# Patient Record
Sex: Female | Born: 1945 | Race: White | Hispanic: No | Marital: Married | Smoking: Current every day smoker
Health system: Southern US, Community
[De-identification: ages and names within clinical notes are randomized; demographics above are authoritative.]

## PROBLEM LIST (undated history)

## (undated) DIAGNOSIS — T7840XA Allergy, unspecified, initial encounter: Secondary | ICD-10-CM

## (undated) DIAGNOSIS — B019 Varicella without complication: Secondary | ICD-10-CM

## (undated) DIAGNOSIS — K529 Noninfective gastroenteritis and colitis, unspecified: Secondary | ICD-10-CM

## (undated) DIAGNOSIS — Z87440 Personal history of urinary (tract) infections: Secondary | ICD-10-CM

## (undated) DIAGNOSIS — E119 Type 2 diabetes mellitus without complications: Secondary | ICD-10-CM

## (undated) DIAGNOSIS — N8110 Cystocele, unspecified: Secondary | ICD-10-CM

## (undated) DIAGNOSIS — H269 Unspecified cataract: Secondary | ICD-10-CM

## (undated) DIAGNOSIS — M8580 Other specified disorders of bone density and structure, unspecified site: Secondary | ICD-10-CM

## (undated) DIAGNOSIS — E785 Hyperlipidemia, unspecified: Secondary | ICD-10-CM

## (undated) DIAGNOSIS — M810 Age-related osteoporosis without current pathological fracture: Secondary | ICD-10-CM

## (undated) DIAGNOSIS — E278 Other specified disorders of adrenal gland: Secondary | ICD-10-CM

## (undated) DIAGNOSIS — K5790 Diverticulosis of intestine, part unspecified, without perforation or abscess without bleeding: Secondary | ICD-10-CM

## (undated) DIAGNOSIS — J439 Emphysema, unspecified: Secondary | ICD-10-CM

## (undated) DIAGNOSIS — M1990 Unspecified osteoarthritis, unspecified site: Secondary | ICD-10-CM

## (undated) DIAGNOSIS — G709 Myoneural disorder, unspecified: Secondary | ICD-10-CM

## (undated) HISTORY — DX: Type 2 diabetes mellitus without complications: E11.9

## (undated) HISTORY — DX: Noninfective gastroenteritis and colitis, unspecified: K52.9

## (undated) HISTORY — DX: Age-related osteoporosis without current pathological fracture: M81.0

## (undated) HISTORY — DX: Unspecified cataract: H26.9

## (undated) HISTORY — DX: Unspecified osteoarthritis, unspecified site: M19.90

## (undated) HISTORY — DX: Allergy, unspecified, initial encounter: T78.40XA

## (undated) HISTORY — PX: POLYPECTOMY: SHX149

## (undated) HISTORY — PX: BREAST BIOPSY: SHX20

## (undated) HISTORY — DX: Emphysema, unspecified: J43.9

## (undated) HISTORY — DX: Other specified disorders of bone density and structure, unspecified site: M85.80

## (undated) HISTORY — DX: Hyperlipidemia, unspecified: E78.5

## (undated) HISTORY — DX: Diverticulosis of intestine, part unspecified, without perforation or abscess without bleeding: K57.90

## (undated) HISTORY — DX: Myoneural disorder, unspecified: G70.9

## (undated) HISTORY — DX: Other specified disorders of adrenal gland: E27.8

## (undated) HISTORY — PX: ABDOMINAL HYSTERECTOMY: SHX81

## (undated) HISTORY — DX: Varicella without complication: B01.9

## (undated) HISTORY — DX: Cystocele, unspecified: N81.10

## (undated) HISTORY — PX: TUBAL LIGATION: SHX77

## (undated) HISTORY — DX: Personal history of urinary (tract) infections: Z87.440

---
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
## 1983-06-25 HISTORY — PX: INDUCED ABORTION: SHX677

## 2005-11-06 HISTORY — PX: COLONOSCOPY: SHX174

## 2005-11-06 LAB — HM COLONOSCOPY: HM Colonoscopy: NORMAL

## 2012-06-24 DIAGNOSIS — Z87440 Personal history of urinary (tract) infections: Secondary | ICD-10-CM

## 2012-06-24 HISTORY — DX: Personal history of urinary (tract) infections: Z87.440

## 2012-07-23 LAB — HEMOGLOBIN A1C: HEMOGLOBIN A1C: 6.7 % — AB (ref 4.0–6.0)

## 2012-10-26 LAB — HM PAP SMEAR: HM Pap smear: NORMAL

## 2012-12-07 LAB — HM MAMMOGRAPHY: HM MAMMO: NORMAL

## 2013-03-30 LAB — HM DIABETES EYE EXAM

## 2013-06-07 LAB — BASIC METABOLIC PANEL
BUN: 19 mg/dL (ref 4–21)
CREATININE: 6.8 mg/dL — AB (ref <=1.1)
Creatinine: 0.7 mg/dL (ref <=1.1)
GLUCOSE: 129 mg/dL
POTASSIUM: 4.4 mmol/L (ref 3.4–5.3)
SODIUM: 142 mmol/L (ref 137–147)

## 2013-06-07 LAB — HEPATIC FUNCTION PANEL
ALT: 16 U/L (ref 7–35)
AST: 14 U/L (ref 13–35)
Alkaline Phosphatase: 108 U/L (ref 25–125)

## 2013-06-07 LAB — LIPID PANEL
HDL: 45 mg/dL (ref 35–70)
LDL Cholesterol: 60 mg/dL
Triglycerides: 160 mg/dL (ref 40–160)

## 2013-06-07 LAB — TSH: TSH: 1.24 u[IU]/mL (ref <=5.90)

## 2013-09-07 HISTORY — PX: BLADDER SURGERY: SHX569

## 2013-09-07 HISTORY — PX: ENTEROCELE REPAIR: SHX623

## 2013-09-07 HISTORY — PX: PUBOVAGINAL SLING: SHX1035

## 2013-09-07 HISTORY — PX: LAPAROSCOPIC BILATERAL SALPINGO OOPHERECTOMY: SHX5890

## 2013-09-07 HISTORY — PX: ABDOMINAL SACROCOLPOPEXY: SHX1114

## 2013-09-07 HISTORY — PX: CYSTOSCOPY: SUR368

## 2013-09-07 HISTORY — PX: ABDOMINAL HYSTERECTOMY: SHX81

## 2013-10-12 LAB — HM DIABETES FOOT EXAM: HM Diabetic Foot Exam: NORMAL

## 2014-02-24 ENCOUNTER — Ambulatory Visit (INDEPENDENT_AMBULATORY_CARE_PROVIDER_SITE_OTHER): Payer: Medicare HMO | Admitting: Family Medicine

## 2014-02-24 ENCOUNTER — Encounter: Payer: Self-pay | Admitting: Family Medicine

## 2014-02-24 VITALS — BP 120/72 | HR 72 | Temp 98.6°F | Ht 69.0 in | Wt 185.0 lb

## 2014-02-24 DIAGNOSIS — Z23 Encounter for immunization: Secondary | ICD-10-CM

## 2014-02-24 DIAGNOSIS — N8110 Cystocele, unspecified: Secondary | ICD-10-CM | POA: Insufficient documentation

## 2014-02-24 DIAGNOSIS — E7800 Pure hypercholesterolemia, unspecified: Secondary | ICD-10-CM

## 2014-02-24 DIAGNOSIS — E1149 Type 2 diabetes mellitus with other diabetic neurological complication: Secondary | ICD-10-CM | POA: Insufficient documentation

## 2014-02-24 DIAGNOSIS — B019 Varicella without complication: Secondary | ICD-10-CM

## 2014-02-24 DIAGNOSIS — Z720 Tobacco use: Secondary | ICD-10-CM

## 2014-02-24 DIAGNOSIS — R32 Unspecified urinary incontinence: Secondary | ICD-10-CM | POA: Insufficient documentation

## 2014-02-24 DIAGNOSIS — F17200 Nicotine dependence, unspecified, uncomplicated: Secondary | ICD-10-CM

## 2014-02-24 DIAGNOSIS — J302 Other seasonal allergic rhinitis: Secondary | ICD-10-CM | POA: Insufficient documentation

## 2014-02-24 DIAGNOSIS — K5790 Diverticulosis of intestine, part unspecified, without perforation or abscess without bleeding: Secondary | ICD-10-CM

## 2014-02-24 DIAGNOSIS — E785 Hyperlipidemia, unspecified: Secondary | ICD-10-CM

## 2014-02-24 DIAGNOSIS — K5732 Diverticulitis of large intestine without perforation or abscess without bleeding: Secondary | ICD-10-CM | POA: Insufficient documentation

## 2014-02-24 DIAGNOSIS — E1169 Type 2 diabetes mellitus with other specified complication: Secondary | ICD-10-CM | POA: Insufficient documentation

## 2014-02-24 DIAGNOSIS — E119 Type 2 diabetes mellitus without complications: Secondary | ICD-10-CM

## 2014-02-24 HISTORY — DX: Diverticulosis of intestine, part unspecified, without perforation or abscess without bleeding: K57.90

## 2014-02-24 HISTORY — DX: Varicella without complication: B01.9

## 2014-02-24 MED ORDER — LISINOPRIL 5 MG PO TABS: 5.0000 mg | ORAL_TABLET | Freq: Every day | ORAL | Status: DC

## 2014-02-24 MED ORDER — METFORMIN HCL 500 MG PO TABS: 500.0000 mg | ORAL_TABLET | Freq: Three times a day (TID) | ORAL | Status: DC

## 2014-02-24 MED ORDER — ATORVASTATIN CALCIUM 20 MG PO TABS: 20.0000 mg | ORAL_TABLET | Freq: Every day | ORAL | Status: DC

## 2014-02-24 NOTE — Patient Instructions (Addendum)
Wonderful to meet yoU!   A1c today.   Call [facility]-they can do bone density and mammogram for you (hopefully 1 visit!)  Refilled all medicines  Flu shot in October or Nov (call for flu clinic)  See me in December or January (Devika may be better so we can do the diabetic shoe forms)  Smoking-consider 21mg  patch and 2mg  gum combination then scale back every month or so to 14 mg then 7 mg patch

## 2014-02-24 NOTE — Assessment & Plan Note (Addendum)
Previous well-controlled with A1c of 6.2. Check A1c today. Continue Metformin 1.5 g daily. Lisinopril 5 mg for renal protection.

## 2014-02-24 NOTE — Progress Notes (Signed)
Samantha Reddish, MD Phone: [phone]  Subjective:  Patient presents today to establish care. Chief complaint-noted.   Hyperlipidemia On statin: Crestor Regular exercise: Yes at the [facility] ROS- no chest pain or shortness of breath. No myalgias  DIABETES Type II Medications taking and tolerating-yes Blood Sugars per patient-does not check Diet-tries to watch her carbs Regular Exercise-yes as above On Aspirin-yes On statin-yes Daily foot monitoring-yes, wears diabetic shoes and will need new shoes in January  ROS- Denies Polyuria,Polydipsia, nocturia, Vision changes, feet or hand numbness/pain/tingling. Denies  Hypoglycemia symptoms (shaky, sweaty, hungry, weak anxious, tremor, palpitations, confusion, behavior change).   Tobacco abuse One pack per day for many many years. Has tried Chantix and patch and unable to quit. Rates important as 10/10. She is not ready to quit. Low confidence in quitting  ROS-no shortness of breath or chest pain  The following were reviewed and entered/updated in epic: Past Medical History  Diagnosis Date  . Allergy   . Diabetes mellitus without complication   . Hyperlipidemia   . Chicken pox 02/24/2014    Has had shingles vaccine  . Diverticulosis 02/24/2014  . Vaginal prolapse     Status post surgery. Improved urinary incontinence  . Chronic diarrhea     For one year-needed prolonged course of antibiotics  . 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 Diabetes 02/24/2014    Priority: High  . Tobacco abuse 02/24/2014    Priority: High  . High cholesterol 02/24/2014    Priority: Medium  . Seasonal allergies 02/24/2014    Priority: Low   Past Surgical History  Procedure Laterality Date  . Tubal ligation    . Induced abortion  1985    Therapeutic  . Tonsillectomy Bilateral 1955  . Abdominal hysterectomy    . Bladder surgery  09/07/2013    Family History  Problem Relation  Age of Onset  . Cancer Father     Lung but was a smoker  . Stroke Father     41 massive lead to death  . Diabetes Father   . Diabetes Maternal Grandmother   . Osteoporosis Mother   . Bladder Cancer Father     Medications- reviewed and updated Current Outpatient Prescriptions  Medication Sig Dispense Refill  . aspirin 81 MG tablet Take 81 mg by mouth daily.      [name] atorvastatin (LIPITOR) 20 MG tablet Take 1 tablet (20 mg total) by mouth daily.  30 tablet  11  . Calcium Carbonate-Vit D-Min (CALCIUM 1200 PO) Take 2 tablets by mouth.      . Cholecalciferol (VITAMIN D3) 2000 UNITS TABS Take 1 tablet by mouth.      . fexofenadine (ALLEGRA) 180 MG tablet Take 180 mg by mouth daily.      [name] ibuprofen (ADVIL,MOTRIN) 400 MG tablet Take 400 mg by mouth every 6 (six) hours as needed.      [name] lisinopril (PRINIVIL,ZESTRIL) 5 MG tablet Take 1 tablet (5 mg total) by mouth daily.  30 tablet  11  . Magnesium 500 MG CAPS Take 1 capsule by mouth.      . metFORMIN (GLUCOPHAGE) 500 MG tablet Take 1 tablet (500 mg total) by mouth 3 (three) times daily before meals.  90 tablet  11  . Multiple Vitamins-Minerals (MULTIVITAMIN PO) Take 1 tablet by mouth.      . Omega-3 Fatty Acids (FISH OIL PO) Take 1 tablet  by mouth.      . Polyethylene Glycol 3350 (MIRALAX PO) Take by mouth.      . Probiotic Product (PROBIOTIC DAILY PO) Take 1 tablet by mouth.      . Wheat Dextrin (BENEFIBER PO) Take 1 tablet by mouth.       No current facility-administered medications for this visit.    Allergies-reviewed and updated No Known Allergies  History   Social History  . Marital Status: Married    Spouse Name: N/A    Number of Children: N/A  . Years of Education: N/A   Social History Main Topics  . Smoking status: Current Every Day Smoker -- 1.00 packs/day    Types: Cigarettes  . Smokeless tobacco: Never Used  . Alcohol Use: No  . Drug Use: No  . Sexual Activity: None   Other Topics Concern  . None   Social  History Narrative   Married 45 years in 2015.  Moved to [place] to be near daughter who lives here. Has a son as well and 92 year old grandson. 3 granddogs. One dog at home       Retired former  Academic librarian. Has a BS in biology from [employer]. Used to be an algologist-studied algae under microscope      Hobbies-exercising with her husband          ROS--See HPI , otherwise full ROS was completed and negative except as noted above  Objective: BP 120/72  Pulse 72  Temp(Src) 98.6 F (37 C)  Ht 5\' 9"  (1.753 m)  Wt 185 lb (83.915 kg)  BMI 27.31 kg/m2 Gen: NAD, resting comfortably on table, smells of smoke Eyes: PERRLA, no lid lag, sclera and conjunctiva normal HEENT: Mucous membranes are moist. Oropharynx normal. Good dentition. Neck: no thyromegaly , no lymphadenopathy CV: RRR no murmurs rubs or gallops Lungs: CTAB no crackles, wheeze, rhonchi Abdomen: soft/nontender/nondistended/normal bowel sounds. No rebound or guarding.  Ext: Trace edema on the right leg-chronic issue per patient though denies history of injury to right leg Skin: warm, dry, no rash Neuro: 5/5 strength upper and lower extremities, 2+ reflexes Diabetic foot exam-no monofilament use today, calluses noted on great toe bilaterally  Assessment/Plan:  Diabetes Previous well-controlled with A1c of 6.2. Check A1c today. Continue Metformin 1.5 g daily. Lisinopril 5 mg for renal protection.  High cholesterol Last set of lipids well-controlled with LDL less than 100. Continue Lipitor 20 mg daily.  Tobacco abuse Encourage cessation. Trial patch and gum combination. Encouraged to call the quit line. Follow up 3 months. Encouraged every visit   sent the patient to the breast center for DEXA and mammogram. Patient brought records from previous office-will review and scan in where appropriate.  Orders Placed This Encounter  Procedures  . Pneumococcal polysaccharide vaccine 23-valent greater than or equal to  2yo subcutaneous/IM  . Hemoglobin A1c    [place]

## 2014-02-24 NOTE — Assessment & Plan Note (Signed)
Last set of lipids well-controlled with LDL less than 100. Continue Lipitor 20 mg daily.

## 2014-02-24 NOTE — Assessment & Plan Note (Signed)
Encourage cessation. Trial patch and gum combination. Encouraged to call the quit line. Follow up 3 months. Encouraged every visit

## 2014-02-25 ENCOUNTER — Encounter: Payer: Self-pay | Admitting: Family Medicine

## 2014-02-25 ENCOUNTER — Other Ambulatory Visit: Payer: Self-pay

## 2014-02-25 LAB — HEMOGLOBIN A1C: Hgb A1c MFr Bld: 7 % — ABNORMAL HIGH (ref 4.6–6.5)

## 2014-02-25 MED ORDER — LISINOPRIL 5 MG PO TABS: 5.0000 mg | ORAL_TABLET | Freq: Every day | ORAL | Status: DC

## 2014-02-25 MED ORDER — METFORMIN HCL 500 MG PO TABS: 500.0000 mg | ORAL_TABLET | Freq: Three times a day (TID) | ORAL | Status: DC

## 2014-02-25 MED ORDER — ATORVASTATIN CALCIUM 20 MG PO TABS: 20.0000 mg | ORAL_TABLET | Freq: Every day | ORAL | Status: DC

## 2014-03-01 ENCOUNTER — Telehealth: Payer: Self-pay | Admitting: Family Medicine

## 2014-03-01 MED ORDER — METFORMIN HCL 500 MG PO TABS: 500.0000 mg | ORAL_TABLET | Freq: Three times a day (TID) | ORAL | Status: DC

## 2014-03-01 NOTE — Telephone Encounter (Signed)
Pt called to say that she is suppose to get a 90 day supply which would be 270 pills. She only got 90 pills which is for 1 month supply. She takes this pill 3 days    metFORMIN (GLUCOPHAGE) 500 MG tablet   Pharmacy [facility]

## 2014-03-01 NOTE — Telephone Encounter (Signed)
Medication refilled and pt notified.

## 2014-03-02 ENCOUNTER — Telehealth: Payer: Self-pay | Admitting: Family Medicine

## 2014-03-02 DIAGNOSIS — Z13820 Encounter for screening for osteoporosis: Secondary | ICD-10-CM

## 2014-03-02 NOTE — Telephone Encounter (Signed)
Order put in.

## 2014-03-02 NOTE — Telephone Encounter (Signed)
Is this ok to order Dr. Yong Channel? If so what Dx to use?

## 2014-03-02 NOTE — Telephone Encounter (Signed)
done

## 2014-03-02 NOTE — Telephone Encounter (Signed)
Pt would like to go to [facility] [place]

## 2014-03-02 NOTE — Telephone Encounter (Signed)
Pt states she was to have a bone density. Can you put the order in.?

## 2014-03-03 ENCOUNTER — Other Ambulatory Visit: Payer: Self-pay

## 2014-03-03 ENCOUNTER — Other Ambulatory Visit: Payer: Self-pay | Admitting: Family Medicine

## 2014-03-03 DIAGNOSIS — Z1231 Encounter for screening mammogram for malignant neoplasm of breast: Secondary | ICD-10-CM

## 2014-03-03 DIAGNOSIS — Z780 Asymptomatic menopausal state: Secondary | ICD-10-CM

## 2014-03-08 ENCOUNTER — Encounter: Payer: Self-pay | Admitting: Family Medicine

## 2014-03-08 ENCOUNTER — Telehealth: Payer: Self-pay | Admitting: Family Medicine

## 2014-03-08 DIAGNOSIS — E1159 Type 2 diabetes mellitus with other circulatory complications: Secondary | ICD-10-CM | POA: Insufficient documentation

## 2014-03-08 DIAGNOSIS — I10 Essential (primary) hypertension: Secondary | ICD-10-CM | POA: Insufficient documentation

## 2014-03-08 NOTE — Telephone Encounter (Signed)
I asked patient to schedule an appointment when I talked with her. Ask her to bring the metformin bottles with her.

## 2014-03-08 NOTE — Telephone Encounter (Signed)
Spoke with Dr. Yong Channel and it is ok for pt to switch to 1000mg  twice a day

## 2014-03-08 NOTE — Telephone Encounter (Signed)
Pt states she has been keeping a close watch on her sugars and her average on her meter is reading 135. She states in the mornings she is running 159, 129, 133 and she states she feels that these numbers are to high for fasting and before lunch she is reading 169, 114 and before bed she is having high readings as well last night it was 138, so pt states she will like to increase her metformin. She wants her A1c to be under 7.0, she says she has an extra bottle of metformin that she can use for a month to see how the 1000mg  will work for her. Please Advise

## 2014-03-08 NOTE — Telephone Encounter (Signed)
Pt called and stated that since her A1C was high Dr Yong Channel mentioned adding an addition medication. Pt would like to proceed with adding med and discuss her glucometer readings since last visit.

## 2014-03-09 ENCOUNTER — Encounter: Payer: Self-pay | Admitting: Family Medicine

## 2014-03-09 LAB — CBC: TSH: 2.318

## 2014-03-14 ENCOUNTER — Telehealth: Payer: Self-pay

## 2014-03-14 NOTE — Telephone Encounter (Signed)
Great news. Thanks for sharing.

## 2014-03-14 NOTE — Telephone Encounter (Signed)
Pt states her sugars have been running really well since she has been doing the 1000mg  of metformin twice a day. Her numbers have been ranging between 95-126 fasting and before lunch and dinner running between 117-121. Just an [place]

## 2014-03-17 ENCOUNTER — Ambulatory Visit (INDEPENDENT_AMBULATORY_CARE_PROVIDER_SITE_OTHER): Payer: Medicare HMO

## 2014-03-17 DIAGNOSIS — Z23 Encounter for immunization: Secondary | ICD-10-CM

## 2014-03-30 ENCOUNTER — Ambulatory Visit
Admission: RE | Admit: 2014-03-30 | Discharge: 2014-03-30 | Disposition: A | Discharge status: Home / Self Care | Payer: Medicare HMO | Source: Ambulatory Visit

## 2014-03-30 ENCOUNTER — Encounter: Payer: Self-pay | Admitting: Family Medicine

## 2014-03-30 ENCOUNTER — Ambulatory Visit
Admission: RE | Admit: 2014-03-30 | Discharge: 2014-03-30 | Disposition: A | Discharge status: Home / Self Care | Payer: Medicare HMO | Source: Ambulatory Visit | Attending: Family Medicine | Admitting: Family Medicine

## 2014-03-30 DIAGNOSIS — Z1231 Encounter for screening mammogram for malignant neoplasm of breast: Secondary | ICD-10-CM

## 2014-03-30 DIAGNOSIS — M810 Age-related osteoporosis without current pathological fracture: Secondary | ICD-10-CM | POA: Insufficient documentation

## 2014-03-30 DIAGNOSIS — M8580 Other specified disorders of bone density and structure, unspecified site: Secondary | ICD-10-CM | POA: Insufficient documentation

## 2014-03-30 DIAGNOSIS — Z780 Asymptomatic menopausal state: Secondary | ICD-10-CM

## 2014-03-30 LAB — HM DEXA SCAN

## 2014-04-06 ENCOUNTER — Encounter: Payer: Self-pay | Admitting: Family Medicine

## 2014-04-29 ENCOUNTER — Encounter: Payer: Self-pay | Admitting: Family Medicine

## 2014-06-28 ENCOUNTER — Encounter: Payer: Self-pay | Admitting: Family Medicine

## 2014-06-28 ENCOUNTER — Telehealth: Payer: Self-pay | Admitting: Family Medicine

## 2014-06-28 ENCOUNTER — Ambulatory Visit (INDEPENDENT_AMBULATORY_CARE_PROVIDER_SITE_OTHER): Payer: Medicare HMO | Admitting: Family Medicine

## 2014-06-28 VITALS — BP 102/88 | Temp 98.4°F | Wt 182.0 lb

## 2014-06-28 DIAGNOSIS — E7800 Pure hypercholesterolemia, unspecified: Secondary | ICD-10-CM

## 2014-06-28 DIAGNOSIS — Z720 Tobacco use: Secondary | ICD-10-CM

## 2014-06-28 DIAGNOSIS — E1149 Type 2 diabetes mellitus with other diabetic neurological complication: Secondary | ICD-10-CM

## 2014-06-28 LAB — HEMOGLOBIN A1C: Hgb A1c MFr Bld: 7 % — ABNORMAL HIGH (ref 4.6–6.5)

## 2014-06-28 MED ORDER — LISINOPRIL 5 MG PO TABS: 5.0000 mg | ORAL_TABLET | Freq: Every day | ORAL | Status: DC

## 2014-06-28 MED ORDER — GLUCOSE BLOOD VI STRP: ORAL_STRIP | Status: DC

## 2014-06-28 MED ORDER — ACCU-CHEK FASTCLIX LANCETS MISC: Status: DC

## 2014-06-28 MED ORDER — ATORVASTATIN CALCIUM 20 MG PO TABS: 20.0000 mg | ORAL_TABLET | Freq: Every day | ORAL | Status: DC

## 2014-06-28 MED ORDER — METFORMIN HCL 1000 MG PO TABS: 1000.0000 mg | ORAL_TABLET | Freq: Two times a day (BID) | ORAL | Status: DC

## 2014-06-28 NOTE — Assessment & Plan Note (Signed)
Controlled. Neuropathy with burning/tingling at night. Calluses on bilateral great toes as well as hammer toes with slight callus. Needs to continue diabetic shoes. Repeat a1c today. Continue metformin 2g daily.

## 2014-06-28 NOTE — Telephone Encounter (Signed)
Pt would like you to know she had one refill of her diabetic supplies left and [facility]  She will not need anything until she comes back in May.

## 2014-06-28 NOTE — Patient Instructions (Signed)
Filled out diabetic shoe forms  a1c today. Hopeful a1c lower. Continue 1g metformin twice a day.   Follow up 4 months.   Great job on desire to quit smoking!!!! Quit date in February.  See me 1 month after quit date  21 mg patch with 2mg  lozenge (6 per day max) for 3 weeks  14 mg patch with 2mg  lozenge (6 per day max) for 3 weeks  7 mg patch with 2mg  lozenge (6 per day max) for 3 weeeks No patch with 2mg  lozenge (6 per day max) for 3 weeks  Within 3 months you will be cigarette free!

## 2014-06-28 NOTE — Assessment & Plan Note (Signed)
12 minutes spent on smoking cessation counseling.

## 2014-06-28 NOTE — Progress Notes (Signed)
Garret Reddish, MD Phone: [phone]  Subjective:   Samantha Snyder is a 69 y.o. year old very pleasant female patient who presents with the following:  DIABETES Type II-controlled  Lab Results  Component Value Date   HGBA1C 7.0* 02/24/2014   HGBA1C 6.7* 07/23/2012  Presents to discuss diabetic shoes. Has calluses on big toe both feet.   Medications taking and tolerating-yes, metformin 4x daily before meals Blood Sugars per patient-on 3x a day 122-160, 115-140 with increasing metformin dose Diet-trying to watch carbs Regular Exercise-some health problems with husband and then was ill over Christmas but wants to start back. On Aspirin-yes On statin-yes Daily foot monitoring-yes  Eye exam was normal in October  Endorses numbness/tingling in toes back to mid foot. Pain at night  ROS- Denies Polyuria,Polydipsia, nocturia (once per night), Vision changes.  Denies  Hypoglycemia symptoms (shaky, sweaty, hungry, weak anxious, tremor, palpitations, confusion, behavior change).   Tobacco abuse 1 ppd. Wants to quit. February quit date.   Past Medical History- Patient Active Problem List   Diagnosis Date Noted  . DM (diabetes mellitus) type II controlled, neurological manifestation 02/24/2014    Priority: High  . Tobacco abuse 02/24/2014    Priority: High  . Osteopenia 03/30/2014    Priority: Medium  . Essential hypertension, benign 03/08/2014    Priority: Medium  . High cholesterol 02/24/2014    Priority: Medium  . Seasonal allergies 02/24/2014    Priority: Low   Medications- reviewed and updated Current Outpatient Prescriptions  Medication Sig Dispense Refill  . aspirin 81 MG tablet Take 81 mg by mouth daily.    [name] atorvastatin (LIPITOR) 20 MG tablet Take 1 tablet (20 mg total) by mouth daily. 90 tablet 1  . Calcium Carbonate-Vit D-Min (CALCIUM 1200 PO) Take 2 tablets by mouth.    . Cholecalciferol (VITAMIN D3) 2000 UNITS TABS Take 1 tablet by mouth.    . fexofenadine  (ALLEGRA) 180 MG tablet Take 180 mg by mouth daily.    [name] ibuprofen (ADVIL,MOTRIN) 400 MG tablet Take 400 mg by mouth every 6 (six) hours as needed.    [name] lisinopril (PRINIVIL,ZESTRIL) 5 MG tablet Take 1 tablet (5 mg total) by mouth daily. 90 tablet 1  . Magnesium 500 MG CAPS Take 1 capsule by mouth.    . metFORMIN (GLUCOPHAGE) 500 MG tablet Take 1 tablet (500 mg total) by mouth 3 (three) times daily before meals. 270 tablet 1  . Multiple Vitamins-Minerals (MULTIVITAMIN PO) Take 1 tablet by mouth.    . Omega-3 Fatty Acids (FISH OIL PO) Take 1 tablet by mouth.    . Polyethylene Glycol 3350 (MIRALAX PO) Take by mouth.    . Probiotic Product (PROBIOTIC DAILY PO) Take 1 tablet by mouth.    . Wheat Dextrin (BENEFIBER PO) Take 1 tablet by mouth.     Objective: BP 102/88 mmHg  Temp(Src) 98.4 F (36.9 C)  Wt 182 lb (82.555 kg) Gen: NAD, resting comfortably CV: RRR no murmurs rubs or gallops Lungs: CTAB no crackles, wheeze, rhonchi Ext: no edema Skin: warm, dry, no rash-foot exam below Neuro: grossly normal, moves all extremities Diabetic Foot Exam - Simple   Simple Foot Form  Visual Inspection  See comments:  Yes  Sensation Testing  Intact to touch and monofilament testing bilaterally:  Yes  Pulse Check  Posterior Tibialis and Dorsalis pulse intact bilaterally:  Yes  Comments  Callus on bilateral great toe at IP joint, bilateral hammer toes 2nd toe with slight callus on top.  Assessment/Plan:  DM (diabetes mellitus) type II controlled, neurological manifestation Controlled. Neuropathy with burning/tingling at night. Calluses on bilateral great toes as well as hammer toes with slight callus. Needs to continue diabetic shoes. Repeat a1c today. Continue metformin 2g daily.   Tobacco abuse 12 minutes spent on smoking cessation counseling.   Follow up 4 months  Orders Placed This Encounter  Procedures  . Hemoglobin A1c    [place]   Meds ordered this encounter  Medications  .  ACCU-CHEK FASTCLIX LANCETS MISC    Sig: Check blood sugars daily. Dx:E11.9    Dispense:  100 each    Refill:  10  . glucose blood (ACCU-CHEK SMARTVIEW) test strip    Sig: Check blood sugars daily    Dispense:  100 each    Refill:  10  . atorvastatin (LIPITOR) 20 MG tablet    Sig: Take 1 tablet (20 mg total) by mouth daily.    Dispense:  90 tablet    Refill:  3  . metFORMIN (GLUCOPHAGE) 1000 MG tablet    Sig: Take 1 tablet (1,000 mg total) by mouth 2 (two) times daily with a meal.    Dispense:  180 tablet    Refill:  3  . lisinopril (PRINIVIL,ZESTRIL) 5 MG tablet    Sig: Take 1 tablet (5 mg total) by mouth daily.    Dispense:  90 tablet    Refill:  3

## 2014-08-11 ENCOUNTER — Telehealth: Payer: Self-pay | Admitting: Family Medicine

## 2014-08-11 NOTE — Telephone Encounter (Signed)
Usually I refer to podiatry. Keba-would you mind asking Apolonio Schneiders and Butch Penny and see if they use any other places? You can place referral

## 2014-08-11 NOTE — Telephone Encounter (Signed)
Pt would like to know the name of the place you mentioned to her that supplies diabetic shoes b/c the people at [facility] are not helping her or listening to her and giving her shoes that are hurting her more than helping her.

## 2014-08-11 NOTE — Telephone Encounter (Signed)
Pt would like to know if you have a referral place for diabetic shoes.  Pt states the place she was going to is not working out. Pt does not need foot doctor, just the diabetic shoes

## 2014-08-12 ENCOUNTER — Ambulatory Visit (INDEPENDENT_AMBULATORY_CARE_PROVIDER_SITE_OTHER): Payer: Medicare HMO | Admitting: Internal Medicine

## 2014-08-12 ENCOUNTER — Encounter: Payer: Self-pay | Admitting: Internal Medicine

## 2014-08-12 VITALS — BP 110/60 | Temp 98.8°F | Wt 186.0 lb

## 2014-08-12 DIAGNOSIS — E1149 Type 2 diabetes mellitus with other diabetic neurological complication: Secondary | ICD-10-CM

## 2014-08-12 DIAGNOSIS — Z720 Tobacco use: Secondary | ICD-10-CM

## 2014-08-12 DIAGNOSIS — I10 Essential (primary) hypertension: Secondary | ICD-10-CM

## 2014-08-12 DIAGNOSIS — L723 Sebaceous cyst: Secondary | ICD-10-CM

## 2014-08-12 NOTE — Telephone Encounter (Signed)
Spoke with pt and she is not wanting to see a podiatrist. She is just needing diabetic shoes, she has spoken with [facility] and [facility] and will f/u with [place] next week.

## 2014-08-12 NOTE — Telephone Encounter (Signed)
See my note below

## 2014-08-12 NOTE — Patient Instructions (Signed)
Call or return to clinic prn if these symptoms worsen or fail to improve as anticipated.  Smoking tobacco is very bad for your health. You should stop smoking immediately.   Please check your hemoglobin A1c every 3 months

## 2014-08-12 NOTE — Progress Notes (Signed)
Subjective:    Patient ID: Samantha Snyder, female    DOB: 11-25-1945, 69 y.o.   MRN: [id number]  HPI  69 year old patient who stable medical problems include diabetes dyslipidemia, hypertension and ongoing tobacco use. She has noted recently a painless nodule involving the left lateral elbow area.  This has not changed over the several days that she first noticed this area.  She states she did fall last month on the ice sustaining trauma to this area.  Past Medical History  Diagnosis Date  . Allergy   . Diabetes mellitus without complication   . Hyperlipidemia   . Chicken pox 02/24/2014    Has had shingles vaccine  . Diverticulosis 02/24/2014  . Vaginal prolapse     Status post surgery. Improved urinary incontinence  . Chronic diarrhea     For one year-needed prolonged course of antibiotics  . Right adrenal mass     Biopsied in 2008 and benign  . History of UTI     05/2013    History   Social History  . Marital Status: Married    Spouse Name: N/A  . Number of Children: N/A  . Years of Education: N/A   Occupational History  . Not on file.   Social History Main Topics  . Smoking status: Current Every Day Smoker -- 1.00 packs/day    Types: Cigarettes  . Smokeless tobacco: Never Used  . Alcohol Use: No  . Drug Use: No  . Sexual Activity: Not on file   Other Topics Concern  . Not on file   Social History Narrative   Married 45 years in 2015.  Moved to [place] to be near daughter who lives here. Has a son as well and 36 year old grandson. 3 granddogs. 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 Used to be an algologist-studied algae under microscope      Hobbies-exercising with her husband          Past Surgical History  Procedure Laterality Date  . Tubal ligation    . Induced abortion  1985    Therapeutic  . Tonsillectomy Bilateral 1955  . Abdominal hysterectomy    . Bladder surgery  09/07/2013    vaginal  prolapse    Family History  Problem Relation Age of Onset  . Cancer Father     Lung but was a smoker  . Stroke Father     23 massive lead to death  . Diabetes Father   . Diabetes Maternal Grandmother   . Osteoporosis Mother   . Bladder Cancer Father     No Known Allergies  Current Outpatient Prescriptions on File Prior to Visit  Medication Sig Dispense Refill  . ACCU-CHEK FASTCLIX LANCETS MISC Check blood sugars daily. Dx:E11.9 100 each 10  . aspirin 81 MG tablet Take 81 mg by mouth daily.    [name] atorvastatin (LIPITOR) 20 MG tablet Take 1 tablet (20 mg total) by mouth daily. 90 tablet 3  . Calcium Carbonate-Vit D-Min (CALCIUM 1200 PO) Take 2 tablets by mouth.    . Cholecalciferol (VITAMIN D3) 2000 UNITS TABS Take 1 tablet by mouth.    . fexofenadine (ALLEGRA) 180 MG tablet Take 180 mg by mouth daily.    [name] glucose blood (ACCU-CHEK SMARTVIEW) test strip Check blood sugars daily 100 each 10  . ibuprofen (ADVIL,MOTRIN) 400 MG tablet Take 400 mg by mouth every 6 (six) hours as  needed.    [name] lisinopril (PRINIVIL,ZESTRIL) 5 MG tablet Take 1 tablet (5 mg total) by mouth daily. 90 tablet 3  . Magnesium 500 MG CAPS Take 1 capsule by mouth.    . metFORMIN (GLUCOPHAGE) 1000 MG tablet Take 1 tablet (1,000 mg total) by mouth 2 (two) times daily with a meal. 180 tablet 3  . Multiple Vitamins-Minerals (MULTIVITAMIN PO) Take 1 tablet by mouth.    . Omega-3 Fatty Acids (FISH OIL PO) Take 1 tablet by mouth.    . Polyethylene Glycol 3350 (MIRALAX PO) Take by mouth.    . Probiotic Product (PROBIOTIC DAILY PO) Take 1 tablet by mouth.    . Wheat Dextrin (BENEFIBER PO) Take 1 tablet by mouth.     No current facility-administered medications on file prior to visit.    BP 110/60 mmHg  Temp(Src) 98.8 F (37.1 C)  Wt 186 lb (84.369 kg)    Review of Systems  Skin: Positive for wound.       Objective:   Physical Exam  Constitutional: She appears well-developed and well-nourished. No distress.    Skin:  2 cm freely movable subcutaneous nodule, noninflamed, nontender over the left lateral epicondyle          Assessment & Plan:   Benign lesion, left lateral elbow.  Most consistent with a sebaceous cyst.  Patient reassured.  We will clinically observe at this time Diabetes mellitus.  Last hemoglobin A1c 7.0 Tobacco abuse.  Patient has picked a smoking cessation date.  In March

## 2014-08-19 ENCOUNTER — Other Ambulatory Visit: Payer: Self-pay | Admitting: Family Medicine

## 2014-10-27 ENCOUNTER — Ambulatory Visit (INDEPENDENT_AMBULATORY_CARE_PROVIDER_SITE_OTHER): Payer: Medicare PPO | Admitting: Family Medicine

## 2014-10-27 ENCOUNTER — Encounter: Payer: Self-pay | Admitting: Family Medicine

## 2014-10-27 VITALS — BP 122/64 | HR 74 | Temp 99.0°F | Wt 186.0 lb

## 2014-10-27 DIAGNOSIS — E1140 Type 2 diabetes mellitus with diabetic neuropathy, unspecified: Secondary | ICD-10-CM

## 2014-10-27 DIAGNOSIS — E7800 Pure hypercholesterolemia, unspecified: Secondary | ICD-10-CM

## 2014-10-27 DIAGNOSIS — E1342 Other specified diabetes mellitus with diabetic polyneuropathy: Secondary | ICD-10-CM

## 2014-10-27 DIAGNOSIS — Z720 Tobacco use: Secondary | ICD-10-CM

## 2014-10-27 DIAGNOSIS — I10 Essential (primary) hypertension: Secondary | ICD-10-CM

## 2014-10-27 DIAGNOSIS — E1149 Type 2 diabetes mellitus with other diabetic neurological complication: Secondary | ICD-10-CM

## 2014-10-27 DIAGNOSIS — G629 Polyneuropathy, unspecified: Secondary | ICD-10-CM

## 2014-10-27 DIAGNOSIS — E1142 Type 2 diabetes mellitus with diabetic polyneuropathy: Secondary | ICD-10-CM

## 2014-10-27 LAB — LIPID PANEL
CHOL/HDL RATIO: 3
Cholesterol: 124 mg/dL (ref 0–200)
HDL: 46.8 mg/dL (ref >39.00)
LDL Cholesterol: 52 mg/dL (ref 0–99)
NONHDL: 77.2
TRIGLYCERIDES: 124 mg/dL (ref 0.0–149.0)
VLDL: 24.8 mg/dL (ref 0.0–40.0)

## 2014-10-27 LAB — COMPREHENSIVE METABOLIC PANEL
ALT: 18 U/L (ref 0–35)
AST: 16 U/L (ref 0–37)
Albumin: 4 g/dL (ref 3.5–5.2)
Alkaline Phosphatase: 93 U/L (ref 39–117)
BILIRUBIN TOTAL: 0.4 mg/dL (ref 0.2–1.2)
BUN: 21 mg/dL (ref 6–23)
CHLORIDE: 107 meq/L (ref 96–112)
CO2: 26 mEq/L (ref 19–32)
CREATININE: 0.55 mg/dL (ref 0.40–1.20)
Calcium: 9.4 mg/dL (ref 8.4–10.5)
GFR: 117.1 mL/min (ref >60.00)
Glucose, Bld: 133 mg/dL — ABNORMAL HIGH (ref 70–99)
Potassium: 3.9 mEq/L (ref 3.5–5.1)
SODIUM: 139 meq/L (ref 135–145)
TOTAL PROTEIN: 6.7 g/dL (ref 6.0–8.3)

## 2014-10-27 LAB — HEMOGLOBIN A1C: Hgb A1c MFr Bld: 6.7 % — ABNORMAL HIGH (ref 4.6–6.5)

## 2014-10-27 NOTE — Patient Instructions (Addendum)
Update fasting labs today  No changes to medications as long as labs look ok  Follow up 4 months and we can repeat the insert forms

## 2014-10-27 NOTE — Assessment & Plan Note (Signed)
No rx. Deals with pain. Has diabetic shoes. Will get every 4 month inserts.

## 2014-10-27 NOTE — Assessment & Plan Note (Signed)
a1c down to 6.7. Continue Metformin 2 g daily.

## 2014-10-27 NOTE — Progress Notes (Signed)
Garret Reddish, MD  Subjective:   Samantha Snyder is a 69 y.o. year old very pleasant female patient who presents with the following:  DIABETES Type II-controlled  Diabetic neuropathy- stable - Endorses numbness/tingling in toes back to mid foot. Pain at night  - Asks for form to be faxed for diabetic shoe inserts. Calluses on big toe both feet. Also with hammer toes with slight callusHistory numbnes/tingling in toes back to mid foot with some pain at night.  Lab Results  Component Value Date   HGBA1C 6.7* 10/27/2014   HGBA1C 7.0* 06/28/2014   HGBA1C 7.0* 02/24/2014  Medications taking and tolerating-yes, metformin  Blood Sugars per patient-90-120 in AM  ROS- Denies Polyuria,Polydipsia, nocturia (once per night), Vision changes.  Denies  Hypoglycemia symptoms (shaky, sweaty, hungry, weak anxious, tremor, palpitations, confusion, behavior change).   Tobacco abuse quit for 1 month! Used to be 1 PPD. Using lozenges as well as 21 mg gum.   Hyperlipidemia-controlled  Lab Results  Component Value Date   LDLCALC 52 10/27/2014   On statin: yes Regular exercise: advised ROS- no chest pain or shortness of breath. No myalgias  Hypertension-controlled  BP Readings from Last 3 Encounters:  10/27/14 122/64  08/12/14 110/60  06/28/14 102/88  Compliant with medications-yes without side effects ROS-Denies any CP, HA, SOB, blurry vision, LE edema,  Past Medical History- Patient Active Problem List   Diagnosis Date Noted  . DM (diabetes mellitus) type II controlled, neurological manifestation 02/24/2014    Priority: High  . Tobacco abuse 02/24/2014    Priority: High  . Osteopenia 03/30/2014    Priority: Medium  . Essential hypertension, benign 03/08/2014    Priority: Medium  . High cholesterol 02/24/2014    Priority: Medium  . Diabetic peripheral neuropathy 10/27/2014    Priority: Low  . Seasonal allergies 02/24/2014    Priority: Low   Medications- reviewed and updated Current  Outpatient Prescriptions  Medication Sig Dispense Refill  . aspirin 81 MG tablet Take 81 mg by mouth daily.    [name] atorvastatin (LIPITOR) 20 MG tablet Take 1 tablet (20 mg total) by mouth daily. 90 tablet 1  . Calcium Carbonate-Vit D-Min (CALCIUM 1200 PO) Take 2 tablets by mouth.    . Cholecalciferol (VITAMIN D3) 2000 UNITS TABS Take 1 tablet by mouth.    . fexofenadine (ALLEGRA) 180 MG tablet Take 180 mg by mouth daily.    [name] ibuprofen (ADVIL,MOTRIN) 400 MG tablet Take 400 mg by mouth every 6 (six) hours as needed.    [name] lisinopril (PRINIVIL,ZESTRIL) 5 MG tablet Take 1 tablet (5 mg total) by mouth daily. 90 tablet 1  . Magnesium 500 MG CAPS Take 1 capsule by mouth.    . metFORMIN (GLUCOPHAGE) 500 MG tablet Take 1 tablet (500 mg total) by mouth 3 (three) times daily before meals. 270 tablet 1  . Multiple Vitamins-Minerals (MULTIVITAMIN PO) Take 1 tablet by mouth.    . Omega-3 Fatty Acids (FISH OIL PO) Take 1 tablet by mouth.    . Polyethylene Glycol 3350 (MIRALAX PO) Take by mouth.    . Probiotic Product (PROBIOTIC DAILY PO) Take 1 tablet by mouth.    . Wheat Dextrin (BENEFIBER PO) Take 1 tablet by mouth.     Objective: BP 122/64 mmHg  Pulse 74  Temp(Src) 99 F (37.2 C)  Wt 186 lb (84.369 kg) Gen: NAD, resting comfortably CV: RRR no murmurs rubs or gallops Lungs: CTAB no crackles, wheeze, rhonchi Ext: no edema Skin: warm, dry, no  rash-foot exam below unchanged from previous exam Neuro: grossly normal, moves all extremities  Diabetic Foot Exam - Simple   Simple Foot Form  Diabetic Foot exam was performed with the following findings:  Yes 10/27/2014 10:04 AM  Visual Inspection  See comments:  Yes  Sensation Testing  Intact to touch and monofilament testing bilaterally:  Yes  Pulse Check  Posterior Tibialis and Dorsalis pulse intact bilaterally:  Yes  Comments  Callus on bilateral great toe at IP joint, bilateral hammer toes 2nd toe with slight callus on top.  Monofilament sensation  more prominent proximally, less so at toes     Assessment/Plan:  DM (diabetes mellitus) type II controlled, neurological manifestation a1c down to 6.7. Continue Metformin 2 g daily.   Diabetic peripheral neuropathy No rx. Deals with pain. Has diabetic shoes. Will get every 4 month inserts.    Essential hypertension, benign Controlled, continue lisinopril   High cholesterol LDL 52 today. Continue lipitor 20mg  daily   Tobacco abuse Has only had 1-2 cigarettes over month. Doing well with lozenge/gum combo. Encouraged continued cessation.    Follow up 4 months  fasting Results for orders placed or performed in visit on 10/27/14 (from the past 24 hour(s))  Hemoglobin A1c     Status: Abnormal   Collection Time: 10/27/14 10:12 AM  Result Value Ref Range   Hgb A1c MFr Bld 6.7 (H) 4.6 - 6.5 %  Lipid Panel     Status: None   Collection Time: 10/27/14 10:12 AM  Result Value Ref Range   Cholesterol 124 0 - 200 mg/dL   Triglycerides 124.0 0.0 - 149.0 mg/dL   HDL 46.80 >39.00 mg/dL   VLDL 24.8 0.0 - 40.0 mg/dL   LDL Cholesterol 52 0 - 99 mg/dL   Total CHOL/HDL Ratio 3    NonHDL 77.20   Comprehensive metabolic panel     Status: Abnormal   Collection Time: 10/27/14 10:12 AM  Result Value Ref Range   Sodium 139 135 - 145 mEq/L   Potassium 3.9 3.5 - 5.1 mEq/L   Chloride 107 96 - 112 mEq/L   CO2 26 19 - 32 mEq/L   Glucose, Bld 133 (H) 70 - 99 mg/dL   BUN 21 6 - 23 mg/dL   Creatinine, Ser 0.55 0.40 - 1.20 mg/dL   Total Bilirubin 0.4 0.2 - 1.2 mg/dL   Alkaline Phosphatase 93 39 - 117 U/L   AST 16 0 - 37 U/L   ALT 18 0 - 35 U/L   Total Protein 6.7 6.0 - 8.3 g/dL   Albumin 4.0 3.5 - 5.2 g/dL   Calcium 9.4 8.4 - 10.5 mg/dL   GFR 117.10 >60.00 mL/min

## 2014-10-27 NOTE — Assessment & Plan Note (Signed)
Has only had 1-2 cigarettes over month. Doing well with lozenge/gum combo. Encouraged continued cessation.

## 2014-10-27 NOTE — Assessment & Plan Note (Signed)
Controlled, continue lisinopril

## 2014-10-27 NOTE — Assessment & Plan Note (Signed)
LDL 52 today. Continue lipitor 20mg  daily

## 2015-02-23 ENCOUNTER — Other Ambulatory Visit: Payer: Self-pay

## 2015-02-23 ENCOUNTER — Encounter: Payer: Self-pay | Admitting: Family Medicine

## 2015-02-23 DIAGNOSIS — Z1231 Encounter for screening mammogram for malignant neoplasm of breast: Secondary | ICD-10-CM

## 2015-03-02 ENCOUNTER — Ambulatory Visit (INDEPENDENT_AMBULATORY_CARE_PROVIDER_SITE_OTHER): Payer: Medicare PPO | Admitting: Family Medicine

## 2015-03-02 ENCOUNTER — Other Ambulatory Visit: Payer: Self-pay | Admitting: Family Medicine

## 2015-03-02 ENCOUNTER — Encounter: Payer: Self-pay | Admitting: Family Medicine

## 2015-03-02 VITALS — BP 132/82 | HR 71 | Temp 98.2°F | Ht 69.0 in | Wt 192.0 lb

## 2015-03-02 DIAGNOSIS — Z720 Tobacco use: Secondary | ICD-10-CM | POA: Diagnosis not present

## 2015-03-02 DIAGNOSIS — Z1159 Encounter for screening for other viral diseases: Secondary | ICD-10-CM | POA: Diagnosis not present

## 2015-03-02 DIAGNOSIS — E7800 Pure hypercholesterolemia, unspecified: Secondary | ICD-10-CM

## 2015-03-02 DIAGNOSIS — I10 Essential (primary) hypertension: Secondary | ICD-10-CM

## 2015-03-02 DIAGNOSIS — E1149 Type 2 diabetes mellitus with other diabetic neurological complication: Secondary | ICD-10-CM

## 2015-03-02 DIAGNOSIS — Z23 Encounter for immunization: Secondary | ICD-10-CM

## 2015-03-02 DIAGNOSIS — Z20828 Contact with and (suspected) exposure to other viral communicable diseases: Secondary | ICD-10-CM

## 2015-03-02 LAB — POCT URINALYSIS DIPSTICK
Bilirubin, UA: NEGATIVE
Glucose, UA: NEGATIVE
Ketones, UA: NEGATIVE
Leukocytes, UA: NEGATIVE
NITRITE UA: NEGATIVE
PROTEIN UA: NEGATIVE
RBC UA: NEGATIVE
Spec Grav, UA: 1.02
UROBILINOGEN UA: 0.2
pH, UA: 6

## 2015-03-02 LAB — HEMOGLOBIN A1C: Hgb A1c MFr Bld: 6.8 % — ABNORMAL HIGH (ref 4.6–6.5)

## 2015-03-02 MED ORDER — ACCU-CHEK FASTCLIX LANCETS MISC: Status: DC

## 2015-03-02 MED ORDER — GLUCOSE BLOOD VI STRP: ORAL_STRIP | Status: DC

## 2015-03-02 NOTE — Assessment & Plan Note (Signed)
S: controlled. On lipitor 20mg  daily A/P:Continue current meds with LDL at goal <100 at 52

## 2015-03-02 NOTE — Patient Instructions (Addendum)
Labs before you leave- a1c, hep c, urine  No changes to meds  As long as a1c less than 7, see you in 6 months  Samantha Snyder will get your refills in  Got your flu shot

## 2015-03-02 NOTE — Assessment & Plan Note (Addendum)
S:on patches was at 21mg  and had reaction with red itchy rash. Continues to smoke A/P: is going to try lozenges in future again without patches but cannot tolerate patches. Check UA as smoker- negative for hematuria

## 2015-03-02 NOTE — Progress Notes (Signed)
Pre visit review using our clinic review tool, if applicable. No additional management support is needed unless otherwise documented below in the visit note. 

## 2015-03-02 NOTE — Assessment & Plan Note (Signed)
S: controlled. On lisinopril BP Readings from Last 3 Encounters:  03/02/15 132/82  10/27/14 122/64  08/12/14 110/60  A/P:Continue current meds

## 2015-03-02 NOTE — Assessment & Plan Note (Signed)
S: controlled. Compliant with metformin 1g BID. Sugars 112-118 weekly average Lab Results  Component Value Date   HGBA1C 6.8* 03/02/2015  A/P:Continue current meds. Needs to bump exercise back up. Diabetic shoe forms needed next vsiit

## 2015-03-02 NOTE — Progress Notes (Signed)
Garret Reddish, MD  Subjective:  Samantha Snyder is a 69 y.o. year old very pleasant female patient who presents for/with See problem oriented charting ROS- no hypoglycemia or blurry vision. No chest pain or shortness of breath.   Past Medical History-  Patient Active Problem List   Diagnosis Date Noted  . DM (diabetes mellitus) type II controlled, neurological manifestation 02/24/2014    Priority: High  . Tobacco abuse 02/24/2014    Priority: High  . Osteopenia 03/30/2014    Priority: Medium  . Essential hypertension, benign 03/08/2014    Priority: Medium  . High cholesterol 02/24/2014    Priority: Medium  . Diabetic peripheral neuropathy 10/27/2014    Priority: Low  . Seasonal allergies 02/24/2014    Priority: Low    Medications- reviewed and updated Current Outpatient Prescriptions  Medication Sig Dispense Refill  . ACCU-CHEK FASTCLIX LANCETS MISC Check blood sugars daily. Dx:E11.9 100 each 10  . aspirin 81 MG tablet Take 81 mg by mouth daily.    [name] atorvastatin (LIPITOR) 20 MG tablet Take 1 tablet (20 mg total) by mouth daily. 90 tablet 3  . Calcium Carbonate-Vit D-Min (CALCIUM 1200 PO) Take 2 tablets by mouth.    . Cholecalciferol (VITAMIN D3) 2000 UNITS TABS Take 1 tablet by mouth.    . fexofenadine (ALLEGRA) 180 MG tablet Take 180 mg by mouth daily.    [name] glucose blood (ACCU-CHEK SMARTVIEW) test strip Check blood sugars daily 100 each 10  . ibuprofen (ADVIL,MOTRIN) 400 MG tablet Take 400 mg by mouth at bedtime as needed.     [name] lisinopril (PRINIVIL,ZESTRIL) 5 MG tablet Take 1 tablet (5 mg total) by mouth daily. 90 tablet 3  . Magnesium 500 MG CAPS Take 1 capsule by mouth.    . metFORMIN (GLUCOPHAGE) 1000 MG tablet Take 1 tablet (1,000 mg total) by mouth 2 (two) times daily with a meal. 180 tablet 3  . Multiple Vitamins-Minerals (MULTIVITAMIN PO) Take 1 tablet by mouth.    . Omega-3 Fatty Acids (FISH OIL PO) Take 1 tablet by mouth.    . Probiotic Product (PROBIOTIC DAILY PO)  Take 1 tablet by mouth.    . Wheat Dextrin (BENEFIBER PO) Take 1 tablet by mouth.    [name] glucose blood (ACCU-CHEK SMARTVIEW) test strip Use to test blood sugars daily. E11.49 100 each 12   No current facility-administered medications for this visit.    Objective: BP 132/82 mmHg  Pulse 71  Temp(Src) 98.2 F (36.8 C) (Oral)  Ht 5\' 9"  (1.753 m)  Wt 192 lb (87.091 kg)  BMI 28.34 kg/m2 Gen: NAD, resting comfortably CV: RRR no murmurs rubs or gallops Lungs: CTAB no crackles, wheeze, rhonchi Abdomen: soft/nontender/nondistended/normal bowel sounds. No rebound or guarding.  Ext: trace edema Skin: warm, dry Neuro: grossly normal, moves all extremities  Assessment/Plan:  DM (diabetes mellitus) type II controlled, neurological manifestation S: controlled. Compliant with metformin 1g BID. Sugars 112-118 weekly average Lab Results  Component Value Date   HGBA1C 6.8* 03/02/2015  A/P:Continue current meds. Needs to bump exercise back up. Diabetic shoe forms needed next vsiit   Essential hypertension, benign S: controlled. On lisinopril BP Readings from Last 3 Encounters:  03/02/15 132/82  10/27/14 122/64  08/12/14 110/60  A/P:Continue current meds   High cholesterol S: controlled. On lipitor 20mg  daily A/P:Continue current meds with LDL at goal <100 at 52   Tobacco abuse S:on patches was at 21mg  and had reaction with red itchy rash. Continues to smoke A/P: is  going to try lozenges in future again without patches but cannot tolerate patches. Check UA as smoker- negative for hematuria    6 month f/u Plans for eye visit=Waiting on calendar for October Requests hep c screen Received flu shot  Orders Placed This Encounter  Procedures  . Flu Vaccine QUAD 36+ mos IM  . Hemoglobin A1c    [place]  . Hepatitis C antibody, reflex    [facility]  . POCT urinalysis dipstick   Meds ordered this encounter  Medications  . ACCU-CHEK FASTCLIX LANCETS MISC    Sig: Check blood sugars  daily. Dx:E11.9    Dispense:  100 each    Refill:  10  . glucose blood (ACCU-CHEK SMARTVIEW) test strip    Sig: Use to test blood sugars daily. E11.49    Dispense:  100 each    Refill:  12

## 2015-03-03 LAB — HEPATITIS C ANTIBODY: HCV AB: NEGATIVE

## 2015-04-05 ENCOUNTER — Ambulatory Visit
Admission: RE | Admit: 2015-04-05 | Discharge: 2015-04-05 | Disposition: A | Discharge status: Home / Self Care | Payer: Medicare PPO | Source: Ambulatory Visit

## 2015-04-05 DIAGNOSIS — Z1231 Encounter for screening mammogram for malignant neoplasm of breast: Secondary | ICD-10-CM

## 2015-04-11 LAB — HM DIABETES EYE EXAM

## 2015-04-18 ENCOUNTER — Encounter: Payer: Self-pay | Admitting: Family Medicine

## 2015-04-27 ENCOUNTER — Encounter: Payer: Self-pay | Admitting: Family Medicine

## 2015-07-10 ENCOUNTER — Telehealth: Payer: Self-pay | Admitting: Family Medicine

## 2015-07-10 NOTE — Telephone Encounter (Signed)
Im fine with it being switched. Is patient sure they cover CPE or was she referencing AWV? Regardless- either can be done (change 15 minute visit to full 30 minute visit for AWV or CPE) but just make sure scheduled for right one and then write ok per MD

## 2015-07-10 NOTE — Telephone Encounter (Signed)
Patient has new insurance and states that wellness visit are covered. Patient wants to know if she can get a physical instead of 6 month follow that is schedule 09/04/2015 if that is ok with the MD.

## 2015-07-10 NOTE — Telephone Encounter (Signed)
Samantha Snyder see below.

## 2015-07-10 NOTE — Telephone Encounter (Signed)
See below

## 2015-07-11 NOTE — Telephone Encounter (Signed)
Patient called back and stated that insurance pays for both. Patient decided to go with the CPE. I schedule patient for the CPE and as well as fasting lab before her visit. Patient also stated that she needs MD to fill out paperwork for her diabetic shoes patient states that she will bring paperwork during visit in March.

## 2015-08-09 ENCOUNTER — Other Ambulatory Visit: Payer: Self-pay | Admitting: Family Medicine

## 2015-08-09 MED ORDER — ACCU-CHEK FASTCLIX LANCETS MISC: Status: DC

## 2015-08-09 MED ORDER — GLUCOSE BLOOD VI STRP: ORAL_STRIP | Status: DC

## 2015-08-28 ENCOUNTER — Other Ambulatory Visit (INDEPENDENT_AMBULATORY_CARE_PROVIDER_SITE_OTHER): Payer: Medicare Other

## 2015-08-28 DIAGNOSIS — Z0000 Encounter for general adult medical examination without abnormal findings: Secondary | ICD-10-CM

## 2015-08-28 LAB — POC URINALSYSI DIPSTICK (AUTOMATED)
Bilirubin, UA: NEGATIVE
Glucose, UA: NEGATIVE
KETONES UA: NEGATIVE
NITRITE UA: POSITIVE
PH UA: 5.5
Protein, UA: NEGATIVE
RBC UA: NEGATIVE
Spec Grav, UA: 1.02
Urobilinogen, UA: 0.2

## 2015-08-28 LAB — CBC WITH DIFFERENTIAL/PLATELET
BASOS PCT: 0.5 % (ref 0.0–3.0)
Basophils Absolute: 0 10*3/uL (ref 0.0–0.1)
EOS PCT: 2.2 % (ref 0.0–5.0)
Eosinophils Absolute: 0.2 10*3/uL (ref 0.0–0.7)
HCT: 44 % (ref 36.0–46.0)
Hemoglobin: 14.9 g/dL (ref 12.0–15.0)
Lymphocytes Relative: 26.4 % (ref 12.0–46.0)
Lymphs Abs: 2.4 10*3/uL (ref 0.7–4.0)
MCHC: 33.9 g/dL (ref 30.0–36.0)
MCV: 89.1 fl (ref 78.0–100.0)
MONO ABS: 0.5 10*3/uL (ref 0.1–1.0)
MONOS PCT: 5 % (ref 3.0–12.0)
NEUTROS PCT: 65.9 % (ref 43.0–77.0)
Neutro Abs: 6 10*3/uL (ref 1.4–7.7)
Platelets: 376 10*3/uL (ref 150.0–400.0)
RBC: 4.93 Mil/uL (ref 3.87–5.11)
RDW: 14.4 % (ref 11.5–15.5)
WBC: 9.2 10*3/uL (ref 4.0–10.5)

## 2015-08-28 LAB — LIPID PANEL
Cholesterol: 128 mg/dL (ref 0–200)
HDL: 40.7 mg/dL (ref >39.00)
LDL Cholesterol: 47 mg/dL (ref 0–99)
NONHDL: 86.84
Total CHOL/HDL Ratio: 3
Triglycerides: 199 mg/dL — ABNORMAL HIGH (ref 0.0–149.0)
VLDL: 39.8 mg/dL (ref 0.0–40.0)

## 2015-08-28 LAB — BASIC METABOLIC PANEL
BUN: 22 mg/dL (ref 6–23)
CHLORIDE: 107 meq/L (ref 96–112)
CO2: 23 meq/L (ref 19–32)
Calcium: 9.4 mg/dL (ref 8.4–10.5)
Creatinine, Ser: 0.58 mg/dL (ref 0.40–1.20)
GFR: 109.86 mL/min (ref >60.00)
Glucose, Bld: 155 mg/dL — ABNORMAL HIGH (ref 70–99)
Potassium: 4 mEq/L (ref 3.5–5.1)
Sodium: 139 mEq/L (ref 135–145)

## 2015-08-28 LAB — MICROALBUMIN / CREATININE URINE RATIO
Creatinine,U: 65 mg/dL
Microalb Creat Ratio: 1.1 mg/g (ref 0.0–30.0)

## 2015-08-28 LAB — HEPATIC FUNCTION PANEL
ALT: 20 U/L (ref 0–35)
AST: 16 U/L (ref 0–37)
Albumin: 4.1 g/dL (ref 3.5–5.2)
Alkaline Phosphatase: 96 U/L (ref 39–117)
BILIRUBIN TOTAL: 0.4 mg/dL (ref 0.2–1.2)
Bilirubin, Direct: 0.1 mg/dL (ref 0.0–0.3)
Total Protein: 6.4 g/dL (ref 6.0–8.3)

## 2015-08-28 LAB — TSH: TSH: 1.34 u[IU]/mL (ref 0.35–4.50)

## 2015-08-28 LAB — HEMOGLOBIN A1C: Hgb A1c MFr Bld: 6.9 % — ABNORMAL HIGH (ref 4.6–6.5)

## 2015-08-29 ENCOUNTER — Encounter: Payer: Self-pay | Admitting: Family Medicine

## 2015-09-04 ENCOUNTER — Ambulatory Visit (INDEPENDENT_AMBULATORY_CARE_PROVIDER_SITE_OTHER): Payer: Medicare Other | Admitting: Family Medicine

## 2015-09-04 ENCOUNTER — Encounter: Payer: Self-pay | Admitting: Family Medicine

## 2015-09-04 ENCOUNTER — Other Ambulatory Visit: Payer: Self-pay | Admitting: Acute Care

## 2015-09-04 ENCOUNTER — Other Ambulatory Visit: Payer: Self-pay | Admitting: Family Medicine

## 2015-09-04 VITALS — BP 130/70 | HR 84 | Temp 98.4°F | Ht 69.0 in | Wt 195.0 lb

## 2015-09-04 DIAGNOSIS — M8580 Other specified disorders of bone density and structure, unspecified site: Secondary | ICD-10-CM

## 2015-09-04 DIAGNOSIS — F17210 Nicotine dependence, cigarettes, uncomplicated: Principal | ICD-10-CM

## 2015-09-04 DIAGNOSIS — Z720 Tobacco use: Secondary | ICD-10-CM

## 2015-09-04 DIAGNOSIS — Z1211 Encounter for screening for malignant neoplasm of colon: Secondary | ICD-10-CM

## 2015-09-04 DIAGNOSIS — R6889 Other general symptoms and signs: Secondary | ICD-10-CM

## 2015-09-04 DIAGNOSIS — F17200 Nicotine dependence, unspecified, uncomplicated: Secondary | ICD-10-CM

## 2015-09-04 DIAGNOSIS — E1142 Type 2 diabetes mellitus with diabetic polyneuropathy: Secondary | ICD-10-CM

## 2015-09-04 DIAGNOSIS — Z0001 Encounter for general adult medical examination with abnormal findings: Secondary | ICD-10-CM | POA: Diagnosis not present

## 2015-09-04 MED ORDER — GABAPENTIN 100 MG PO CAPS: 100.0000 mg | ORAL_CAPSULE | Freq: Every day | ORAL | Status: DC

## 2015-09-04 MED ORDER — ATORVASTATIN CALCIUM 20 MG PO TABS: 20.0000 mg | ORAL_TABLET | Freq: Every day | ORAL | Status: DC

## 2015-09-04 MED ORDER — ACCU-CHEK FASTCLIX LANCETS MISC: Status: DC

## 2015-09-04 MED ORDER — LISINOPRIL 5 MG PO TABS: 5.0000 mg | ORAL_TABLET | Freq: Every day | ORAL | Status: DC

## 2015-09-04 MED ORDER — METFORMIN HCL 1000 MG PO TABS: 1000.0000 mg | ORAL_TABLET | Freq: Two times a day (BID) | ORAL | Status: DC

## 2015-09-04 MED ORDER — GLUCOSE BLOOD VI STRP: ORAL_STRIP | Status: DC

## 2015-09-04 NOTE — Patient Instructions (Addendum)
No changes today  They will call you about colonoscopy  dont leave without your forms back  Trial gabapentin 100mg  in evening for nerve pain

## 2015-09-04 NOTE — Progress Notes (Signed)
Garret Reddish, MD Phone: [phone]  Subjective:  Patient presents today for their annual physical. Chief complaint-noted.   See problem oriented charting- ROS- full  review of systems was completed and negative including No chest pain or shortness of breath. No headache or blurry vision.   The following were reviewed and entered/updated in epic: Past Medical History  Diagnosis Date  . Allergy   . Diabetes mellitus without complication ([place])   . Hyperlipidemia   . Chicken pox 02/24/2014    Has had shingles vaccine  . Diverticulosis 02/24/2014  . Vaginal prolapse     Status post surgery. Improved urinary incontinence  . Chronic diarrhea     For one year-needed prolonged course of antibiotics  . 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 History of UTI     05/2013  . History of UTI 2014    per pt noted after taking Bactrim due to tooth infection   Patient Active Problem List   Diagnosis Date Noted  . DM (diabetes mellitus) type II controlled, neurological manifestation ([place]) 02/24/2014    Priority: High  . Tobacco abuse 02/24/2014    Priority: High  . Osteopenia 03/30/2014    Priority: Medium  . Essential hypertension, benign 03/08/2014    Priority: Medium  . High cholesterol 02/24/2014    Priority: Medium  . Diabetic peripheral neuropathy ([place]) 10/27/2014    Priority: Low  . Seasonal allergies 02/24/2014    Priority: Low   Past Surgical History  Procedure Laterality Date  . Tubal ligation    . Induced abortion  1985    Therapeutic  . Tonsillectomy Bilateral 1955  . Abdominal hysterectomy    . Bladder surgery  09/07/2013    vaginal prolapse    Family History  Problem Relation Age of Onset  . Cancer Father     Lung but was a smoker  . Stroke Father     67 massive lead to death  . Diabetes Father   . Diabetes Maternal Grandmother   . Osteoporosis Mother   . Bladder Cancer Father     Medications- reviewed and updated Current  Outpatient Prescriptions  Medication Sig Dispense Refill  . ACCU-CHEK FASTCLIX LANCETS MISC Use to check blood sugars 3-4 times daily. Dx:E11.9 400 each 10  . aspirin 81 MG tablet Take 81 mg by mouth daily.    [name] atorvastatin (LIPITOR) 20 MG tablet Take 1 tablet (20 mg total) by mouth daily. 90 tablet 3  . Calcium Carbonate-Vit D-Min (CALCIUM 1200 PO) Take 2 tablets by mouth.    . Cholecalciferol (VITAMIN D3) 2000 UNITS TABS Take 1 tablet by mouth.    . fexofenadine (ALLEGRA) 180 MG tablet Take 180 mg by mouth daily.    [name] glucose blood (ACCU-CHEK SMARTVIEW) test strip Use to test blood sugars 3-4 times daily. E11.9 400 each 10  . lisinopril (PRINIVIL,ZESTRIL) 5 MG tablet Take 1 tablet (5 mg total) by mouth daily. 90 tablet 3  . Magnesium 500 MG CAPS Take 1 capsule by mouth.    . metFORMIN (GLUCOPHAGE) 1000 MG tablet Take 1 tablet (1,000 mg total) by mouth 2 (two) times daily with a meal. 180 tablet 3  . Multiple Vitamins-Minerals (MULTIVITAMIN PO) Take 1 tablet by mouth.    . Omega-3 Fatty Acids (FISH OIL PO) Take 1 tablet by mouth.    . Probiotic Product (PROBIOTIC DAILY PO) Take 1 tablet by mouth.    . Wheat Dextrin (BENEFIBER PO)  Take 1 tablet by mouth.    [name] ibuprofen (ADVIL,MOTRIN) 400 MG tablet Take 400 mg by mouth at bedtime as needed. Reported on 09/04/2015     No current facility-administered medications for this visit.    Allergies-reviewed and updated No Known Allergies  Social History   Social History  . Marital Status: Married    Spouse Name: N/A  . Number of Children: N/A  . Years of Education: N/A   Social History Main Topics  . Smoking status: Current Every Day Smoker -- 1.00 packs/day    Types: Cigarettes  . Smokeless tobacco: Never Used  . Alcohol Use: No  . Drug Use: No  . Sexual Activity: Not Asked   Other Topics Concern  . None   Social History Narrative   Married 45 years in 2015.  Moved to [place] to be near daughter who lives here. Has a son as well and 75  year old grandson. 3 granddogs. One dog at home       Retired former  Academic librarian. Has a BS in biology from [employer]. Used to be an algologist-studied algae under microscope      Hobbies-exercising with her husband          ROS--See HPI   Objective: BP 130/70 mmHg  Pulse 84  Temp(Src) 98.4 F (36.9 C)  Ht 5' 9"  (1.753 m)  Wt 195 lb (88.451 kg)  BMI 28.78 kg/m2 Gen: NAD, resting comfortably HEENT: Mucous membranes are moist. Oropharynx normal Neck: no thyromegaly CV: RRR no murmurs rubs or gallops Lungs: CTAB no crackles, wheeze, rhonchi Abdomen: soft/nontender/nondistended/normal bowel sounds. No rebound or guarding.  Ext: no edema Skin: warm, dry Neuro: grossly normal, moves all extremities, PERRLA  Diabetic Foot Exam - Simple   Simple Foot Form  Diabetic Foot exam was performed with the following findings:  Yes 09/04/2015 10:10 AM  Visual Inspection  No deformities, no ulcerations, no other skin breakdown bilaterally:  Yes  Sensation Testing  Intact to touch and monofilament testing bilaterally:  Yes  Pulse Check  Posterior Tibialis and Dorsalis pulse intact bilaterally:  Yes  Comments  Does have large callus great toe bilaterally. Reduced sensation distally but still feels     Assessment/Plan:  70 y.o. female presenting for annual physical.  Health Maintenance counseling: 1. Anticipatory guidance: Patient counseled regarding regular dental exams, eye exams, wearing seatbelts.  2. Risk factor reduction:  Advised patient of need for regular exercise and diet rich and fruits and vegetables to reduce risk of heart attack and stroke. Watch weight creep- need to reverse Wt Readings from Last 3 Encounters:  09/04/15 195 lb (88.451 kg)  03/02/15 192 lb (87.091 kg)  10/27/14 186 lb (84.369 kg)  3. Immunizations/screenings/ancillary studies- up to date Immunization History  Administered Date(s) Administered  . Hepatitis A 02/28/1948  . Hepatitis B  03[birth year]28[birth year]1949  . Influenza, High Dose Seasonal PF 03/17/2014  . Influenza,inj,Quad PF,36+ Mos 03/02/2015  . Influenza-Unspecified 03/10/2010, 02/25/2012, 03/17/2013  . MMR 05/14/1990, 05/14/1992  . Pneumococcal Conjugate-13 03/17/2013, 06/07/2013  . Pneumococcal Polysaccharide-23 09/19/2005, 02/24/2014  . Pneumococcal-Unspecified 09/19/2005  . Td 02/28/1950, 02/29/1956, 09/19/2005  . Tdap 02/29/1960  . Varicella 10/19/1991  . Zoster 02/28/2009   4. Cervical cancer screening- no history abnormal pap smear- stopped age 29 5. Breast cancer screening-  breast exam declines- does self exams and prefers no MD exam- and mammogram 04/05/15 6. Colon cancer screening - 11/07/15 needed- go ahead and refer today  Tobacco abuse- tried quitting with patch  but had rash. 1 PPD. Has lozenges . Encouraged support group DM With neuropathy- filled out diabetic shoe form Lab Results  Component Value Date   HGBA1C 6.9* 08/28/2015  Osteopenia- 03/30/14 last bone density. Taking vitamin D and calcium.  Hyperlipidemia- well controlled on lipitor Hypertension- controlled on lisinopril  DM (diabetes mellitus) type II controlled, neurological manifestation S: well controlled on metformin 1g BID A/P: continue current medicine. Trial gabapentin 126m nightly for neuropathy    Return in about 6 months (around 03/06/2016) for follow up- or sooner if needed. a1c fingerprick at that time.. Return precautions advised.   Meds ordered this encounter  Medications  . glucose blood (ACCU-CHEK SMARTVIEW) test strip    Sig: Use to test blood sugars 3-4 times daily. E11.9    Dispense:  400 each    Refill:  10    Dx: E11.9  . ACCU-CHEK FASTCLIX LANCETS MISC    Sig: Use to check blood sugars 3-4 times daily. Dx:E11.9    Dispense:  400 each    Refill:  10  . metFORMIN (GLUCOPHAGE) 1000 MG tablet    Sig: Take 1 tablet (1,000 mg total) by mouth 2 (two) times daily with a meal.    Dispense:  180 tablet    Refill:  3  .  atorvastatin (LIPITOR) 20 MG tablet    Sig: Take 1 tablet (20 mg total) by mouth daily.    Dispense:  90 tablet    Refill:  3  . lisinopril (PRINIVIL,ZESTRIL) 5 MG tablet    Sig: Take 1 tablet (5 mg total) by mouth daily.    Dispense:  90 tablet    Refill:  3  . gabapentin (NEURONTIN) 100 MG capsule    Sig: Take 1 capsule (100 mg total) by mouth at bedtime.    Dispense:  90 capsule    Refill:  3

## 2015-09-04 NOTE — Assessment & Plan Note (Signed)
S: well controlled on metformin 1g BID A/P: continue current medicine. Trial gabapentin 100mg  nightly for neuropathy

## 2015-09-05 ENCOUNTER — Other Ambulatory Visit: Payer: Self-pay

## 2015-09-05 MED ORDER — ATORVASTATIN CALCIUM 20 MG PO TABS: 20.0000 mg | ORAL_TABLET | Freq: Every day | ORAL | Status: DC

## 2015-09-05 MED ORDER — METFORMIN HCL 1000 MG PO TABS: 1000.0000 mg | ORAL_TABLET | Freq: Two times a day (BID) | ORAL | Status: DC

## 2015-09-05 MED ORDER — LISINOPRIL 5 MG PO TABS: 5.0000 mg | ORAL_TABLET | Freq: Every day | ORAL | Status: DC

## 2015-09-05 NOTE — Telephone Encounter (Signed)
Pt called to confirm prescriptions have been sent to the correct pharmacy.  Advised pt this has been done by [facility].

## 2015-09-11 ENCOUNTER — Encounter: Payer: Self-pay | Admitting: Acute Care

## 2015-09-11 ENCOUNTER — Ambulatory Visit (INDEPENDENT_AMBULATORY_CARE_PROVIDER_SITE_OTHER): Payer: Medicare Other | Admitting: Acute Care

## 2015-09-11 ENCOUNTER — Ambulatory Visit (INDEPENDENT_AMBULATORY_CARE_PROVIDER_SITE_OTHER)
Admission: RE | Admit: 2015-09-11 | Discharge: 2015-09-11 | Disposition: A | Discharge status: Home / Self Care | Payer: Medicare Other | Source: Ambulatory Visit | Attending: Acute Care | Admitting: Acute Care

## 2015-09-11 DIAGNOSIS — F17210 Nicotine dependence, cigarettes, uncomplicated: Secondary | ICD-10-CM | POA: Diagnosis not present

## 2015-09-11 NOTE — Progress Notes (Signed)
Shared Decision Making Visit Lung Cancer Screening Program [phone])   Eligibility:  Age 70 y.o.  Pack Years Smoking History Calculation : 53-pack-year smoker (# packs/per year x # years smoked)  Recent History of coughing up blood  no  Unexplained weight loss? no ( >Than 15 pounds within the last 6 months )  Prior History Lung / other cancer no (Diagnosis within the last 5 years already requiring surveillance chest CT Scans).  Smoking Status Current Smoker  Former Smokers: Years since quit: NA  Quit Date: NA  Visit Components:  Discussion included one or more decision making aids. yes  Discussion included risk/benefits of screening. yes  Discussion included potential follow up diagnostic testing for abnormal scans. yes  Discussion included meaning and risk of over diagnosis. yes  Discussion included meaning and risk of False Positives. yes  Discussion included meaning of total radiation exposure. yes  Counseling Included:  Importance of adherence to annual lung cancer LDCT screening. yes  Impact of comorbidities on ability to participate in the program. yes  Ability and willingness to under diagnostic treatment. yes  Smoking Cessation Counseling:  Current Smokers:   Discussed importance of smoking cessation. yes  Information about tobacco cessation classes and interventions provided to patient. yes  Patient provided with "ticket" for LDCT Scan. yes  Symptomatic Patient. no  CounselingNA  Diagnosis Code: Tobacco Use Z72.0  Asymptomatic Patient yes  Counseling (Intermediate counseling: > three minutes counseling) [id number]  Former Smokers:   Discussed the importance of maintaining cigarette abstinence. NA: current smoker  Diagnosis Code: Personal History of Nicotine Dependence. [id number]  Information about tobacco cessation classes and interventions provided to patient. Yes  Patient provided with "ticket" for LDCT Scan. yes  Written Order for Lung  Cancer Screening with LDCT placed in Epic. Yes (CT Chest Lung Cancer Screening Low Dose W/O CM) [id number] Z12.2-Screening of respiratory organs Z87.891-Personal history of nicotine dependence  I have spent 15 minutes of face to face time with Mrs. Cassler discussing the risks and benefits of lung cancer screening. We viewed a power point together that explained in detail the above noted topics. We paused at intervals to allow for questions to be asked and answered to ensure understanding.We discussed that the single most powerful action that she can take to decrease her risk of developing lung cancer is to quit smoking. We discussed whether or not she is ready to commit to setting a quit date. She is currently not ready to commit to a quit date. We discussed options for tools to aid in quitting smoking including nicotine replacement therapy, non-nicotine medications, support groups, Quit Smart classes, and behavior modification. We discussed that often times setting smaller, more achievable goals, such as eliminating 1 cigarette a day for a week and then 2 cigarettes a day for a week can be helpful in slowly decreasing the number of cigarettes smoked. This allows for a sense of accomplishment as well as providing a clinical benefit. I gave Mrs. Champagne the " Be Stronger Than Your Excuses" card with contact information for community resources, classes, free nicotine replacement therapy, and access to mobile apps, text messaging, and on-line smoking cessation help. I have also given her my card and contact information in the event she needs to contact me. We discussed the time and location of the scan, and that either June Leap, CMA, or I will call with the results within 24-48 hours of receiving them. I have provided her with a copy of the power point we  viewed  as a resource in the event they need reinforcement of the concepts we discussed today in the office. The patient verbalized understanding of all of   the above and had no further questions upon leaving the office. They have my contact information in the event they have any further questions.   Magdalen Spatz, NP  09/11/2015

## 2015-09-12 ENCOUNTER — Encounter: Payer: Self-pay | Admitting: Internal Medicine

## 2015-10-10 ENCOUNTER — Encounter: Payer: Self-pay | Admitting: Family Medicine

## 2015-10-10 ENCOUNTER — Ambulatory Visit (INDEPENDENT_AMBULATORY_CARE_PROVIDER_SITE_OTHER): Payer: Medicare Other | Admitting: Family Medicine

## 2015-10-10 VITALS — BP 120/70 | HR 65 | Temp 98.9°F | Wt 196.0 lb

## 2015-10-10 DIAGNOSIS — R0781 Pleurodynia: Secondary | ICD-10-CM

## 2015-10-10 DIAGNOSIS — T148XXA Other injury of unspecified body region, initial encounter: Secondary | ICD-10-CM

## 2015-10-10 DIAGNOSIS — T148 Other injury of unspecified body region: Secondary | ICD-10-CM

## 2015-10-10 NOTE — Progress Notes (Signed)
Garret Reddish, MD  Subjective:  Samantha Snyder is a 70 y.o. year old very pleasant female patient who presents for/with See problem oriented charting ROS- no chest pain or shortness of breath, no fever. Does have mild bruising.   Past Medical History-  Patient Active Problem List   Diagnosis Date Noted  . DM (diabetes mellitus) type II controlled, neurological manifestation ([place]) 02/24/2014    Priority: High  . Tobacco abuse 02/24/2014    Priority: High  . Osteopenia 03/30/2014    Priority: Medium  . Essential hypertension, benign 03/08/2014    Priority: Medium  . High cholesterol 02/24/2014    Priority: Medium  . Diabetic peripheral neuropathy ([place]) 10/27/2014    Priority: Low  . Seasonal allergies 02/24/2014    Priority: Low    Medications- reviewed and updated Current Outpatient Prescriptions  Medication Sig Dispense Refill  . ACCU-CHEK FASTCLIX LANCETS MISC Use to check blood sugars 3-4 times daily. Dx:E11.9 400 each 10  . aspirin 81 MG tablet Take 81 mg by mouth daily.    [name] atorvastatin (LIPITOR) 20 MG tablet Take 1 tablet (20 mg total) by mouth daily. 90 tablet 3  . Calcium Carbonate-Vit D-Min (CALCIUM 1200 PO) Take 2 tablets by mouth.    . Cholecalciferol (VITAMIN D3) 2000 UNITS TABS Take 1 tablet by mouth.    . fexofenadine (ALLEGRA) 180 MG tablet Take 180 mg by mouth daily.    [name] gabapentin (NEURONTIN) 100 MG capsule Take 1 capsule (100 mg total) by mouth at bedtime. 90 capsule 3  . glucose blood (ACCU-CHEK SMARTVIEW) test strip Use to test blood sugars 3-4 times daily. E11.9 400 each 10  . ibuprofen (ADVIL,MOTRIN) 400 MG tablet Take 400 mg by mouth at bedtime as needed. Reported on 09/04/2015    . lisinopril (PRINIVIL,ZESTRIL) 5 MG tablet Take 1 tablet (5 mg total) by mouth daily. 90 tablet 3  . Magnesium 500 MG CAPS Take 1 capsule by mouth.    . metFORMIN (GLUCOPHAGE) 1000 MG tablet Take 1 tablet (1,000 mg total) by mouth 2 (two) times daily with a meal. 180 tablet 3  .  Multiple Vitamins-Minerals (MULTIVITAMIN PO) Take 1 tablet by mouth.    . Omega-3 Fatty Acids (FISH OIL PO) Take 1 tablet by mouth.    . Probiotic Product (PROBIOTIC DAILY PO) Take 1 tablet by mouth.    . Wheat Dextrin (BENEFIBER PO) Take 1 tablet by mouth.     No current facility-administered medications for this visit.    Objective: BP 120/70 mmHg  Pulse 65  Temp(Src) 98.9 F (37.2 C)  Wt 196 lb (88.905 kg) Gen: NAD, resting comfortably CV: RRR no murmurs rubs or gallops Lungs: CTAB no crackles, wheeze, rhonchi Abdomen: soft/nontender/nondistended/normal bowel sounds. No rebound or guarding.  Right side of ribs with tenderness in several areas, mild bruising, pain with deep palpation.  Ext: no edema Skin: warm, dry, no rash Neuro: grossly normal, moves all extremities  Assessment/Plan:  Right side pain after fall from seated S: Was fishing. Was pulling up anchor and fell out of stand up seat right onto rod holder onto right side. Iced it and put ibuprofen on it. Happened 2 pm yesterday A/P: Wanted to know if this could be a fracture or something more serious but suspected just a contusion. We had a discussion about things to look out for. We considered rib films but opted out as unlikely to change management even if there is a fracture.  Also see AVS  The  duration of face-to-face time during this visit was 15 minutes. Greater than 50% of this time was spent in counseling, explanation of diagnosis, planning of further management, and/or coordination of care.

## 2015-10-10 NOTE — Patient Instructions (Signed)
Possible rib fracture but do not think so. Even if we x-rayed it and saw that likely would not change care except maybe less ibuprofen. You can use ibuprofen 400mg  up to every 6 hours as needed for pain. Ice area heavily for 3 days, then can change to heat.   Let me know immediately if you get shortness of breath or fever  Contusion A contusion is a deep bruise. Contusions are the result of a blunt injury to tissues and muscle fibers under the skin. The injury causes bleeding under the skin. The skin overlying the contusion may turn blue, purple, or yellow. Minor injuries will give you a painless contusion, but more severe contusions may stay painful and swollen for a few weeks.  CAUSES  This condition is usually caused by a blow, trauma, or direct force to an area of the body. SYMPTOMS  Symptoms of this condition include:  Swelling of the injured area.  Pain and tenderness in the injured area.  Discoloration. The area may have redness and then turn blue, purple, or yellow. DIAGNOSIS  This condition is diagnosed based on a physical exam and medical history. An X-ray, CT scan, or MRI may be needed to determine if there are any associated injuries, such as broken bones (fractures). TREATMENT  Specific treatment for this condition depends on what area of the body was injured. In general, the best treatment for a contusion is resting, icing, applying pressure to (compression), and elevating the injured area. This is often called the RICE strategy. Over-the-counter anti-inflammatory medicines may also be recommended for pain control.  HOME CARE INSTRUCTIONS   Rest the injured area.  If directed, apply ice to the injured area:  Put ice in a plastic bag.  Place a towel between your skin and the bag.  Leave the ice on for 20 minutes, 2-3 times per day.  If directed, apply light compression to the injured area using an elastic bandage. Make sure the bandage is not wrapped too tightly. Remove  and reapply the bandage as directed by your health care provider.  If possible, raise (elevate) the injured area above the level of your heart while you are sitting or lying down.  Take over-the-counter and prescription medicines only as told by your health care provider. SEEK MEDICAL CARE IF:  Your symptoms do not improve after several days of treatment.  Your symptoms get worse.  You have difficulty moving the injured area. SEEK IMMEDIATE MEDICAL CARE IF:   You have severe pain.  You have numbness in a hand or foot.  Your hand or foot turns pale or cold.   This information is not intended to replace advice given to you by your health care provider. Make sure you discuss any questions you have with your health care provider.   Document Released: 03/20/2005 Document Revised: 03/01/2015 Document Reviewed: 10/26/2014 Elsevier Interactive Patient Education [employer].

## 2015-10-12 ENCOUNTER — Encounter: Payer: Self-pay | Admitting: Family Medicine

## 2015-11-01 ENCOUNTER — Ambulatory Visit (AMBULATORY_SURGERY_CENTER): Payer: Self-pay | Admitting: *Deleted

## 2015-11-01 ENCOUNTER — Encounter: Payer: Self-pay | Admitting: Internal Medicine

## 2015-11-01 VITALS — Ht 69.0 in | Wt 195.0 lb

## 2015-11-01 DIAGNOSIS — Z1211 Encounter for screening for malignant neoplasm of colon: Secondary | ICD-10-CM

## 2015-11-01 MED ORDER — NA SULFATE-K SULFATE-MG SULF 17.5-3.13-1.6 GM/177ML PO SOLN: 1.0000 | Freq: Once | ORAL | Status: DC

## 2015-11-01 NOTE — Progress Notes (Signed)
No egg or soy allergy known to patient  No issues with past sedation with any surgeries  or procedures, no intubation problems  No diet pills per patient No home 02 use per patient  No blood thinners per patient  Pt denies chronic issues with constipation   emmi declined

## 2015-11-13 ENCOUNTER — Telehealth: Payer: Self-pay | Admitting: Internal Medicine

## 2015-11-13 NOTE — Telephone Encounter (Signed)
Pt questioned can she skip the fish oil and calcium the morning of her procedure. Instructed yes, she can skip any supplements she chooses [facility]

## 2015-11-15 ENCOUNTER — Encounter: Payer: Self-pay | Admitting: Internal Medicine

## 2015-11-15 ENCOUNTER — Ambulatory Visit (AMBULATORY_SURGERY_CENTER): Payer: Medicare Other | Admitting: Internal Medicine

## 2015-11-15 VITALS — BP 126/63 | HR 77 | Temp 99.1°F | Resp 28 | Ht 69.0 in | Wt 195.0 lb

## 2015-11-15 DIAGNOSIS — Z1211 Encounter for screening for malignant neoplasm of colon: Secondary | ICD-10-CM | POA: Diagnosis present

## 2015-11-15 MED ORDER — SODIUM CHLORIDE 0.9 % IV SOLN: 500.0000 mL | INTRAVENOUS | Status: DC

## 2015-11-15 NOTE — Op Note (Signed)
[place] Patient Name: Samantha Snyder Procedure Date: 11/15/2015 9:37 AM MRN: [id number] Endoscopist: Jerene Bears , MD Age: 69 Referring MD:  Date of Birth: 1946/04/13 Gender: Female Procedure:                Colonoscopy Indications:              Screening for colorectal malignant neoplasm, Last                            colonoscopy 10 years ago Medicines:                Monitored Anesthesia Care Procedure:                Pre-Anesthesia Assessment:                           - Prior to the procedure, a History and Physical                            was performed, and patient medications and                            allergies were reviewed. The patient's tolerance of                            previous anesthesia was also reviewed. The risks                            and benefits of the procedure and the sedation                            options and risks were discussed with the patient.                            All questions were answered, and informed consent                            was obtained. Prior Anticoagulants: The patient has                            taken no previous anticoagulant or antiplatelet                            agents. ASA Grade Assessment: II - A patient with                            mild systemic disease. After reviewing the risks                            and benefits, the patient was deemed in                            satisfactory condition to undergo the procedure.  After obtaining informed consent, the colonoscope                            was passed under direct vision. Throughout the                            procedure, the patient's blood pressure, pulse, and                            oxygen saturations were monitored continuously. The                            Model PCF-H190L [id number]) scope was introduced                            through the anus with the intention of advancing to                     the cecum. The scope was advanced to the sigmoid                            colon before the procedure was aborted. Medications                            were given. The colonoscopy was performed with                            difficulty due to poor bowel prep with stool                            present. The patient tolerated the procedure well.                            The quality of the bowel preparation was poor. The                            rectum was photographed. Scope In: 9:45:10 AM Scope Out: [id number] AM Total Procedure Duration: 0 hours 1 minute 4 seconds  Findings:                 A large amount of stool was found in the rectum and                            in the distal sigmoid colon, precluding                            visualization.                           Multiple diverticula were found in the sigmoid                            colon.  Retroflexion in the rectum was not performed due to                            aborting the procedure before completion. Complications:            No immediate complications. Estimated Blood Loss:     Estimated blood loss: none. Impression:               - Preparation of the colon was poor.                           - Stool in the rectum and in the distal sigmoid                            colon.                           - Diverticulosis in the sigmoid colon.                           - No specimens collected. Recommendation:           - Patient has a contact number available for                            emergencies. The signs and symptoms of potential                            delayed complications were discussed with the                            patient. Return to normal activities tomorrow.                            Written discharge instructions were provided to the                            patient.                           - Resume previous diet.                            - Continue present medications.                           - Repeat colonoscopy at the next available                            appointment because the bowel preparation was poor.                            2 DAY PREP. Jerene Bears, MD 11/15/2015 9:53:07 AM This report has been signed electronically.

## 2015-11-15 NOTE — Patient Instructions (Signed)
YOU HAD AN ENDOSCOPIC PROCEDURE TODAY AT [facility]:   Refer to the procedure report that was given to you for any specific questions about what was found during the examination.  If the procedure report does not answer your questions, please call your gastroenterologist to clarify.  If you requested that your care partner not be given the details of your procedure findings, then the procedure report has been included in a sealed envelope for you to review at your convenience later.  YOU SHOULD EXPECT: Some feelings of bloating in the abdomen. Passage of more gas than usual.  Walking can help get rid of the air that was put into your GI tract during the procedure and reduce the bloating. If you had a lower endoscopy (such as a colonoscopy or flexible sigmoidoscopy) you may notice spotting of blood in your stool or on the toilet paper. If you underwent a bowel prep for your procedure, you may not have a normal bowel movement for a few days.  Please Note:  You might notice some irritation and congestion in your nose or some drainage.  This is from the oxygen used during your procedure.  There is no need for concern and it should clear up in a day or so.  SYMPTOMS TO REPORT IMMEDIATELY:   Following lower endoscopy (colonoscopy or flexible sigmoidoscopy):  Excessive amounts of blood in the stool  Significant tenderness or worsening of abdominal pains  Swelling of the abdomen that is new, acute  Fever of 100F or higher   For urgent or emergent issues, a gastroenterologist can be reached at any hour by calling [phone].   DIET: Your first meal following the procedure should be a small meal and then it is ok to progress to your normal diet. Heavy or fried foods are harder to digest and may make you feel nauseous or bloated.  Likewise, meals heavy in dairy and vegetables can increase bloating.  Drink plenty of fluids but you should avoid alcoholic beverages for 24  hours.  ACTIVITY:  You should plan to take it easy for the rest of today and you should NOT DRIVE or use heavy machinery until tomorrow (because of the sedation medicines used during the test).    FOLLOW UP: Our staff will call the number listed on your records the next business day following your procedure to check on you and address any questions or concerns that you may have regarding the information given to you following your procedure. If we do not reach you, we will leave a message.  However, if you are feeling well and you are not experiencing any problems, there is no need to return our call.  We will assume that you have returned to your regular daily activities without incident.  If any biopsies were taken you will be contacted by phone or by letter within the next 1-3 weeks.  Please call us at [phone] if you have not heard about the biopsies in 3 weeks.    SIGNATURES/CONFIDENTIALITY: You and/or your care partner have signed paperwork which will be entered into your electronic medical record.  These signatures attest to the fact that that the information above on your After Visit Summary has been reviewed and is understood.  Full responsibility of the confidentiality of this discharge information lies with you and/or your care-partner.    Handouts were given to your care partner on diverticulosis and a high fiber diet with liberal fluid intake. Your blood sugar was  123 in the recovery room. You may resume your current medications today. Please see schedule for colonoscopy and pre- visit. Please call if any questions or concerns.

## 2015-11-15 NOTE — Progress Notes (Signed)
A/ox3, pleased with MAC, report to RN 

## 2015-11-15 NOTE — Progress Notes (Addendum)
Pt had a poor prep with suprep.  Incomplete colon.  She was rescheduled for a pre-visit with a 2 day prep and also rescheduled her for another colonoscopy.  No complaints noted in the recovery room. maw

## 2015-11-16 ENCOUNTER — Telehealth: Payer: Self-pay | Admitting: *Deleted

## 2015-11-16 NOTE — Telephone Encounter (Signed)
  Follow up Call-  Call back number 11/15/2015  Post procedure Call Back phone  # [phone]  Permission to leave phone message Yes     Patient questions:  Do you have a fever, pain , or abdominal swelling? No. Pain Score  0 *  Have you tolerated food without any problems? Yes.    Have you been able to return to your normal activities? Yes.    Do you have any questions about your discharge instructions: Diet   No. Medications  No. Follow up visit  No.  Do you have questions or concerns about your Care? No.  Actions: * If pain score is 4 or above: No action needed, pain <4.

## 2015-11-17 ENCOUNTER — Telehealth: Payer: Self-pay | Admitting: Internal Medicine

## 2015-11-22 NOTE — Telephone Encounter (Signed)
Left message that I would call patient closer to her colonoscopy appointment to let her know when she could come and pick up a prep sample

## 2015-12-06 ENCOUNTER — Telehealth: Payer: Self-pay | Admitting: Internal Medicine

## 2015-12-06 NOTE — Telephone Encounter (Signed)
Spoke with patient and told her I would leave a sample Suprep upstairs in [facility] previsit room for her appointment tomorrow.  Patient acknowledged and understood.

## 2015-12-07 ENCOUNTER — Ambulatory Visit (AMBULATORY_SURGERY_CENTER): Payer: Self-pay | Admitting: *Deleted

## 2015-12-07 VITALS — Ht 69.0 in | Wt 191.0 lb

## 2015-12-07 DIAGNOSIS — Z1211 Encounter for screening for malignant neoplasm of colon: Secondary | ICD-10-CM

## 2015-12-07 MED ORDER — NA SULFATE-K SULFATE-MG SULF 17.5-3.13-1.6 GM/177ML PO SOLN: 1.0000 | Freq: Once | ORAL | Status: DC

## 2015-12-07 NOTE — Progress Notes (Signed)
No egg or soy allergy known to patient  No issues with past sedation with any surgeries  or procedures, no intubation problems  No diet pills per patient No home 02 use per patient  No blood thinners per patient  Pt denies issues with constipation  Did well with propofol with last procedure  Husband with pt in [place], she states she has hard to soft stools- 2 day prep as per Dr Hilarie Fredrickson - told pt she could try a daily stool softener starting 5 days before her colon- continue probiotic daily

## 2015-12-08 ENCOUNTER — Encounter: Payer: Self-pay | Admitting: Internal Medicine

## 2015-12-17 ENCOUNTER — Encounter: Payer: Self-pay | Admitting: Internal Medicine

## 2015-12-21 ENCOUNTER — Ambulatory Visit (AMBULATORY_SURGERY_CENTER): Payer: Medicare Other | Admitting: Internal Medicine

## 2015-12-21 ENCOUNTER — Encounter: Payer: Self-pay | Admitting: Internal Medicine

## 2015-12-21 VITALS — BP 103/64 | HR 63 | Temp 96.8°F | Resp 18 | Ht 69.0 in | Wt 191.0 lb

## 2015-12-21 DIAGNOSIS — D120 Benign neoplasm of cecum: Secondary | ICD-10-CM | POA: Diagnosis not present

## 2015-12-21 DIAGNOSIS — D124 Benign neoplasm of descending colon: Secondary | ICD-10-CM | POA: Diagnosis not present

## 2015-12-21 DIAGNOSIS — D122 Benign neoplasm of ascending colon: Secondary | ICD-10-CM

## 2015-12-21 DIAGNOSIS — Z1211 Encounter for screening for malignant neoplasm of colon: Secondary | ICD-10-CM

## 2015-12-21 DIAGNOSIS — D127 Benign neoplasm of rectosigmoid junction: Secondary | ICD-10-CM | POA: Diagnosis not present

## 2015-12-21 MED ORDER — SODIUM CHLORIDE 0.9 % IV SOLN: 500.0000 mL | INTRAVENOUS | Status: DC

## 2015-12-21 NOTE — Progress Notes (Signed)
Report to PACU, RN, vss, BBS= Clear.  

## 2015-12-21 NOTE — Op Note (Signed)
[place] Patient Name: Samantha Snyder Procedure Date: 12/21/2015 7:13 AM MRN: [id number] Endoscopist: Jerene Bears , MD Age: 70 Referring MD:  Date of Birth: 08-20-1945 Gender: Female Account #: [id number] Procedure:                Colonoscopy Indications:              Screening for colorectal malignant neoplasm, Last                            colonoscopy 10 years ago Medicines:                Monitored Anesthesia Care Procedure:                Pre-Anesthesia Assessment:                           - Prior to the procedure, a History and Physical                            was performed, and patient medications and                            allergies were reviewed. The patient's tolerance of                            previous anesthesia was also reviewed. The risks                            and benefits of the procedure and the sedation                            options and risks were discussed with the patient.                            All questions were answered, and informed consent                            was obtained. Prior Anticoagulants: The patient has                            taken no previous anticoagulant or antiplatelet                            agents. ASA Grade Assessment: II - A patient with                            mild systemic disease. After reviewing the risks                            and benefits, the patient was deemed in                            satisfactory condition to undergo the procedure.  After obtaining informed consent, the colonoscope                            was passed under direct vision. Throughout the                            procedure, the patient's blood pressure, pulse, and                            oxygen saturations were monitored continuously. The                            Model PCF-[id number]) scope was introduced                            through the anus and advanced to the  the cecum,                            identified by appendiceal orifice and ileocecal                            valve. The colonoscopy was performed without                            difficulty. The patient tolerated the procedure                            well. The quality of the bowel preparation was                            good. The ileocecal valve, appendiceal orifice, and                            rectum were photographed. The bowel preparation                            used was Miralax and SUPREP (2 day). Scope In: 8:06:33 AM Scope Out: 8:26:16 AM Scope Withdrawal Time: 0 hours 15 minutes 59 seconds  Total Procedure Duration: 0 hours 19 minutes 43 seconds  Findings:                 The digital rectal exam was normal.                           A 1 mm polyp was found in the cecum. The polyp was                            sessile. The polyp was removed with a cold snare.                            Resection and retrieval were complete.                           Two sessile polyps were found  in the ascending                            colon. The polyps were 4 to 5 mm in size. These                            polyps were removed with a cold snare. Resection                            and retrieval were complete.                           A 6 mm polyp was found in the descending colon. The                            polyp was sessile. The polyp was removed with a                            cold snare. Resection and retrieval were complete.                           A 5 mm polyp was found in the recto-sigmoid colon.                            The polyp was sessile. The polyp was removed with a                            cold snare. Resection and retrieval were complete.                           Multiple small and large-mouthed diverticula were                            found from ascending colon to sigmoid colon. Complications:            No immediate complications. Estimated  Blood Loss:     Estimated blood loss: none. Impression:               - One 1 mm polyp in the cecum, removed with a cold                            snare. Resected and retrieved.                           - Two 4 to 5 mm polyps in the ascending colon,                            removed with a cold snare. Resected and retrieved.                           - One 6 mm polyp in the descending colon, removed  with a cold snare. Resected and retrieved.                           - One 5 mm polyp at the recto-sigmoid colon,                            removed with a cold snare. Resected and retrieved.                           - Severe diverticulosis from ascending colon to                            sigmoid colon. Recommendation:           - Patient has a contact number available for                            emergencies. The signs and symptoms of potential                            delayed complications were discussed with the                            patient. Return to normal activities tomorrow.                            Written discharge instructions were provided to the                            patient.                           - Resume previous diet.                           - Continue present medications.                           - Await pathology results.                           - Repeat colonoscopy is recommended for                            surveillance. The colonoscopy date will be                            determined after pathology results from today's                            exam become available for review. Jerene Bears, MD 12/21/2015 8:34:59 AM This report has been signed electronically.

## 2015-12-21 NOTE — Progress Notes (Signed)
Called to room to assist during endoscopic procedure.  Patient ID and intended procedure confirmed with present staff. Received instructions for my participation in the procedure from the performing physician.  

## 2015-12-21 NOTE — Patient Instructions (Signed)
YOU HAD AN ENDOSCOPIC PROCEDURE TODAY AT THE [facility]:   Refer to the procedure report that was given to you for any specific questions about what was found during the examination.  If the procedure report does not answer your questions, please call your gastroenterologist to clarify.  If you requested that your care partner not be given the details of your procedure findings, then the procedure report has been included in a sealed envelope for you to review at your convenience later.  YOU SHOULD EXPECT: Some feelings of bloating in the abdomen. Passage of more gas than usual.  Walking can help get rid of the air that was put into your GI tract during the procedure and reduce the bloating. If you had a lower endoscopy (such as a colonoscopy or flexible sigmoidoscopy) you may notice spotting of blood in your stool or on the toilet paper. If you underwent a bowel prep for your procedure, you may not have a normal bowel movement for a few days.  Please Note:  You might notice some irritation and congestion in your nose or some drainage.  This is from the oxygen used during your procedure.  There is no need for concern and it should clear up in a day or so.  SYMPTOMS TO REPORT IMMEDIATELY:   Following lower endoscopy (colonoscopy or flexible sigmoidoscopy):  Excessive amounts of blood in the stool  Significant tenderness or worsening of abdominal pains  Swelling of the abdomen that is new, acute  Fever of 100F or higher    For urgent or emergent issues, a gastroenterologist can be reached at any hour by calling [phone].   DIET: Your first meal following the procedure should be a small meal and then it is ok to progress to your normal diet. Heavy or fried foods are harder to digest and may make you feel nauseous or bloated.  Likewise, meals heavy in dairy and vegetables can increase bloating.  Drink plenty of fluids but you should avoid alcoholic beverages for 24  hours.  ACTIVITY:  You should plan to take it easy for the rest of today and you should NOT DRIVE or use heavy machinery until tomorrow (because of the sedation medicines used during the test).    FOLLOW UP: Our staff will call the number listed on your records the next business day following your procedure to check on you and address any questions or concerns that you may have regarding the information given to you following your procedure. If we do not reach you, we will leave a message.  However, if you are feeling well and you are not experiencing any problems, there is no need to return our call.  We will assume that you have returned to your regular daily activities without incident.  If any biopsies were taken you will be contacted by phone or by letter within the next 1-3 weeks.  Please call us at [phone] if you have not heard about the biopsies in 3 weeks.    SIGNATURES/CONFIDENTIALITY: You and/or your care partner have signed paperwork which will be entered into your electronic medical record.  These signatures attest to the fact that that the information above on your After Visit Summary has been reviewed and is understood.  Full responsibility of the confidentiality of this discharge information lies with you and/or your care-partner.   Resume medications. Information given on polyps,diverticulosis and high fiber diet. 

## 2015-12-22 ENCOUNTER — Telehealth: Payer: Self-pay

## 2015-12-22 NOTE — Telephone Encounter (Signed)
  Follow up Call-  Call back number 12/21/2015 11/15/2015  Post procedure Call Back phone  # [phone]  Permission to leave phone message Yes Yes     Patient questions:  Do you have a fever, pain , or abdominal swelling? No. Pain Score  0 *  Have you tolerated food without any problems? Yes.    Have you been able to return to your normal activities? Yes.    Do you have any questions about your discharge instructions: Diet   No. Medications  No. Follow up visit  No.  Do you have questions or concerns about your Care? No.  Actions: * If pain score is 4 or above: No action needed, pain <4.  No problems per the pt.  She wanted to thank everyone for being so kind and making it a pleasant experience. maw

## 2015-12-29 ENCOUNTER — Encounter: Payer: Self-pay | Admitting: Internal Medicine

## 2016-01-24 ENCOUNTER — Ambulatory Visit (INDEPENDENT_AMBULATORY_CARE_PROVIDER_SITE_OTHER): Payer: Medicare Other | Admitting: Family Medicine

## 2016-01-24 ENCOUNTER — Encounter: Payer: Self-pay | Admitting: Family Medicine

## 2016-01-24 VITALS — BP 124/64 | HR 93 | Temp 98.8°F | Ht 69.0 in | Wt 194.0 lb

## 2016-01-24 DIAGNOSIS — J019 Acute sinusitis, unspecified: Secondary | ICD-10-CM

## 2016-01-24 MED ORDER — AZITHROMYCIN 250 MG PO TABS: ORAL_TABLET | ORAL | 0 refills | Status: DC

## 2016-01-24 NOTE — Progress Notes (Signed)
Pre visit review using our clinic review tool, if applicable. No additional management support is needed unless otherwise documented below in the visit note. 

## 2016-01-24 NOTE — Progress Notes (Signed)
   Subjective:    Patient ID: Samantha Snyder, female    DOB: 1945/12/15, 70 y.o.   MRN: [id number]  HPI Here for 4 days of stuffy head, PND, ST, and a dry cough. On Allegra and Flonase.    Review of Systems  Constitutional: Negative.   HENT: Positive for congestion, postnasal drip, sinus pressure and sore throat. Negative for ear pain.   Eyes: Negative.   Respiratory: Positive for cough. Negative for shortness of breath and wheezing.        Objective:   Physical Exam  Constitutional: She appears well-developed and well-nourished.  HENT:  Right Ear: External ear normal.  Left Ear: External ear normal.  Nose: Nose normal.  Mouth/Throat: Oropharynx is clear and moist.  Eyes: Conjunctivae are normal.  Neck: No thyromegaly present.  Cardiovascular: Normal rate, regular rhythm, normal heart sounds and intact distal pulses.   Pulmonary/Chest: Effort normal and breath sounds normal. No respiratory distress. She has no wheezes. She has no rales.  Lymphadenopathy:    She has no cervical adenopathy.          Assessment & Plan:  Sinusitis, treat with a Zpack.  Laurey Morale, MD

## 2016-03-07 ENCOUNTER — Ambulatory Visit (INDEPENDENT_AMBULATORY_CARE_PROVIDER_SITE_OTHER): Payer: Medicare Other | Admitting: Family Medicine

## 2016-03-07 ENCOUNTER — Encounter: Payer: Self-pay | Admitting: Family Medicine

## 2016-03-07 VITALS — BP 130/84 | HR 81 | Temp 98.3°F | Wt 192.4 lb

## 2016-03-07 DIAGNOSIS — E119 Type 2 diabetes mellitus without complications: Secondary | ICD-10-CM

## 2016-03-07 DIAGNOSIS — E1142 Type 2 diabetes mellitus with diabetic polyneuropathy: Secondary | ICD-10-CM | POA: Diagnosis not present

## 2016-03-07 DIAGNOSIS — Z23 Encounter for immunization: Secondary | ICD-10-CM

## 2016-03-07 DIAGNOSIS — Z794 Long term (current) use of insulin: Secondary | ICD-10-CM

## 2016-03-07 DIAGNOSIS — M8580 Other specified disorders of bone density and structure, unspecified site: Secondary | ICD-10-CM | POA: Diagnosis not present

## 2016-03-07 DIAGNOSIS — Z1239 Encounter for other screening for malignant neoplasm of breast: Secondary | ICD-10-CM

## 2016-03-07 DIAGNOSIS — E7800 Pure hypercholesterolemia, unspecified: Secondary | ICD-10-CM

## 2016-03-07 LAB — POCT GLYCOSYLATED HEMOGLOBIN (HGB A1C): HEMOGLOBIN A1C: 6.5

## 2016-03-07 NOTE — Assessment & Plan Note (Signed)
1 PPD. Advised cessation- not ready to quit. Has tried patch in past but gets rash.

## 2016-03-07 NOTE — Progress Notes (Signed)
Subjective:  Samantha Snyder is a 70 y.o. year old very pleasant female patient who presents for/with See problem oriented charting ROS- No chest pain or shortness of breath- doing well with her walking. No headache or blurry vision.see any ROS included in HPI as well.   Past Medical History-  Patient Active Problem List   Diagnosis Date Noted  . DM (diabetes mellitus) type II controlled, neurological manifestation ([place]) 02/24/2014    Priority: High  . Tobacco abuse 02/24/2014    Priority: High  . Osteopenia 03/30/2014    Priority: Medium  . Essential hypertension, benign 03/08/2014    Priority: Medium  . High cholesterol 02/24/2014    Priority: Medium  . Diabetic peripheral neuropathy ([place]) 10/27/2014    Priority: Low  . Seasonal allergies 02/24/2014    Priority: Low    Medications- reviewed and updated Current Outpatient Prescriptions  Medication Sig Dispense Refill  . ACCU-CHEK FASTCLIX LANCETS MISC Use to check blood sugars 3-4 times daily. Dx:E11.9 400 each 10  . aspirin 81 MG tablet Take 81 mg by mouth daily.    [name] atorvastatin (LIPITOR) 20 MG tablet Take 1 tablet (20 mg total) by mouth daily. 90 tablet 3  . Calcium Carbonate-Vit D-Min (CALCIUM 1200 PO) Take 2 tablets by mouth.    . Cholecalciferol (VITAMIN D3) 2000 UNITS TABS Take 1 tablet by mouth.    . fexofenadine (ALLEGRA) 180 MG tablet Take 180 mg by mouth daily.    [name] glucose blood (ACCU-CHEK SMARTVIEW) test strip Use to test blood sugars 3-4 times daily. E11.9 400 each 10  . ibuprofen (ADVIL,MOTRIN) 400 MG tablet Take 400 mg by mouth at bedtime as needed. Reported on 09/04/2015    . lisinopril (PRINIVIL,ZESTRIL) 5 MG tablet Take 1 tablet (5 mg total) by mouth daily. 90 tablet 3  . Magnesium 500 MG CAPS Take 1 capsule by mouth.    . metFORMIN (GLUCOPHAGE) 1000 MG tablet Take 1 tablet (1,000 mg total) by mouth 2 (two) times daily with a meal. 180 tablet 3  . Multiple Vitamins-Minerals (MULTIVITAMIN PO) Take 1 tablet by  mouth.    . Omega-3 Fatty Acids (FISH OIL PO) Take 2 tablets by mouth 2 (two) times daily. 1,000 mg    . OVER THE COUNTER MEDICATION Herbal tea 8 ounces daily    . Probiotic Product (PROBIOTIC DAILY PO) Take 1 tablet by mouth.    . Wheat Dextrin (BENEFIBER PO) Take 1 tablet by mouth.     No current facility-administered medications for this visit.     Objective: BP 130/84 (BP Location: Left Arm, Patient Position: Sitting, Cuff Size: Normal)   Pulse 81   Temp 98.3 F (36.8 C) (Oral)   Wt 192 lb 6.4 oz (87.3 kg)   SpO2 95%   BMI 28.41 kg/m  Gen: NAD, resting comfortably CV: RRR no murmurs rubs or gallops Lungs: CTAB no crackles, wheeze, rhonchi Abdomen: soft/nontender/nondistended/normal bowel sounds. Overweight.  Ext: no edema Skin: warm, dry, no rash Neuro: grossly normal, moves all extremities, normal gait  Diabetic Foot Exam - unchanged from previous  Simple Foot Form  Diabetic Foot exam was performed with the following findings:  Yes   Visual Inspection  No deformities, no ulcerations, no other skin breakdown bilaterally:  Yes  Sensation Testing  Intact to touch and monofilament testing bilaterally:  Yes  Pulse Check  Posterior Tibialis and Dorsalis pulse intact bilaterally:  Yes  Comments  Does have large callus great toe bilaterally. Reduced sensation distally but  still feels    Assessment/Plan:  Tobacco abuse 1 PPD. Advised cessation- not ready to quit. Has tried patch in past but gets rash.   DM (diabetes mellitus) type II controlled, neurological manifestation S: well controlled. On metformin 1g BID.  Exercise and diet- started walking 40 minutes at least 4 days a week.  Lab Results  Component Value Date   HGBA1C 6.9 (H) 08/28/2015   HGBA1C 6.8 (H) 03/02/2015   HGBA1C 6.7 (H) 10/27/2014   A/P: a1c improved today to 6.5! Great job with exercise- continue current regimen.    Osteopenia S: DEXA October 2015 lumbar spine at -2.4. Compliant with calcium/vit  D A/P: repeat DEXA but needs to be after 03/30/17   High cholesterol S: well controlled on lipitor 20mg . No myalgias.  Lab Results  Component Value Date   CHOL 128 08/28/2015   HDL 40.70 08/28/2015   LDLCALC 47 08/28/2015   TRIG 199.0 (H) 08/28/2015   CHOLHDL 3 08/28/2015   A/P: continue current meds   Diabetic peripheral neuropathy ([facility]) S: trial of gabapentin failed- no improvement in pain and back to ibuprofen A/P: consider trial again with quicker titration in first especially if changes in kidney function or if CV issues in future  6 months CPE  Orders Placed This Encounter  Procedures  . DG Bone Density    Standing Status:   Future    Standing Expiration Date:   05/07/2017    Order Specific Question:   Reason for Exam (SYMPTOM  OR DIAGNOSIS REQUIRED)    Answer:   follow up osteopenia. repeat DEXA but needs to be after 03/30/17    Order Specific Question:   Preferred imaging location?    Answer:   [facility]  . MM Digital Screening    Standing Status:   Future    Standing Expiration Date:   05/07/2017    Order Specific Question:   Reason for Exam (SYMPTOM  OR DIAGNOSIS REQUIRED)    Answer:   breast cancer screening    Order Specific Question:   Preferred imaging location?    Answer:   [facility]  . Flu vaccine HIGH DOSE PF  . Td : Tetanus/diphtheria >7yo Preservative  free  . POCT glycosylated hemoglobin (Hb A1C)   Return precautions advised.  Garret Reddish, MD

## 2016-03-07 NOTE — Assessment & Plan Note (Signed)
S: well controlled on lipitor 20mg . No myalgias.  Lab Results  Component Value Date   CHOL 128 08/28/2015   HDL 40.70 08/28/2015   LDLCALC 47 08/28/2015   TRIG 199.0 (H) 08/28/2015   CHOLHDL 3 08/28/2015   A/P: continue current meds

## 2016-03-07 NOTE — Assessment & Plan Note (Signed)
S: well controlled. On metformin 1g BID.  Exercise and diet- started walking 40 minutes at least 4 days a week.  Lab Results  Component Value Date   HGBA1C 6.9 (H) 08/28/2015   HGBA1C 6.8 (H) 03/02/2015   HGBA1C 6.7 (H) 10/27/2014   A/P: a1c improved today to 6.5! Great job with exercise- continue current regimen.

## 2016-03-07 NOTE — Assessment & Plan Note (Signed)
S: trial of gabapentin failed- no improvement in pain and back to ibuprofen A/P: consider trial again with quicker titration in first especially if changes in kidney function or if CV issues in future

## 2016-03-07 NOTE — Assessment & Plan Note (Signed)
S: DEXA October 2015 lumbar spine at -2.4. Compliant with calcium/vit D A/P: repeat DEXA but needs to be after 03/30/17

## 2016-03-07 NOTE — Progress Notes (Signed)
Pre visit review using our clinic review tool, if applicable. No additional management support is needed unless otherwise documented below in the visit note. 

## 2016-03-07 NOTE — Patient Instructions (Addendum)
High dose flu shot  Td (tetanus alone)  As always- advise you to quit smoking  a1c 6.5 looks good- keep up walking  Call breast center about mammogram and bone density- repeat bone density needs to be after 03/30/17

## 2016-03-13 ENCOUNTER — Other Ambulatory Visit: Payer: Self-pay | Admitting: Family Medicine

## 2016-03-13 DIAGNOSIS — Z1231 Encounter for screening mammogram for malignant neoplasm of breast: Secondary | ICD-10-CM

## 2016-04-05 ENCOUNTER — Ambulatory Visit
Admission: RE | Admit: 2016-04-05 | Discharge: 2016-04-05 | Disposition: A | Discharge status: Home / Self Care | Payer: Medicare Other | Source: Ambulatory Visit | Attending: Family Medicine | Admitting: Family Medicine

## 2016-04-05 DIAGNOSIS — Z1231 Encounter for screening mammogram for malignant neoplasm of breast: Secondary | ICD-10-CM

## 2016-04-05 DIAGNOSIS — M8580 Other specified disorders of bone density and structure, unspecified site: Secondary | ICD-10-CM

## 2016-04-18 LAB — HM DIABETES EYE EXAM

## 2016-04-19 ENCOUNTER — Encounter: Payer: Self-pay | Admitting: Family Medicine

## 2016-05-01 ENCOUNTER — Telehealth: Payer: Self-pay | Admitting: Acute Care

## 2016-05-01 DIAGNOSIS — F17210 Nicotine dependence, cigarettes, uncomplicated: Secondary | ICD-10-CM

## 2016-05-01 NOTE — Telephone Encounter (Signed)
This note is documentation of a call made on 09/15/2015 to Mrs. Samantha Snyder with the results of her low-dose screening CT. Her scan was read as a Lung RADS 1, negative study: no nodules or definitely benign nodules. Radiology recommendation is for a repeat LDCT in 12 months. We will order and schedule the scan from March 2018. 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 Mrs. Samantha Snyder verbalized understanding of the above and had no further questions at completion of the call.

## 2016-06-20 ENCOUNTER — Other Ambulatory Visit: Payer: Self-pay | Admitting: Family Medicine

## 2016-09-09 ENCOUNTER — Ambulatory Visit (INDEPENDENT_AMBULATORY_CARE_PROVIDER_SITE_OTHER): Payer: Medicare Other | Admitting: Family Medicine

## 2016-09-09 ENCOUNTER — Encounter: Payer: Self-pay | Admitting: Family Medicine

## 2016-09-09 VITALS — BP 132/76 | HR 79 | Temp 98.1°F | Ht 69.0 in | Wt 197.6 lb

## 2016-09-09 DIAGNOSIS — M2041 Other hammer toe(s) (acquired), right foot: Secondary | ICD-10-CM | POA: Diagnosis not present

## 2016-09-09 DIAGNOSIS — E1142 Type 2 diabetes mellitus with diabetic polyneuropathy: Secondary | ICD-10-CM | POA: Diagnosis not present

## 2016-09-09 DIAGNOSIS — M21619 Bunion of unspecified foot: Secondary | ICD-10-CM | POA: Diagnosis not present

## 2016-09-09 DIAGNOSIS — M2042 Other hammer toe(s) (acquired), left foot: Secondary | ICD-10-CM

## 2016-09-09 DIAGNOSIS — I10 Essential (primary) hypertension: Secondary | ICD-10-CM | POA: Diagnosis not present

## 2016-09-09 DIAGNOSIS — E7800 Pure hypercholesterolemia, unspecified: Secondary | ICD-10-CM | POA: Diagnosis not present

## 2016-09-09 DIAGNOSIS — Z720 Tobacco use: Secondary | ICD-10-CM | POA: Diagnosis not present

## 2016-09-09 LAB — COMPREHENSIVE METABOLIC PANEL
ALBUMIN: 4.4 g/dL (ref 3.5–5.2)
ALK PHOS: 102 U/L (ref 39–117)
ALT: 25 U/L (ref 0–35)
AST: 18 U/L (ref 0–37)
BUN: 19 mg/dL (ref 6–23)
CALCIUM: 9.7 mg/dL (ref 8.4–10.5)
CO2: 25 mEq/L (ref 19–32)
Chloride: 109 mEq/L (ref 96–112)
Creatinine, Ser: 0.58 mg/dL (ref 0.40–1.20)
GFR: 109.53 mL/min (ref >60.00)
Glucose, Bld: 139 mg/dL — ABNORMAL HIGH (ref 70–99)
POTASSIUM: 4.6 meq/L (ref 3.5–5.1)
SODIUM: 141 meq/L (ref 135–145)
TOTAL PROTEIN: 6.6 g/dL (ref 6.0–8.3)
Total Bilirubin: 0.4 mg/dL (ref 0.2–1.2)

## 2016-09-09 LAB — LIPID PANEL
CHOLESTEROL: 134 mg/dL (ref 0–200)
HDL: 47.1 mg/dL (ref >39.00)
LDL Cholesterol: 51 mg/dL (ref 0–99)
NonHDL: 86.63
Total CHOL/HDL Ratio: 3
Triglycerides: 177 mg/dL — ABNORMAL HIGH (ref 0.0–149.0)
VLDL: 35.4 mg/dL (ref 0.0–40.0)

## 2016-09-09 LAB — CBC
HEMATOCRIT: 46 % (ref 36.0–46.0)
Hemoglobin: 15.3 g/dL — ABNORMAL HIGH (ref 12.0–15.0)
MCHC: 33.2 g/dL (ref 30.0–36.0)
MCV: 90.3 fl (ref 78.0–100.0)
PLATELETS: 361 10*3/uL (ref 150.0–400.0)
RBC: 5.09 Mil/uL (ref 3.87–5.11)
RDW: 14.9 % (ref 11.5–15.5)
WBC: 9.1 10*3/uL (ref 4.0–10.5)

## 2016-09-09 LAB — HEMOGLOBIN A1C: HEMOGLOBIN A1C: 7.2 % — AB (ref 4.6–6.5)

## 2016-09-09 NOTE — Assessment & Plan Note (Signed)
S: well controlled. On metformin 1g BID in past. Weight has slipped 5 lbs though Lab Results  Component Value Date   HGBA1C 6.5 03/07/2016   HGBA1C 6.9 (H) 08/28/2015   HGBA1C 6.8 (H) 03/02/2015   A/P: update a1c- discussed watching weight curve

## 2016-09-09 NOTE — Assessment & Plan Note (Signed)
S: controlled on lisinopril 5mg  BP Readings from Last 3 Encounters:  09/09/16 132/76  03/07/16 130/84  01/24/16 124/64  A/P:Continue current medications

## 2016-09-09 NOTE — Progress Notes (Signed)
Subjective:  Samantha Snyder is a 71 y.o. year old very pleasant female patient who presents for/with See problem oriented charting ROS- No chest pain or shortness of breath. No headache or blurry vision.  Does get pain in feet at night  Past Medical History-  Patient Active Problem List   Diagnosis Date Noted  . DM (diabetes mellitus) type II controlled, neurological manifestation ([place]) 02/24/2014    Priority: High  . Tobacco abuse 02/24/2014    Priority: High  . Osteopenia 03/30/2014    Priority: Medium  . Essential hypertension, benign 03/08/2014    Priority: Medium  . High cholesterol 02/24/2014    Priority: Medium  . Diabetic peripheral neuropathy ([place]) 10/27/2014    Priority: Low  . Seasonal allergies 02/24/2014    Priority: Low    Medications- reviewed and updated Current Outpatient Prescriptions  Medication Sig Dispense Refill  . ACCU-CHEK FASTCLIX LANCETS MISC Use to check blood sugars 3-4 times daily. Dx:E11.9 400 each 10  . aspirin 81 MG tablet Take 81 mg by mouth daily.    [name] atorvastatin (LIPITOR) 20 MG tablet Take 1 tablet (20 mg total) by mouth daily. 90 tablet 3  . Calcium Carbonate-Vit D-Min (CALCIUM 1200 PO) Take 2 tablets by mouth.    . Cholecalciferol (VITAMIN D3) 2000 UNITS TABS Take 1 tablet by mouth.    . fexofenadine (ALLEGRA) 180 MG tablet Take 180 mg by mouth daily.    [name] glucose blood (ACCU-CHEK SMARTVIEW) test strip Use to test blood sugars 3-4 times daily. E11.9 400 each 10  . ibuprofen (ADVIL,MOTRIN) 400 MG tablet Take 400 mg by mouth at bedtime as needed. Reported on 09/04/2015    . lisinopril (PRINIVIL,ZESTRIL) 5 MG tablet Take 1 tablet (5 mg total) by mouth daily. 90 tablet 3  . Magnesium 500 MG CAPS Take 1 capsule by mouth.    . metFORMIN (GLUCOPHAGE) 1000 MG tablet Take 1 tablet (1,000 mg total) by mouth 2 (two) times daily with a meal. 180 tablet 3  . Multiple Vitamins-Minerals (MULTIVITAMIN PO) Take 1 tablet by mouth.    . Omega-3 Fatty Acids (FISH  OIL PO) Take 2 tablets by mouth 2 (two) times daily. 1,000 mg    . OVER THE COUNTER MEDICATION Herbal tea 8 ounces daily    . Probiotic Product (PROBIOTIC DAILY PO) Take 1 tablet by mouth.    . Wheat Dextrin (BENEFIBER PO) Take 1 tablet by mouth.     No current facility-administered medications for this visit.     Objective: BP 132/76 (BP Location: Left Arm, Patient Position: Sitting, Cuff Size: Large)   Pulse 79   Temp 98.1 F (36.7 C) (Oral)   Ht 5\' 9"  (1.753 m)   Wt 197 lb 9.6 oz (89.6 kg)   SpO2 92%   BMI 29.18 kg/m  Gen: NAD, resting comfortably, smells of smoke CV: RRR no murmurs rubs or gallops Lungs: CTAB no crackles, wheeze, rhonchi Abdomen: soft/nontender/nondistended/normal bowel sounds. obese Ext: no edema Skin: warm, dry, no rash  Diabetic Foot Exam - Simple   Simple Foot Form Diabetic Foot exam was performed with the following findings:  Yes 09/09/2016  9:43 AM  Visual Inspection See comments:  Yes Sensation Testing Intact to touch and monofilament testing bilaterally:  Yes Pulse Check Posterior Tibialis and Dorsalis pulse intact bilaterally:  Yes Comments Callous great toe MTP and IP joint bilaterally. Bunion both feet. Hammer toes both feet. Also small callous at MTP 5th toe. Some decreased sensation great toe on  R and L but can still feel with monofilament.     Assessment/Plan:  Bunion of great toe - Plan: Ambulatory referral to Podiatry Hammer toes of both feet - Plan: Ambulatory referral to Podiatry S: states hammer toe starting to overlap great toe due to bunion deformity. Has tried toe separators gel with some relief A/P: wants podiatry referral- provided  Essential hypertension, benign S: controlled on lisinopril 5mg  BP Readings from Last 3 Encounters:  09/09/16 132/76  03/07/16 130/84  01/24/16 124/64  A/P:Continue current medications  High cholesterol S: reasonably controlled on lipitor 20mg . No myalgias.  Lab Results  Component Value  Date   CHOL 128 08/28/2015   HDL 40.70 08/28/2015   LDLCALC 47 08/28/2015   TRIG 199.0 (H) 08/28/2015   CHOLHDL 3 08/28/2015   A/P: continue current medications. Update lipids  DM (diabetes mellitus) type II controlled, neurological manifestation S: well controlled. On metformin 1g BID in past. Weight has slipped 5 lbs though Lab Results  Component Value Date   HGBA1C 6.5 03/07/2016   HGBA1C 6.9 (H) 08/28/2015   HGBA1C 6.8 (H) 03/02/2015   A/P: update a1c- discussed watching weight curve   Diabetic peripheral neuropathy ([facility]) No rx. But patient does need diabetic shoes to prevent worsening.   Tobacco abuse S: at last visit smoking 1 ppd- Persists today . Rash with patch A/P: advised cessation. Discussed alternate of lozenges. Lung cancer screening this week. 1st on looked ok.   Return in about 6 months (around 03/12/2017) for annual wellness visit and can see me same day for physical.  Orders Placed This Encounter  Procedures  . CBC    [place]  . Comprehensive metabolic panel    [place]    Order Specific Question:   Has the patient fasted?    Answer:   No  . Lipid panel    [place]    Order Specific Question:   Has the patient fasted?    Answer:   No  . Hemoglobin A1c    [place]  . Ambulatory referral to Podiatry    Referral Priority:   Routine    Referral Type:   Consultation    Referral Reason:   Specialty Services Required    Requested Specialty:   Podiatry    Number of Visits Requested:   1   Return precautions advised.  Garret Reddish, MD

## 2016-09-09 NOTE — Assessment & Plan Note (Signed)
S: reasonably controlled on lipitor 20mg . No myalgias.  Lab Results  Component Value Date   CHOL 128 08/28/2015   HDL 40.70 08/28/2015   LDLCALC 47 08/28/2015   TRIG 199.0 (H) 08/28/2015   CHOLHDL 3 08/28/2015   A/P: continue current medications. Update lipids

## 2016-09-09 NOTE — Patient Instructions (Addendum)
We will call you within a week or two about your referral to podiatry. If you do not hear within 3 weeks, give [place] a call.   Sign release of information at the check out desk for last eye doctor visit  Need to reverse these extra 5 lbs and get active again!   Please stop by lab before you go

## 2016-09-09 NOTE — Progress Notes (Signed)
Pre visit review using our clinic review tool, if applicable. No additional management support is needed unless otherwise documented below in the visit note. 

## 2016-09-09 NOTE — Assessment & Plan Note (Signed)
S: at last visit smoking 1 ppd- Persists today . Rash with patch A/P: advised cessation. Discussed alternate of lozenges. Lung cancer screening this week. 1st on looked ok.

## 2016-09-09 NOTE — Assessment & Plan Note (Signed)
No rx. But patient does need diabetic shoes to prevent worsening.

## 2016-09-12 ENCOUNTER — Ambulatory Visit (INDEPENDENT_AMBULATORY_CARE_PROVIDER_SITE_OTHER)
Admission: RE | Admit: 2016-09-12 | Discharge: 2016-09-12 | Disposition: A | Discharge status: Home / Self Care | Payer: Medicare Other | Source: Ambulatory Visit | Attending: Acute Care | Admitting: Acute Care

## 2016-09-12 DIAGNOSIS — F17210 Nicotine dependence, cigarettes, uncomplicated: Secondary | ICD-10-CM

## 2016-09-12 DIAGNOSIS — Z87891 Personal history of nicotine dependence: Secondary | ICD-10-CM

## 2016-09-18 ENCOUNTER — Other Ambulatory Visit: Payer: Self-pay | Admitting: Acute Care

## 2016-09-18 DIAGNOSIS — F17210 Nicotine dependence, cigarettes, uncomplicated: Principal | ICD-10-CM

## 2016-09-30 ENCOUNTER — Ambulatory Visit (INDEPENDENT_AMBULATORY_CARE_PROVIDER_SITE_OTHER): Payer: Medicare Other | Admitting: Podiatry

## 2016-09-30 ENCOUNTER — Encounter: Payer: Self-pay | Admitting: Podiatry

## 2016-09-30 ENCOUNTER — Ambulatory Visit (INDEPENDENT_AMBULATORY_CARE_PROVIDER_SITE_OTHER): Payer: Medicare Other

## 2016-09-30 DIAGNOSIS — M21619 Bunion of unspecified foot: Secondary | ICD-10-CM

## 2016-09-30 DIAGNOSIS — E0842 Diabetes mellitus due to underlying condition with diabetic polyneuropathy: Secondary | ICD-10-CM

## 2016-09-30 DIAGNOSIS — S92415A Nondisplaced fracture of proximal phalanx of left great toe, initial encounter for closed fracture: Secondary | ICD-10-CM

## 2016-09-30 NOTE — Progress Notes (Signed)
   Subjective: Patient is a 71 year old female presenting today as a new patient. She reports a left great toe injury. She states she dropped a bag of ice on the left foot on 09/26/16. She reports some bruising of the great toe. She also reports bilateral bunions and hammertoes of the bilateral second toes. She is requesting diabetic shoes.    Objective/Physical Exam General: The patient is alert and oriented x3 in no acute distress.  Dermatology: Skin is warm, dry and supple bilateral lower extremities. Negative for open lesions or macerations.  Vascular: Palpable pedal pulses bilaterally. No edema or erythema noted. Capillary refill within normal limits.  Neurological: Epicritic and protective threshold grossly intact bilaterally.   Musculoskeletal Exam: Pain with palpation to the left great toe. Range of motion within normal limits to all pedal and ankle joints bilateral. Muscle strength 5/5 in all groups bilateral.   Radiographic Exam: Nondisplaced distal phalanx fracture of the left great toe.  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 #2 continue wearing diabetic shoes #3 return to clinic when necessary   Edrick Kins, DPM [facility]  Dr. Edrick Kins, [address]                                        [place], [place] [address]                Office [phone]  Fax [phone]

## 2016-10-28 ENCOUNTER — Telehealth: Payer: Self-pay | Admitting: Family Medicine

## 2016-10-28 NOTE — Telephone Encounter (Signed)
Spoke with patient who did get her immunization today. She got the last one that [facility] had in stock. I am updating her chart to reflect her immunization

## 2016-10-28 NOTE — Telephone Encounter (Signed)
Patient is request an RX for new shingles vaccine to be sent to [facility] on [address]  Contact Info: [phone]

## 2016-12-26 ENCOUNTER — Other Ambulatory Visit: Payer: Self-pay | Admitting: Family Medicine

## 2017-01-03 ENCOUNTER — Other Ambulatory Visit: Payer: Self-pay | Admitting: Family Medicine

## 2017-01-09 ENCOUNTER — Encounter: Payer: Self-pay | Admitting: Family Medicine

## 2017-01-10 ENCOUNTER — Other Ambulatory Visit: Payer: Self-pay

## 2017-01-10 MED ORDER — ACCU-CHEK NANO SMARTVIEW W/DEVICE KIT: PACK | 0 refills | Status: DC

## 2017-01-13 ENCOUNTER — Encounter: Payer: Self-pay | Admitting: Family Medicine

## 2017-01-16 ENCOUNTER — Telehealth: Payer: Self-pay

## 2017-01-16 NOTE — Telephone Encounter (Signed)
Called patient to let her know that her paperwork for Level 4 was complete and that she could pick it up at her convenience. I have faxed it also.

## 2017-02-05 ENCOUNTER — Telehealth: Payer: Self-pay | Admitting: Family Medicine

## 2017-02-05 NOTE — Telephone Encounter (Addendum)
Pt had 2nd shigrix vaccine on 01/30/17 at [facility] in [place]

## 2017-02-05 NOTE — Telephone Encounter (Signed)
Chart updated

## 2017-03-11 ENCOUNTER — Other Ambulatory Visit: Payer: Self-pay | Admitting: Family Medicine

## 2017-03-11 ENCOUNTER — Encounter: Payer: Self-pay | Admitting: Family Medicine

## 2017-03-11 ENCOUNTER — Ambulatory Visit (INDEPENDENT_AMBULATORY_CARE_PROVIDER_SITE_OTHER): Payer: Medicare Other | Admitting: Family Medicine

## 2017-03-11 VITALS — BP 108/60 | HR 77 | Temp 98.2°F | Ht 69.25 in | Wt 196.2 lb

## 2017-03-11 DIAGNOSIS — Z23 Encounter for immunization: Secondary | ICD-10-CM

## 2017-03-11 DIAGNOSIS — R309 Painful micturition, unspecified: Secondary | ICD-10-CM

## 2017-03-11 DIAGNOSIS — Z720 Tobacco use: Secondary | ICD-10-CM | POA: Diagnosis not present

## 2017-03-11 DIAGNOSIS — E1142 Type 2 diabetes mellitus with diabetic polyneuropathy: Secondary | ICD-10-CM

## 2017-03-11 DIAGNOSIS — R609 Edema, unspecified: Secondary | ICD-10-CM | POA: Diagnosis not present

## 2017-03-11 DIAGNOSIS — I10 Essential (primary) hypertension: Secondary | ICD-10-CM

## 2017-03-11 DIAGNOSIS — E7800 Pure hypercholesterolemia, unspecified: Secondary | ICD-10-CM

## 2017-03-11 DIAGNOSIS — Z0000 Encounter for general adult medical examination without abnormal findings: Secondary | ICD-10-CM | POA: Diagnosis not present

## 2017-03-11 DIAGNOSIS — Z1231 Encounter for screening mammogram for malignant neoplasm of breast: Secondary | ICD-10-CM

## 2017-03-11 LAB — COMPREHENSIVE METABOLIC PANEL
ALBUMIN: 4.2 g/dL (ref 3.5–5.2)
ALK PHOS: 101 U/L (ref 39–117)
ALT: 23 U/L (ref 0–35)
AST: 18 U/L (ref 0–37)
BILIRUBIN TOTAL: 0.5 mg/dL (ref 0.2–1.2)
BUN: 20 mg/dL (ref 6–23)
CO2: 27 mEq/L (ref 19–32)
CREATININE: 0.59 mg/dL (ref 0.40–1.20)
Calcium: 9.7 mg/dL (ref 8.4–10.5)
Chloride: 105 mEq/L (ref 96–112)
GFR: 107.23 mL/min (ref >60.00)
GLUCOSE: 158 mg/dL — AB (ref 70–99)
Potassium: 4.4 mEq/L (ref 3.5–5.1)
SODIUM: 140 meq/L (ref 135–145)
TOTAL PROTEIN: 6.5 g/dL (ref 6.0–8.3)

## 2017-03-11 LAB — URINALYSIS, ROUTINE W REFLEX MICROSCOPIC
Bilirubin Urine: NEGATIVE
HGB URINE DIPSTICK: NEGATIVE
Ketones, ur: NEGATIVE
Nitrite: POSITIVE — AB
RBC / HPF: NONE SEEN (ref 0–?)
Specific Gravity, Urine: 1.02 (ref 1.000–1.030)
Total Protein, Urine: NEGATIVE
URINE GLUCOSE: NEGATIVE
Urobilinogen, UA: 0.2 (ref 0.0–1.0)
pH: 5.5 (ref 5.0–8.0)

## 2017-03-11 LAB — LDL CHOLESTEROL, DIRECT: Direct LDL: 73 mg/dL

## 2017-03-11 LAB — CBC
HCT: 44.2 % (ref 36.0–46.0)
Hemoglobin: 14.8 g/dL (ref 12.0–15.0)
MCHC: 33.4 g/dL (ref 30.0–36.0)
MCV: 92.6 fl (ref 78.0–100.0)
Platelets: 380 10*3/uL (ref 150.0–400.0)
RBC: 4.78 Mil/uL (ref 3.87–5.11)
RDW: 14.4 % (ref 11.5–15.5)
WBC: 10.4 10*3/uL (ref 4.0–10.5)

## 2017-03-11 LAB — HEMOGLOBIN A1C: Hgb A1c MFr Bld: 7.1 % — ABNORMAL HIGH (ref 4.6–6.5)

## 2017-03-11 LAB — TSH: TSH: 1.57 u[IU]/mL (ref 0.35–4.50)

## 2017-03-11 LAB — CBC AND DIFFERENTIAL: WBC: 10.4

## 2017-03-11 NOTE — Assessment & Plan Note (Signed)
HLD- on lipitor 20mg , update lipids. Last LDL <70 at 53

## 2017-03-11 NOTE — Patient Instructions (Addendum)
6 month follow up unless a1c over 7 would see back in 14 weeks to 4 months. Likely would add medicine if still above 7  I want you to start exercising 30 minutes 5 days a week eventually- build up slowly to this  Make your plates smaller. Try to bump veggies to 3-5 servings a day (half your dinner plate)  Thanks for getting your flu shots   Please stop by lab before you go    Samantha Snyder , Thank you for taking time to come for your Medicare Wellness Visit. I appreciate your ongoing commitment to your health goals. Please review the following plan we discussed and let me know if I can assist you in the future.   These are the goals we discussed: Goals    . Quit smoking / using tobacco          Smoking;  Educated to avoid secondary smoke  Classes offered a couple of times a month;  Will work with the patient as far as registration and location  Meds may help; chatix (Varenicline); Zyban (Bupropion SR); Nicotine Replacement (gum; lozenges; patches; etc.)   30 pack yr smoking hx: Educated regarding LDCT; To discuss with MD at next fup. Also educated on AAA screening for men 65-75 who have smoked  Try to start thinking of your without cigarettes  I can quit          This is a list of the screening recommended for you and due dates:  Health Maintenance  Topic Date Due  . Hemoglobin A1C  03/12/2017  . Eye exam for diabetics  04/18/2017  . Complete foot exam   09/09/2017  . Mammogram  04/05/2018  . Colon Cancer Screening  12/21/2018  . Tetanus Vaccine  03/07/2026  . Flu Shot  Completed  . DEXA scan (bone density measurement)  Completed  .  Hepatitis C: One time screening is recommended by Center for Disease Control  (CDC) for  adults born from [birth year] through 1965.   Completed  . Pneumonia vaccines  Completed    Health Maintenance, Female Adopting a healthy lifestyle and getting preventive care can go a long way to promote health and wellness. Talk with your health care  provider about what schedule of regular examinations is right for you. This is a good chance for you to check in with your provider about disease prevention and staying healthy. In between checkups, there are plenty of things you can do on your own. Experts have done a lot of research about which lifestyle changes and preventive measures are most likely to keep you healthy. Ask your health care provider for more information. Weight and diet Eat a healthy diet  Be sure to include plenty of vegetables, fruits, low-fat dairy products, and lean protein.  Do not eat a lot of foods high in solid fats, added sugars, or salt.  Get regular exercise. This is one of the most important things you can do for your health. ? Most adults should exercise for at least 150 minutes each week. The exercise should increase your heart rate and make you sweat (moderate-intensity exercise). ? Most adults should also do strengthening exercises at least twice a week. This is in addition to the moderate-intensity exercise.  Maintain a healthy weight  Body mass index (BMI) is a measurement that can be used to identify possible weight problems. It estimates body fat based on height and weight. Your health care provider can help determine your BMI and help  you achieve or maintain a healthy weight.  For females 44 years of age and older: ? A BMI below 18.5 is considered underweight. ? A BMI of 18.5 to 24.9 is normal. ? A BMI of 25 to 29.9 is considered overweight. ? A BMI of 30 and above is considered obese.  Watch levels of cholesterol and blood lipids  You should start having your blood tested for lipids and cholesterol at 69 years of age, then have this test every 5 years.  You may need to have your cholesterol levels checked more often if: ? Your lipid or cholesterol levels are high. ? You are older than 69 years of age. ? You are at high risk for heart disease.  Cancer screening Lung Cancer  Lung cancer  screening is recommended for adults 16-52 years old who are at high risk for lung cancer because of a history of smoking.  A yearly low-dose CT scan of the lungs is recommended for people who: ? Currently smoke. ? Have quit within the past 15 years. ? Have at least a 30-pack-year history of smoking. A pack year is smoking an average of one pack of cigarettes a day for 1 year.  Yearly screening should continue until it has been 15 years since you quit.  Yearly screening should stop if you develop a health problem that would prevent you from having lung cancer treatment.  Breast Cancer  Practice breast self-awareness. This means understanding how your breasts normally appear and feel.  It also means doing regular breast self-exams. Let your health care provider know about any changes, no matter how small.  If you are in your 20s or 30s, you should have a clinical breast exam (CBE) by a health care provider every 1-3 years as part of a regular health exam.  If you are 36 or older, have a CBE every year. Also consider having a breast X-ray (mammogram) every year.  If you have a family history of breast cancer, talk to your health care provider about genetic screening.  If you are at high risk for breast cancer, talk to your health care provider about having an MRI and a mammogram every year.  Breast cancer gene (BRCA) assessment is recommended for women who have family members with BRCA-related cancers. BRCA-related cancers include: ? Breast. ? Ovarian. ? Tubal. ? Peritoneal cancers.  Results of the assessment will determine the need for genetic counseling and BRCA1 and BRCA2 testing.  Cervical Cancer Your health care provider may recommend that you be screened regularly for cancer of the pelvic organs (ovaries, uterus, and vagina). This screening involves a pelvic examination, including checking for microscopic changes to the surface of your cervix (Pap test). You may be encouraged to  have this screening done every 3 years, beginning at age 77.  For women ages 4-65, health care providers may recommend pelvic exams and Pap testing every 3 years, or they may recommend the Pap and pelvic exam, combined with testing for human papilloma virus (HPV), every 5 years. Some types of HPV increase your risk of cervical cancer. Testing for HPV may also be done on women of any age with unclear Pap test results.  Other health care providers may not recommend any screening for nonpregnant women who are considered low risk for pelvic cancer and who do not have symptoms. Ask your health care provider if a screening pelvic exam is right for you.  If you have had past treatment for cervical cancer or a condition  that could lead to cancer, you need Pap tests and screening for cancer for at least 20 years after your treatment. If Pap tests have been discontinued, your risk factors (such as having a new sexual partner) need to be reassessed to determine if screening should resume. Some women have medical problems that increase the chance of getting cervical cancer. In these cases, your health care provider may recommend more frequent screening and Pap tests.  Colorectal Cancer  This type of cancer can be detected and often prevented.  Routine colorectal cancer screening usually begins at 69 years of age and continues through 69 years of age.  Your health care provider may recommend screening at an earlier age if you have risk factors for colon cancer.  Your health care provider may also recommend using home test kits to check for hidden blood in the stool.  A small camera at the end of a tube can be used to examine your colon directly (sigmoidoscopy or colonoscopy). This is done to check for the earliest forms of colorectal cancer.  Routine screening usually begins at age 76.  Direct examination of the colon should be repeated every 5-10 years through 69 years of age. However, you may need to be  screened more often if early forms of precancerous polyps or small growths are found.  Skin Cancer  Check your skin from head to toe regularly.  Tell your health care provider about any new moles or changes in moles, especially if there is a change in a mole's shape or color.  Also tell your health care provider if you have a mole that is larger than the size of a pencil eraser.  Always use sunscreen. Apply sunscreen liberally and repeatedly throughout the day.  Protect yourself by wearing long sleeves, pants, a wide-brimmed hat, and sunglasses whenever you are outside.  Heart disease, diabetes, and high blood pressure  High blood pressure causes heart disease and increases the risk of stroke. High blood pressure is more likely to develop in: ? People who have blood pressure in the high end of the normal range (130-139/85-89 mm Hg). ? People who are overweight or obese. ? People who are African American.  If you are 7-29 years of age, have your blood pressure checked every 3-5 years. If you are 4 years of age or older, have your blood pressure checked every year. You should have your blood pressure measured twice-once when you are at a hospital or clinic, and once when you are not at a hospital or clinic. Record the average of the two measurements. To check your blood pressure when you are not at a hospital or clinic, you can use: ? An automated blood pressure machine at a pharmacy. ? A home blood pressure monitor.  If you are between 37 years and 66 years old, ask your health care provider if you should take aspirin to prevent strokes.  Have regular diabetes screenings. This involves taking a blood sample to check your fasting blood sugar level. ? If you are at a normal weight and have a low risk for diabetes, have this test once every three years after 69 years of age. ? If you are overweight and have a high risk for diabetes, consider being tested at a younger age or more  often. Preventing infection Hepatitis B  If you have a higher risk for hepatitis B, you should be screened for this virus. You are considered at high risk for hepatitis B if: ? You were  born in a country where hepatitis B is common. Ask your health care provider which countries are considered high risk. ? Your parents were born in a high-risk country, and you have not been immunized against hepatitis B (hepatitis B vaccine). ? You have HIV or AIDS. ? You use needles to inject street drugs. ? You live with someone who has hepatitis B. ? You have had sex with someone who has hepatitis B. ? You get hemodialysis treatment. ? You take certain medicines for conditions, including cancer, organ transplantation, and autoimmune conditions.  Hepatitis C  Blood testing is recommended for: ? Everyone born from [birth year] through 1963. ? Anyone with known risk factors for hepatitis C.  Sexually transmitted infections (STIs)  You should be screened for sexually transmitted infections (STIs) including gonorrhea and chlamydia if: ? You are sexually active and are younger than 69 years of age. ? You are older than 69 years of age and your health care provider tells you that you are at risk for this type of infection. ? Your sexual activity has changed since you were last screened and you are at an increased risk for chlamydia or gonorrhea. Ask your health care provider if you are at risk.  If you do not have HIV, but are at risk, it may be recommended that you take a prescription medicine daily to prevent HIV infection. This is called pre-exposure prophylaxis (PrEP). You are considered at risk if: ? You are sexually active and do not regularly use condoms or know the HIV status of your partner(s). ? You take drugs by injection. ? You are sexually active with a partner who has HIV.  Talk with your health care provider about whether you are at high risk of being infected with HIV. If you choose to begin PrEP,  you should first be tested for HIV. You should then be tested every 3 months for as long as you are taking PrEP. Pregnancy  If you are premenopausal and you may become pregnant, ask your health care provider about preconception counseling.  If you may become pregnant, take 400 to 800 micrograms (mcg) of folic acid every day.  If you want to prevent pregnancy, talk to your health care provider about birth control (contraception). Osteoporosis and menopause  Osteoporosis is a disease in which the bones lose minerals and strength with aging. This can result in serious bone fractures. Your risk for osteoporosis can be identified using a bone density scan.  If you are 48 years of age or older, or if you are at risk for osteoporosis and fractures, ask your health care provider if you should be screened.  Ask your health care provider whether you should take a calcium or vitamin D supplement to lower your risk for osteoporosis.  Menopause may have certain physical symptoms and risks.  Hormone replacement therapy may reduce some of these symptoms and risks. Talk to your health care provider about whether hormone replacement therapy is right for you. Follow these instructions at home:  Schedule regular health, dental, and eye exams.  Stay current with your immunizations.  Do not use any tobacco products including cigarettes, chewing tobacco, or electronic cigarettes.  If you are pregnant, do not drink alcohol.  If you are breastfeeding, limit how much and how often you drink alcohol.  Limit alcohol intake to no more than 1 drink per day for nonpregnant women. One drink equals 12 ounces of beer, 5 ounces of wine, or 1 ounces of hard liquor.  Do not use street drugs.  Do not share needles.  Ask your health care provider for help if you need support or information about quitting drugs.  Tell your health care provider if you often feel depressed.  Tell your health care provider if you  have ever been abused or do not feel safe at home. This information is not intended to replace advice given to you by your health care provider. Make sure you discuss any questions you have with your health care provider. Document Released: 12/24/2010 Document Revised: 11/16/2015 Document Reviewed: 03/14/2015 Elsevier Interactive Patient Education  2018 [place] in the Home Falls can cause injuries and can affect people from all age groups. There are many simple things that you can do to make your home safe and to help prevent falls. What can I do on the outside of my home?  Regularly repair the edges of walkways and driveways and fix any cracks.  Remove high doorway thresholds.  Trim any shrubbery on the main path into your home.  Use bright outdoor lighting.  Clear walkways of debris and clutter, including tools and rocks.  Regularly check that handrails are securely fastened and in good repair. Both sides of any steps should have handrails.  Install guardrails along the edges of any raised decks or porches.  Have leaves, snow, and ice cleared regularly.  Use sand or salt on walkways during winter months.  In the garage, clean up any spills right away, including grease or oil spills. What can I do in the bathroom?  Use night lights.  Install grab bars by the toilet and in the tub and shower. Do not use towel bars as grab bars.  Use non-skid mats or decals on the floor of the tub or shower.  If you need to sit down while you are in the shower, use a plastic, non-slip stool.  Keep the floor dry. Immediately clean up any water that spills on the floor.  Remove soap buildup in the tub or shower on a regular basis.  Attach bath mats securely with double-sided non-slip rug tape.  Remove throw rugs and other tripping hazards from the floor. What can I do in the bedroom?  Use night lights.  Make sure that a bedside light is easy to reach.  Do not  use oversized bedding that drapes onto the floor.  Have a firm chair that has side arms to use for getting dressed.  Remove throw rugs and other tripping hazards from the floor. What can I do in the kitchen?  Clean up any spills right away.  Avoid walking on wet floors.  Place frequently used items in easy-to-reach places.  If you need to reach for something above you, use a sturdy step stool that has a grab bar.  Keep electrical cables out of the way.  Do not use floor polish or wax that makes floors slippery. If you have to use wax, make sure that it is non-skid floor wax.  Remove throw rugs and other tripping hazards from the floor. What can I do in the stairways?  Do not leave any items on the stairs.  Make sure that there are handrails on both sides of the stairs. Fix handrails that are broken or loose. Make sure that handrails are as long as the stairways.  Check any carpeting to make sure that it is firmly attached to the stairs. Fix any carpet that is loose or worn.  Avoid having throw  rugs at the top or bottom of stairways, or secure the rugs with carpet tape to prevent them from moving.  Make sure that you have a light switch at the top of the stairs and the bottom of the stairs. If you do not have them, have them installed. What are some other fall prevention tips?  Wear closed-toe shoes that fit well and support your feet. Wear shoes that have rubber soles or low heels.  When you use a stepladder, make sure that it is completely opened and that the sides are firmly locked. Have someone hold the ladder while you are using it. Do not climb a closed stepladder.  Add color or contrast paint or tape to grab bars and handrails in your home. Place contrasting color strips on the first and last steps.  Use mobility aids as needed, such as canes, walkers, scooters, and crutches.  Turn on lights if it is dark. Replace any light bulbs that burn out.  Set up furniture so  that there are clear paths. Keep the furniture in the same spot.  Fix any uneven floor surfaces.  Choose a carpet design that does not hide the edge of steps of a stairway.  Be aware of any and all pets.  Review your medicines with your healthcare provider. Some medicines can cause dizziness or changes in blood pressure, which increase your risk of falling. Talk with your health care provider about other ways that you can decrease your risk of falls. This may include working with a physical therapist or trainer to improve your strength, balance, and endurance. This information is not intended to replace advice given to you by your health care provider. Make sure you discuss any questions you have with your health care provider. Document Released: 05/31/2002 Document Revised: 11/07/2015 Document Reviewed: 07/15/2014 Elsevier Interactive Patient Education  2017 [employer].

## 2017-03-11 NOTE — Assessment & Plan Note (Signed)
HTN- controlled on lisinopril 5mg 

## 2017-03-11 NOTE — Progress Notes (Signed)
Phone: [phone]  Subjective:  Patient presents today for their annual physical. Chief complaint-noted.   See problem oriented charting- ROS- full  review of systems was completed and negative except for: neuropathy pain in both feet  The following were reviewed and entered/updated in epic: Past Medical History:  Diagnosis Date  . Allergy   . Cataract    small  . Chicken pox 02/24/2014   Has had shingles vaccine  . Chronic diarrhea    For one year-needed prolonged course of antibiotics  . Diabetes mellitus without complication ([place])   . Diverticulosis 02/24/2014  . History of UTI    05/2013  . History of UTI 2014   per pt noted after taking Bactrim due to tooth infection  . Hyperlipidemia   . Neuromuscular disorder (HCC)    neuropathy both feet   . 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 Vaginal prolapse    Status post surgery. Improved urinary incontinence   Patient Active Problem List   Diagnosis Date Noted  . DM (diabetes mellitus) type II controlled, neurological manifestation ([place]) 02/24/2014    Priority: High  . Tobacco abuse 02/24/2014    Priority: High  . Osteopenia 03/30/2014    Priority: Medium  . Essential hypertension, benign 03/08/2014    Priority: Medium  . High cholesterol 02/24/2014    Priority: Medium  . Diabetic peripheral neuropathy ([place]) 10/27/2014    Priority: Low  . Seasonal allergies 02/24/2014    Priority: Low   Past Surgical History:  Procedure Laterality Date  . ABDOMINAL HYSTERECTOMY  09-07-2013  . ABDOMINAL SACROCOLPOPEXY  09-07-2013  . BLADDER SURGERY  09/07/2013   vaginal prolapse  . COLONOSCOPY  11-06-2005   sig tics, no polyps-[facility] [place]   . CYSTOSCOPY  09-07-2013  . ENTEROCELE REPAIR  09-07-2013  . INDUCED ABORTION  1985   Therapeutic  . LAPAROSCOPIC BILATERAL SALPINGO OOPHERECTOMY  09-07-2013  . PUBOVAGINAL SLING  09-07-2013  . TONSILLECTOMY Bilateral 1955  . TUBAL LIGATION      Family History    Problem Relation Age of Onset  . Cancer Father        Lung but was a smoker  . Stroke Father        50 massive lead to death  . Diabetes Father   . Bladder Cancer Father   . Diabetes Maternal Grandmother   . Osteoporosis Mother   . Colon cancer Neg Hx   . Colon polyps Neg Hx   . Esophageal cancer Neg Hx   . Rectal cancer Neg Hx   . Stomach cancer Neg Hx   . Anuerysm Mother     Medications- reviewed and updated Current Outpatient Prescriptions  Medication Sig Dispense Refill  . ACCU-CHEK FASTCLIX LANCETS MISC USE TO CHECK BLOOD SUGARS  3-4 TIMES DAILY. 408 each 11  . ACCU-CHEK SMARTVIEW test strip USE TO TEST BLOOD SUGARS  3-4 TIMES DAILY. 400 each 11  . aspirin 81 MG tablet Take 81 mg by mouth daily.    [name] atorvastatin (LIPITOR) 20 MG tablet Take 1 tablet (20 mg total) by mouth daily. 90 tablet 3  . Blood Glucose Monitoring Suppl (ACCU-CHEK NANO SMARTVIEW) w/Device KIT Use to check blood sugars 3-4 times daily 1 kit 0  . Calcium Carbonate-Vit D-Min (CALCIUM 1200 PO) Take 2 tablets by mouth.    . Cholecalciferol (VITAMIN D3) 2000 UNITS TABS Take 1 tablet by mouth.    . fexofenadine (ALLEGRA) 180 MG tablet  Take 180 mg by mouth daily.    [name] ibuprofen (ADVIL,MOTRIN) 400 MG tablet Take 400 mg by mouth at bedtime as needed. Reported on 09/04/2015    . lisinopril (PRINIVIL,ZESTRIL) 5 MG tablet Take 1 tablet (5 mg total) by mouth daily. 90 tablet 3  . Magnesium 500 MG CAPS Take 1 capsule by mouth.    . metFORMIN (GLUCOPHAGE) 1000 MG tablet TAKE 1 TABLET BY MOUTH TWO  TIMES DAILY WITH A MEAL 180 tablet 3  . Multiple Vitamins-Minerals (MULTIVITAMIN PO) Take 1 tablet by mouth.    . Omega-3 Fatty Acids (FISH OIL PO) Take 2 tablets by mouth 2 (two) times daily. 1,000 mg    . OVER THE COUNTER MEDICATION Herbal tea 8 ounces daily    . Probiotic Product (PROBIOTIC DAILY PO) Take 1 tablet by mouth.    . Wheat Dextrin (BENEFIBER PO) Take 1 tablet by mouth.     No current facility-administered  medications for this visit.     Allergies-reviewed and updated No Known Allergies  Social History   Social History  . Marital status: Married    Spouse name: N/A  . Number of children: N/A  . Years of education: N/A   Social History Main Topics  . Smoking status: Current Every Day Smoker    Packs/day: 1.00    Years: 53.00    Types: Cigarettes  . Smokeless tobacco: Never Used     Comment: Counseled that the single most powerful action she could take to decrease her risk of lung cancer is to quit smoking  . Alcohol use No  . Drug use: No  . Sexual activity: Not Asked   Other Topics Concern  . None   Social History Narrative   Married 45 years in 2015.  Moved to [place] to be near daughter who lives here. Has a son as well and 42 year old grandson. 3 granddogs. One dog at home       Retired former  Academic librarian. Has a BS in biology from [employer]. Used to be an algologist-studied algae under microscope      Hobbies-exercising with her husband          Objective: BP 108/60 (BP Location: Left Arm, Patient Position: Sitting, Cuff Size: Large)   Pulse 77   Temp 98.2 F (36.8 C) (Oral)   Ht 5' 9.25" (1.759 m)   Wt 196 lb 3.2 oz (89 kg)   SpO2 94%   BMI 28.76 kg/m  Gen: NAD, resting comfortably HEENT: Mucous membranes are moist. Oropharynx normal Neck: no thyromegaly CV: RRR no murmurs rubs or gallops Lungs: CTAB no crackles, wheeze, rhonchi Abdomen: soft/nontender/nondistended/normal bowel sounds. No rebound or guarding. obese Ext: no edema Skin: warm, dry Neuro: grossly normal, moves all extremities, PERRLA   Assessment/Plan:  71 y.o. female presenting for annual physical.  Health Maintenance counseling: 1. Anticipatory guidance: Patient counseled regarding regular dental exams q 4 months, eye exams yearly, wearing seatbelts.  2. Risk factor reduction:  Advised patient of need for regular exercise and diet rich and fruits and vegetables to reduce  risk of heart attack and stroke. Exercise- not exercising regularly- encouraged 150 minutes a week. Diet-discussed improving diet. Set goal 150 within a few years.  Wt Readings from Last 3 Encounters:  03/11/17 196 lb 3.2 oz (89 kg)  09/09/16 197 lb 9.6 oz (89.6 kg)  03/07/16 192 lb 6.4 oz (87.3 kg)  3. Immunizations/screenings/ancillary studies- had high dose flu shot today. Shingrix already given Immunization  History  Administered Date(s) Administered  . Hepatitis A 02/28/1948  . Hepatitis B 03[birth year]28[birth year]1949  . Influenza, High Dose Seasonal PF 03/17/2014, 03/07/2016, 03/11/2017  . Influenza,inj,Quad PF,6+ Mos 03/02/2015  . Influenza-Unspecified 03/10/2010, 02/25/2012, 03/17/2013  . MMR 05/14/1990, 05/14/1992  . Pneumococcal Conjugate-13 03/17/2013, 06/07/2013  . Pneumococcal Polysaccharide-23 09/19/2005, 02/24/2014  . Pneumococcal-Unspecified 09/19/2005  . Td 02/28/1950, 02/29/1956, 09/19/2005, 03/07/2016  . Tdap 02/29/1960  . Varicella 10/19/1991  . Zoster 02/28/2009  . Zoster Recombinat (Shingrix) 10/28/2016, 01/30/2017  4. Cervical cancer screening- passed age based screening. No history abnormal 5. Breast cancer screening-  breast exam today declined- does self exams- and mammogram up to date- scheduled for October 2018 had in oct 2017 6. Colon cancer screening - 12/21/15 with polyps- repeat 2020 7. Skin cancer screening- advised regular sunscreen use. Denies worrisome, changing, or new skin lesions.   Status of chronic or acute concerns   Referred to podiatry last visit for bunion. Didn't find visit that helpful. Diabetic shoe folks have been most helpful.   Tobacco abuse  Smoking cessation- advised complete cessation 1 ppd. Rash with patch. Didn't quit with chantix for 6 months. Discussed lozenges. Doing lung cancer screening. States is going to try to quit.   DM (diabetes mellitus) type II controlled, neurological manifestation Diabetes has been controlled on metformin 1g BID.  Update a1c. Slipped up to 7.2 on last visit- advised tightned diet. Also with diabetic neuropathy- no rx. Uses diabetic shoes to prevent worsening  Essential hypertension, benign HTN- controlled on lisinopril 69m  High cholesterol HLD- on lipitor 261m update lipids. Last LDL <70 at 51  Feet all the time hurt from her neuropathy  Has had some ankle swelling if on her feet. Wakes up in AM and have gone back down. Using diabetic compression stockings. Discussed low salt diet. Doubt heart failure at present.   6 month follow up unless a1c over 7 would see back in 14 weeks to 4 months  Orders Placed This Encounter  Procedures  . Flu vaccine HIGH DOSE PF  . LDL cholesterol, direct    [place]    Standing Status:   Future    Standing Expiration Date:   03/11/2018  . Hemoglobin A1c    [place]    Standing Status:   Future    Standing Expiration Date:   03/11/2018  . CBC    Standing Status:   Future    Standing Expiration Date:   03/11/2018  . Urinalysis    Standing Status:   Future    Standing Expiration Date:   03/11/2018  . Comprehensive metabolic panel    [place]    Standing Status:   Future    Standing Expiration Date:   03/11/2018    No orders of the defined types were placed in this encounter.   Return precautions advised.  StGarret ReddishMD

## 2017-03-11 NOTE — Assessment & Plan Note (Signed)
  Smoking cessation- advised complete cessation 1 ppd. Rash with patch. Didn't quit with chantix for 6 months. Discussed lozenges. Doing lung cancer screening. States is going to try to quit.

## 2017-03-11 NOTE — Assessment & Plan Note (Signed)
Diabetes has been controlled on metformin 1g BID. Update a1c. Slipped up to 7.2 on last visit- advised tightned diet. Also with diabetic neuropathy- no rx. Uses diabetic shoes to prevent worsening

## 2017-03-11 NOTE — Progress Notes (Addendum)
Subjective:   Samantha Snyder is a 71 y.o. female who presents for Medicare Annual (Subsequent) preventive examination.  The Patient was informed that the wellness visit is to identify future health risk and educate and initiate measures that can reduce risk for increased disease through the lifespan.    Annual Wellness Assessment  Reports health as   Preventive Screening -Counseling & Management  Medicare Annual Preventive Care Visit - Subsequent Last [place] as poor, fair, good or great? Would have extra money Hooked on the nicotine   Smoking;  Has neuropathy;  Worked successfully with diet with diabetes; Raised 2 children Vegetarians Heated with wood for awhile and grow gardens  VS reviewed;   Diet  Eats less portions  Always eats vegan   BMI 28. 9  Exercise Discussed walking 30 minutes x 5 days a week     Cardiac Risk Factors Addressed Hyperlipidemia - medically managed Diabetes; encouraged to walk 30 minutes 5 days a week for better control  Obesity states she eats well with her diabetes   Advanced Directives completed   Patient Care Team: Marin Olp, MD as PCP - General (Family Medicine)         Cardiac Risk Factors include: advanced age (>21mn, >>45women);diabetes mellitus;dyslipidemia;hypertension;smoking/ tobacco exposure     Objective:     Vitals: BP 108/60 (BP Location: Left Arm, Patient Position: Sitting, Cuff Size: Large)   Pulse 77   Temp 98.2 F (36.8 C) (Oral)   Ht 5' 9.25" (1.759 m)   Wt 196 lb 3.2 oz (89 kg)   SpO2 94%   BMI 28.76 kg/m   Body mass index is 28.76 kg/m.   Tobacco History  Smoking Status  . Current Every Day Smoker  . Packs/day: 1.00  . Years: 53.00  . Types: Cigarettes  Smokeless Tobacco  . Never Used    Comment: Counseled that the single most powerful action she could take to decrease her risk of lung cancer is to quit smoking     Ready to quit: Not Answered Counseling given: Not  Answered   Past Medical History:  Diagnosis Date  . Allergy   . Cataract    small  . Chicken pox 02/24/2014   Has had shingles vaccine  . Chronic diarrhea    For one year-needed prolonged course of antibiotics  . [place]   . Diverticulosis 02/24/2014  . History of UTI    05/2013  . History of UTI 2014   per pt noted after taking Bactrim due to tooth infection  . Hyperlipidemia   . Neuromuscular disorder (HCC)    neuropathy both feet   . Right adrenal mass ([place]    Biopsied in 2008 and benign  . Vaginal prolapse    Status post surgery. Improved urinary incontinence   Past Surgical History:  Procedure Laterality Date  . ABDOMINAL HYSTERECTOMY  09-07-2013  . ABDOMINAL SACROCOLPOPEXY  09-07-2013  . BLADDER SURGERY  09/07/2013   vaginal prolapse  . COLONOSCOPY  11-06-2005   [place], [place] [place]  . CYSTOSCOPY  09-07-2013  . ENTEROCELE REPAIR  09-07-2013  . INDUCED ABORTION  1985   Therapeutic  . LAPAROSCOPIC BILATERAL SALPINGO OOPHERECTOMY  09-07-2013  . PUBOVAGINAL SLING  09-07-2013  . TONSILLECTOMY Bilateral 1955  . TUBAL LIGATION     Family History  Problem Relation Age of Onset  . Cancer Father        Lung but  was a smoker  . Stroke Father        28 massive lead to death  . Diabetes Father   . Bladder Cancer Father   . Diabetes Maternal Grandmother   . Osteoporosis Mother   . Colon cancer Neg Hx   . Colon polyps Neg Hx   . Esophageal cancer Neg Hx   . Rectal cancer Neg Hx   . Stomach cancer Neg Hx   . Anuerysm Mother    History  Sexual Activity  . Sexual activity: Not on file    Outpatient Encounter Prescriptions as of 03/11/2017  Medication Sig  . ACCU-CHEK FASTCLIX LANCETS MISC USE TO CHECK BLOOD SUGARS  3-4 TIMES DAILY.  [name] ACCU-CHEK SMARTVIEW test strip USE TO TEST BLOOD SUGARS  3-4 TIMES DAILY.  [name] aspirin 81 MG tablet Take 81 mg by mouth daily.  [name] atorvastatin (LIPITOR) 20 MG tablet Take 1 tablet (20 mg  total) by mouth daily.  . Blood Glucose Monitoring Suppl (ACCU-CHEK NANO SMARTVIEW) w/Device KIT Use to check blood sugars 3-4 times daily  . Calcium Carbonate-Vit D-Min (CALCIUM 1200 PO) Take 2 tablets by mouth.  . Cholecalciferol (VITAMIN D3) 2000 UNITS TABS Take 1 tablet by mouth.  . fexofenadine (ALLEGRA) 180 MG tablet Take 180 mg by mouth daily.  [name] ibuprofen (ADVIL,MOTRIN) 400 MG tablet Take 400 mg by mouth at bedtime as needed. Reported on 09/04/2015  . lisinopril (PRINIVIL,ZESTRIL) 5 MG tablet Take 1 tablet (5 mg total) by mouth daily.  . Magnesium 500 MG CAPS Take 1 capsule by mouth.  . metFORMIN (GLUCOPHAGE) 1000 MG tablet TAKE 1 TABLET BY MOUTH TWO  TIMES DAILY WITH A MEAL  . Multiple Vitamins-Minerals (MULTIVITAMIN PO) Take 1 tablet by mouth.  . Omega-3 Fatty Acids (FISH OIL PO) Take 2 tablets by mouth 2 (two) times daily. 1,000 mg  . OVER THE COUNTER MEDICATION Herbal tea 8 ounces daily  . Probiotic Product (PROBIOTIC DAILY PO) Take 1 tablet by mouth.  . Wheat Dextrin (BENEFIBER PO) Take 1 tablet by mouth.   No facility-administered encounter medications on file as of 03/11/2017.     Activities of Daily Living In your present state of health, do you have any difficulty performing the following activities: 03/11/2017  Hearing? N  Vision? N  Difficulty concentrating or making decisions? N  Walking or climbing stairs? N  Comment no stairs in home   Dressing or bathing? N  Doing errands, shopping? N  Preparing Food and eating ? N  Using the Toilet? N  In the past six months, have you accidently leaked urine? N  Do you have problems with loss of bowel control? N  Managing your Medications? N  Managing your Finances? N  Housekeeping or managing your Housekeeping? N  Some recent data might be hidden    Patient Care Team: Marin Olp, MD as PCP - General (Family Medicine)    Assessment:     Exercise Activities and Dietary recommendations Current Exercise Habits:  Home exercise routine, Type of exercise: walking (encouarged her to start walking 5 days a week )  Goals    . Quit smoking / using tobacco          Smoking;  Educated to avoid secondary smoke  Classes offered a couple of times a month;  Will work with the patient as far as registration and location  Meds may help; chatix (Varenicline); Zyban (Bupropion SR); Nicotine Replacement (gum; lozenges; patches; etc.)   30 pack yr  smoking hx: Educated regarding LDCT; To discuss with MD at next fup. Also educated on AAA screening for men 65-75 who have smoked  Try to start thinking of your without cigarettes  I can quit         Fall Risk Fall Risk  03/11/2017 09/09/2016 09/04/2015 06/28/2014  Falls in the past year? Yes No No No  Number falls in past yr: 1 - - -  Injury with Fall? No - - -  Follow up Education provided - - -  Comment she was no a boat, fishing  - - -   Depression Screen PHQ 2/9 Scores 03/11/2017 09/09/2016 09/04/2015 06/28/2014  PHQ - 2 Score 0 0 0 0     Cognitive Function Ad8 score 0         Immunization History  Administered Date(s) Administered  . Hepatitis A 02/28/1948  . Hepatitis B [birth year]28[birth year]1949  . Influenza, High Dose Seasonal PF 03/17/2014, 03/07/2016, 03/11/2017  . Influenza,inj,Quad PF,6+ Mos 03/02/2015  . Influenza-Unspecified 03/10/2010, 02/25/2012, 03/17/2013  . MMR 05/14/1990, 05/14/1992  . Pneumococcal Conjugate-13 03/17/2013, 06/07/2013  . Pneumococcal Polysaccharide-23 09/19/2005, 02/24/2014  . Pneumococcal-Unspecified 09/19/2005  . Td 02/28/1950, 02/29/1956, 09/19/2005, 03/07/2016  . Tdap 02/29/1960  . Varicella 10/19/1991  . Zoster 02/28/2009  . Zoster Recombinat (Shingrix) 10/28/2016, 01/30/2017   Screening Tests Health Maintenance  Topic Date Due  . HEMOGLOBIN A1C  03/12/2017  . OPHTHALMOLOGY EXAM  04/18/2017  . FOOT EXAM  09/09/2017  . MAMMOGRAM  04/05/2018  . COLONOSCOPY  12/21/2018  . TETANUS/TDAP  03/07/2026  . INFLUENZA VACCINE   Completed  . DEXA SCAN  Completed  . Hepatitis C Screening  Completed  . PNA vac Low Risk Adult  Completed      Plan:     PCP Notes   Health Maintenance Had flu vaccine today Coached to quit smoking Resources given Strengths identified  All other health maintenance up to  Date Hearing Screening Comments: No hearing issues  Vision Screening Comments: Vision checks in October    Abnormal Screens  Encouraged to walk x 5 days a week to assist with A1c lowering, which has been helpful in the past  Referrals  none  Patient concerns; none  Nurse Concerns; non  Next PCP apt Was today       I have personally reviewed and noted the following in the patient's chart:   . Medical and social history . Use of alcohol, tobacco or illicit drugs  . Current medications and supplements . Functional ability and status . Nutritional status . Physical activity . Advanced directives . List of other physicians . Hospitalizations, surgeries, and ER visits in previous 12 months . Vitals . Screenings to include cognitive, depression, and falls . Referrals and appointments  In addition, I have reviewed and discussed with patient certain preventive protocols, quality metrics, and best practice recommendations. A written personalized care plan for preventive services as well as general preventive health recommendations were provided to patient.     IONGE,XBMWU, RN  03/11/2017   I have reviewed and agree with note, evaluation, plan.   Garret Reddish, MD

## 2017-03-11 NOTE — Addendum Note (Signed)
Addended by: Tomi Likens on: 03/11/2017 09:56 AM   Modules accepted: Orders

## 2017-03-12 ENCOUNTER — Other Ambulatory Visit: Payer: Self-pay

## 2017-03-12 DIAGNOSIS — R309 Painful micturition, unspecified: Secondary | ICD-10-CM

## 2017-03-13 ENCOUNTER — Encounter: Payer: Self-pay | Admitting: Family Medicine

## 2017-03-13 NOTE — Addendum Note (Signed)
Addended by: Tomi Likens on: 03/13/2017 01:13 PM   Modules accepted: Orders

## 2017-03-16 ENCOUNTER — Encounter: Payer: Self-pay | Admitting: Family Medicine

## 2017-03-16 LAB — URINE CULTURE
MICRO NUMBER: 81041595
SPECIMEN QUALITY: ADEQUATE

## 2017-03-17 ENCOUNTER — Other Ambulatory Visit: Payer: Self-pay | Admitting: Family Medicine

## 2017-03-17 ENCOUNTER — Telehealth: Payer: Self-pay

## 2017-03-17 MED ORDER — CEPHALEXIN 500 MG PO CAPS: 500.0000 mg | ORAL_CAPSULE | Freq: Three times a day (TID) | ORAL | 0 refills | Status: DC

## 2017-03-17 NOTE — Telephone Encounter (Signed)
error 

## 2017-04-07 ENCOUNTER — Other Ambulatory Visit: Payer: Self-pay | Admitting: Family Medicine

## 2017-04-07 ENCOUNTER — Ambulatory Visit
Admission: RE | Admit: 2017-04-07 | Discharge: 2017-04-07 | Disposition: A | Discharge status: Home / Self Care | Payer: Medicare Other | Source: Ambulatory Visit | Attending: Family Medicine | Admitting: Family Medicine

## 2017-04-07 DIAGNOSIS — Z1231 Encounter for screening mammogram for malignant neoplasm of breast: Secondary | ICD-10-CM

## 2017-04-22 LAB — HM DIABETES EYE EXAM

## 2017-04-24 ENCOUNTER — Encounter: Payer: Self-pay | Admitting: Family Medicine

## 2017-07-07 NOTE — Progress Notes (Signed)
Subjective:  Samantha Snyder is a 72 y.o. year old very pleasant female patient who presents for/with See problem oriented charting ROS- intermittent cough. No chest pain or shortness of breath. No headache or blurry vision. No low blood sugars.    Past Medical History-  Patient Active Problem List   Diagnosis Date Noted  . DM (diabetes mellitus) type II controlled, neurological manifestation ([place]) 02/24/2014    Priority: High  . Tobacco abuse 02/24/2014    Priority: High  . Osteopenia 03/30/2014    Priority: Medium  . Essential hypertension, benign 03/08/2014    Priority: Medium  . High cholesterol 02/24/2014    Priority: Medium  . Diabetic peripheral neuropathy ([place]) 10/27/2014    Priority: Low  . Seasonal allergies 02/24/2014    Priority: Low  . Aortic atherosclerosis ([place]) 07/08/2017    Medications- reviewed and updated Current Outpatient Medications  Medication Sig Dispense Refill  . ACCU-CHEK FASTCLIX LANCETS MISC USE TO CHECK BLOOD SUGARS  3-4 TIMES DAILY. 408 each 11  . ACCU-CHEK SMARTVIEW test strip USE TO TEST BLOOD SUGARS  3-4 TIMES DAILY. 400 each 11  . aspirin 81 MG tablet Take 81 mg by mouth daily.    [name] atorvastatin (LIPITOR) 20 MG tablet TAKE 1 TABLET BY MOUTH  DAILY 90 tablet 2  . Blood Glucose Monitoring Suppl (ACCU-CHEK NANO SMARTVIEW) w/Device KIT Use to check blood sugars 3-4 times daily 1 kit 0  . Calcium Carbonate-Vit D-Min (CALCIUM 1200 PO) Take 2 tablets by mouth.    . Cholecalciferol (VITAMIN D3) 2000 UNITS TABS Take 1 tablet by mouth.    . fexofenadine (ALLEGRA) 180 MG tablet Take 180 mg by mouth daily.    [name] ibuprofen (ADVIL,MOTRIN) 400 MG tablet Take 400 mg by mouth at bedtime as needed. Reported on 09/04/2015    . lisinopril (PRINIVIL,ZESTRIL) 5 MG tablet TAKE 1 TABLET BY MOUTH  DAILY 90 tablet 2  . Magnesium 500 MG CAPS Take 1 capsule by mouth.    . metFORMIN (GLUCOPHAGE) 1000 MG tablet TAKE 1 TABLET BY MOUTH TWO  TIMES DAILY WITH A MEAL 180 tablet 3  .  Multiple Vitamins-Minerals (MULTIVITAMIN PO) Take 1 tablet by mouth.    . Omega-3 Fatty Acids (FISH OIL PO) Take 2 tablets by mouth 2 (two) times daily. 1,000 mg    . OVER THE COUNTER MEDICATION Herbal tea 8 ounces daily    . Probiotic Product (PROBIOTIC DAILY PO) Take 1 tablet by mouth.    . Wheat Dextrin (BENEFIBER PO) Take 1 tablet by mouth.     No current facility-administered medications for this visit.     Objective: BP 104/78 (BP Location: Left Arm, Patient Position: Sitting, Cuff Size: Large)   Pulse 87   Temp 98.6 F (37 C) (Oral)   Ht 5' 9.25" (1.759 m)   Wt 191 lb (86.6 kg)   SpO2 91%   BMI 28.00 kg/m  Gen: NAD, resting comfortably, smells of smoke CV: RRR no murmurs rubs or gallops Lungs: CTAB no crackles, wheeze, rhonchi Abdomen: soft/nontender/nondistended/normal bowel sounds.obese  Ext: no edema Skin: warm, dry Neuro: see below  Diabetic Foot Exam - Simple   Simple Foot Form Visual Inspection No deformities, no ulcerations, no other skin breakdown bilaterally:  Yes Sensation Testing See comments:  Yes Pulse Check Posterior Tibialis and Dorsalis pulse intact bilaterally:  Yes Comments Callous great toe MTP and IP joint bilaterally. Bunion both feet. Hammer toes both feet. Also small callous at MTP 5th toe. Some decreased  sensation great toe on R and L but can still feel with monofilament.  Unchanged from prior exam.     Assessment/Plan:  Other issues 1. Swelling in feet by end of day- still going back down each AM- had discussed low salt diet. Doubt heart failure. Has used diabetic socks for edema and that has been helpful  Hypertension S: controlled on  lisinopril 12m.  BP Readings from Last 3 Encounters:  07/08/17 104/78  03/11/17 108/60  09/09/16 132/76  A/P: We discussed blood pressure goal of <140/90. Continue current meds  Hyperlipidemia S:  controlled on lipitor 256mwith LDL 73 which is reasonable. No myalgias.   A/P: continue current  rx  DM (diabetes mellitus) type II controlled, neurological manifestation  DM neuropathy S: mild poorly controlled. On metformin 1g BID- was to tigthen diet further. Diabetic neuropathy but no rx- uses diabetic shoes to prevent ulceration  She cut out chips as main change and that seems to help- PB and cellery. Eating healthier sandwich. Has to watch her popcorn. Weight down 5 lbs Lab Results  Component Value Date   HGBA1C 6.7 07/08/2017   HGBA1C 7.1 (H) 03/11/2017   HGBA1C 7.2 (H) 09/09/2016   A/P: much improved- continue current medicine. Fill out forms for diabetic neuropathy and footwear  [place] aortic atherosclerosis on CT 09/12/16. CAD noted left circumflex.  A/P: already controlling BP and lipids- discussed need to quit smoking  Tobacco abuse S: smoking 1 PPD. Rash with patch. chantix 6 months didn't lead to cessation. Already on board lung cancer screening A/P: encouraged complete cessation  Return in about 4 months (around 11/05/2017).  Then CPE and AWV 03/11/17.   Lab/Order associations: Controlled type 2 diabetes mellitus with diabetic polyneuropathy, without long-term current use of insulin ([place]- Plan: POCT glycosylated hemoglobin (Hb A1C)  Return precautions advised.  StGarret ReddishMD

## 2017-07-08 ENCOUNTER — Ambulatory Visit: Payer: Medicare Other | Admitting: Family Medicine

## 2017-07-08 ENCOUNTER — Encounter: Payer: Self-pay | Admitting: Family Medicine

## 2017-07-08 VITALS — BP 104/78 | HR 87 | Temp 98.6°F | Ht 69.25 in | Wt 191.0 lb

## 2017-07-08 DIAGNOSIS — E7800 Pure hypercholesterolemia, unspecified: Secondary | ICD-10-CM

## 2017-07-08 DIAGNOSIS — I700 Atherosclerosis of aorta: Secondary | ICD-10-CM | POA: Diagnosis not present

## 2017-07-08 DIAGNOSIS — Z720 Tobacco use: Secondary | ICD-10-CM | POA: Diagnosis not present

## 2017-07-08 DIAGNOSIS — I10 Essential (primary) hypertension: Secondary | ICD-10-CM | POA: Diagnosis not present

## 2017-07-08 DIAGNOSIS — E1142 Type 2 diabetes mellitus with diabetic polyneuropathy: Secondary | ICD-10-CM | POA: Diagnosis not present

## 2017-07-08 LAB — POCT GLYCOSYLATED HEMOGLOBIN (HGB A1C): HEMOGLOBIN A1C: 6.7

## 2017-07-08 NOTE — Assessment & Plan Note (Signed)
DM neuropathy S: mild poorly controlled. On metformin 1g BID- was to tigthen diet further. Diabetic neuropathy but no rx- uses diabetic shoes to prevent ulceration  She cut out chips as main change and that seems to help- PB and cellery. Eating healthier sandwich. Has to watch her popcorn. Weight down 5 lbs Lab Results  Component Value Date   HGBA1C 6.7 07/08/2017   HGBA1C 7.1 (H) 03/11/2017   HGBA1C 7.2 (H) 09/09/2016   A/P: much improved- continue current medicine. Fill out forms for diabetic neuropathy and footwear

## 2017-07-08 NOTE — Patient Instructions (Signed)
Lab Results  Component Value Date   HGBA1C 6.7 07/08/2017  looks great. No changes keep up the good work

## 2017-07-08 NOTE — Assessment & Plan Note (Signed)
S: aortic atherosclerosis on CT 09/12/16. CAD noted left circumflex.  A/P: already controlling BP and lipids- discussed need to quit smoking

## 2017-07-08 NOTE — Assessment & Plan Note (Signed)
S: smoking 1 PPD. Rash with patch. chantix 6 months didn't lead to cessation. Already on board lung cancer screening A/P: encouraged complete cessation

## 2017-07-14 ENCOUNTER — Encounter: Payer: Self-pay | Admitting: Family Medicine

## 2017-07-22 ENCOUNTER — Encounter: Payer: Self-pay | Admitting: Family Medicine

## 2017-07-22 NOTE — Telephone Encounter (Signed)
Please advise.   Copied from [place]. Topic: General - Other >> Samantha Snyder 29, 2019  3:30 PM Patrice Paradise wrote: Reason for CRM: Patient is requesting a call back from [facility], Dr. Yong Channel assistant. The call is in reference to to the forms that was faxed over for her diabetic shoes, she can be reached @ [phone].

## 2017-07-25 ENCOUNTER — Encounter: Payer: Self-pay | Admitting: Family Medicine

## 2017-07-25 NOTE — Telephone Encounter (Unsigned)
Copied from [place]. Topic: General - Other >> Mekaela 29, 2019  3:30 PM Patrice Paradise wrote: Reason for CRM: Patient is requesting a call back from [facility], Dr. Yong Channel assistant. The call is in reference to to the forms that was faxed over for her diabetic shoes, she can be reached @ [phone].  >> Jul 25, 2017  8:56 AM Neva Seat wrote: [facility] [phone]   They are asking do they need to refax the form because they haven't received the faxed form for pt to receive her Orth shoes.  Please call Clarene Critchley back to have this taken care of asap.

## 2017-07-28 ENCOUNTER — Encounter: Payer: Self-pay | Admitting: Family Medicine

## 2017-08-29 ENCOUNTER — Other Ambulatory Visit: Payer: Self-pay | Admitting: Family Medicine

## 2017-09-15 ENCOUNTER — Ambulatory Visit (INDEPENDENT_AMBULATORY_CARE_PROVIDER_SITE_OTHER)
Admission: RE | Admit: 2017-09-15 | Discharge: 2017-09-15 | Disposition: A | Discharge status: Home / Self Care | Payer: Medicare Other | Source: Ambulatory Visit | Attending: Acute Care | Admitting: Acute Care

## 2017-09-15 DIAGNOSIS — F17210 Nicotine dependence, cigarettes, uncomplicated: Secondary | ICD-10-CM | POA: Diagnosis not present

## 2017-09-17 ENCOUNTER — Other Ambulatory Visit: Payer: Self-pay | Admitting: Acute Care

## 2017-09-17 DIAGNOSIS — Z122 Encounter for screening for malignant neoplasm of respiratory organs: Secondary | ICD-10-CM

## 2017-09-17 DIAGNOSIS — F17210 Nicotine dependence, cigarettes, uncomplicated: Principal | ICD-10-CM

## 2017-11-06 ENCOUNTER — Encounter: Payer: Self-pay | Admitting: Family Medicine

## 2017-11-06 ENCOUNTER — Ambulatory Visit: Payer: Medicare Other | Admitting: Family Medicine

## 2017-11-06 VITALS — BP 118/78 | HR 68 | Temp 98.4°F | Ht 69.25 in | Wt 191.2 lb

## 2017-11-06 DIAGNOSIS — E1142 Type 2 diabetes mellitus with diabetic polyneuropathy: Secondary | ICD-10-CM | POA: Diagnosis not present

## 2017-11-06 DIAGNOSIS — I10 Essential (primary) hypertension: Secondary | ICD-10-CM | POA: Diagnosis not present

## 2017-11-06 DIAGNOSIS — E7800 Pure hypercholesterolemia, unspecified: Secondary | ICD-10-CM | POA: Diagnosis not present

## 2017-11-06 DIAGNOSIS — Z720 Tobacco use: Secondary | ICD-10-CM | POA: Diagnosis not present

## 2017-11-06 LAB — BASIC METABOLIC PANEL
BUN: 23 mg/dL (ref 6–23)
CALCIUM: 9.4 mg/dL (ref 8.4–10.5)
CO2: 27 mEq/L (ref 19–32)
CREATININE: 0.57 mg/dL (ref 0.40–1.20)
Chloride: 105 mEq/L (ref 96–112)
GFR: 111.37 mL/min (ref >60.00)
Glucose, Bld: 138 mg/dL — ABNORMAL HIGH (ref 70–99)
Potassium: 4 mEq/L (ref 3.5–5.1)
Sodium: 140 mEq/L (ref 135–145)

## 2017-11-06 LAB — HEMOGLOBIN A1C: HEMOGLOBIN A1C: 7 % — AB (ref 4.6–6.5)

## 2017-11-06 NOTE — Patient Instructions (Addendum)
Health Maintenance Due  Topic Date Due  . FOOT EXAM - Today at office Visit 09/09/2017   Please stop by lab before you go  See you back in September!   Also see if they happen to have any wellness visit spots on that day. If not no big deal

## 2017-11-06 NOTE — Assessment & Plan Note (Signed)
S: controlled on lisinopril 5 mg BP Readings from Last 3 Encounters:  11/06/17 118/78  07/08/17 104/78  03/11/17 108/60  A/P: We discussed blood pressure goal of <140/90. Continue current meds

## 2017-11-06 NOTE — Assessment & Plan Note (Signed)
S:  controlled on metformin 1 g twice daily.  She continues to suffer with diabetic neuropathy- uses diabetic shoes to prevent ulceration. Exercise and diet- she has maintained her weight loss from last visit CBGs-  Am from 125 to 145, lunchtime sometimes down to 90. No lows Lab Results  Component Value Date   HGBA1C 6.7 07/08/2017   HGBA1C 7.1 (H) 03/11/2017   HGBA1C 7.2 (H) 09/09/2016   A/P: update a1c

## 2017-11-06 NOTE — Progress Notes (Signed)
Your A1c is slipping up some now up to 7.  Need to continue efforts to buckle down on healthy eating and regular exercise. Your BMET was normal (kidney, electrolytes, blood sugar) for someone with diabetes

## 2017-11-06 NOTE — Assessment & Plan Note (Addendum)
S:  controlled on Lipitor 20 mg with last LDL of 73. Trying to eat healthy  A/P: Need to update lipids

## 2017-11-06 NOTE — Assessment & Plan Note (Signed)
S: Smoking 1 pack/day.  She did not tolerate patch due to rash, Chantix for 6 months did not help.  She is in rolled in lung cancer screening program A/P: Not ready to quit-encouraged complete cessation. Gave her a handout for hypnotism with [employer]

## 2017-11-06 NOTE — Progress Notes (Signed)
Subjective:  Samantha Snyder is a 72 y.o. year old very pleasant female patient who presents for/with See problem oriented charting ROS- No chest pain or shortness of breath. No headache or blurry vision. No hypoglycemia   Past Medical History-  Patient Active Problem List   Diagnosis Date Noted  . DM (diabetes mellitus) type II controlled, neurological manifestation ([place]) 02/24/2014    Priority: High  . Tobacco abuse 02/24/2014    Priority: High  . Osteopenia 03/30/2014    Priority: Medium  . Essential hypertension, benign 03/08/2014    Priority: Medium  . High cholesterol 02/24/2014    Priority: Medium  . Diabetic peripheral neuropathy ([place]) 10/27/2014    Priority: Low  . Seasonal allergies 02/24/2014    Priority: Low  . Aortic atherosclerosis ([place]) 07/08/2017    Medications- reviewed and updated Current Outpatient Medications  Medication Sig Dispense Refill  . ACCU-CHEK FASTCLIX LANCETS MISC USE TO CHECK BLOOD SUGARS  3-4 TIMES DAILY. 408 each 11  . ACCU-CHEK SMARTVIEW test strip USE TO TEST BLOOD SUGARS  3-4 TIMES DAILY. 400 each 11  . aspirin 81 MG tablet Take 81 mg by mouth daily.    [name] atorvastatin (LIPITOR) 20 MG tablet TAKE 1 TABLET BY MOUTH  DAILY 90 tablet 2  . Blood Glucose Monitoring Suppl (ACCU-CHEK NANO SMARTVIEW) w/Device KIT Use to check blood sugars 3-4 times daily 1 kit 0  . Calcium Carbonate-Vit D-Min (CALCIUM 1200 PO) Take 2 tablets by mouth.    . Cholecalciferol (VITAMIN D3) 2000 UNITS TABS Take 1 tablet by mouth.    . fexofenadine (ALLEGRA) 180 MG tablet Take 180 mg by mouth daily.    [name] ibuprofen (ADVIL,MOTRIN) 400 MG tablet Take 400 mg by mouth at bedtime as needed. Reported on 09/04/2015    . lisinopril (PRINIVIL,ZESTRIL) 5 MG tablet TAKE 1 TABLET BY MOUTH  DAILY 90 tablet 2  . Magnesium 500 MG CAPS Take 1 capsule by mouth.    . metFORMIN (GLUCOPHAGE) 1000 MG tablet TAKE 1 TABLET BY MOUTH TWO  TIMES DAILY WITH A MEAL 180 tablet 3  . Multiple Vitamins-Minerals  (MULTIVITAMIN PO) Take 1 tablet by mouth.    . Omega-3 Fatty Acids (FISH OIL PO) Take 2 tablets by mouth 2 (two) times daily. 1,000 mg    . OVER THE COUNTER MEDICATION Herbal tea 8 ounces daily    . Probiotic Product (PROBIOTIC DAILY PO) Take 1 tablet by mouth.    . Wheat Dextrin (BENEFIBER PO) Take 1 tablet by mouth.     No current facility-administered medications for this visit.     Objective: BP 118/78 (BP Location: Left Arm, Patient Position: Sitting, Cuff Size: Normal)   Pulse 68   Temp 98.4 F (36.9 C) (Oral)   Ht 5' 9.25" (1.759 m)   Wt 191 lb 3.2 oz (86.7 kg)   SpO2 95%   BMI 28.03 kg/m  Gen: NAD, resting comfortably CV: RRR no murmurs rubs or gallops Lungs: CTAB no crackles, wheeze, rhonchi Abdomen: soft/nontender/nondistended/normal bowel sounds. overweight Ext: no edema Skin: warm, dry Neuro: grossly normal, moves all extremities  Diabetic Foot Exam - Simple   Simple Foot Form Diabetic Foot exam was performed with the following findings:  Yes 11/06/2017  9:11 AM  Visual Inspection See comments:  Yes Sensation Testing See comments:  Yes Pulse Check Posterior Tibialis and Dorsalis pulse intact bilaterally:  Yes Comments Reports decreased sensation compared to checking on shin Hammer toe both feet, bunion both feet- callous at bunion.  Also at IP joint on great toes has callous. No obvious ulceration.       Assessment/Plan:  Other notes: 1.into gardening season- she is enjoying this   Tobacco abuse S: Smoking 1 pack/day.  She did not tolerate patch due to rash, Chantix for 6 months did not help.  She is in rolled in lung cancer screening program A/P: Not ready to quit-encouraged complete cessation. Gave her a handout for hypnotism with [place] hypertension, benign S: controlled on lisinopril 5 mg BP Readings from Last 3 Encounters:  11/06/17 118/78  07/08/17 104/78  03/11/17 108/60  A/P: We discussed blood pressure goal of <140/90.  Continue current meds  High cholesterol S:  controlled on Lipitor 20 mg with last LDL of 73. Trying to eat healthy  A/P: Need to update lipids  DM (diabetes mellitus) type II controlled, neurological manifestation S:  controlled on metformin 1 g twice daily.  She continues to suffer with diabetic neuropathy- uses diabetic shoes to prevent ulceration. Exercise and diet- she has maintained her weight loss from last visit CBGs-  Am from 125 to 145, lunchtime sometimes down to 90. No lows Lab Results  Component Value Date   HGBA1C 6.7 07/08/2017   HGBA1C 7.1 (H) 03/11/2017   HGBA1C 7.2 (H) 09/09/2016   A/P: update a1c  Future Appointments  Date Time Provider [place]  03/12/2018  8:15 AM Marin Olp, MD [facility]   Lab/Order associations: Controlled type 2 diabetes mellitus with diabetic polyneuropathy, without long-term current use of insulin ([place]) - Plan: Basic metabolic panel, Hemoglobin A1c  Return precautions advised.  Garret Reddish, MD

## 2018-02-24 ENCOUNTER — Other Ambulatory Visit: Payer: Self-pay | Admitting: Family Medicine

## 2018-02-24 DIAGNOSIS — Z1231 Encounter for screening mammogram for malignant neoplasm of breast: Secondary | ICD-10-CM

## 2018-03-11 NOTE — Progress Notes (Signed)
Subjective:   Samantha Snyder is a 72 y.o. female who presents for Medicare Annual (Subsequent) preventive examination.  Reports health as good  Smoking;  Has neuropathy;  Worked successfully with diet with diabetes; Raised 2 children Vegetarians Heated with wood for awhile and grow gardens  Diet  Eats less portions  Breakfast; varies oatmeal and prunes Fried egg on occasion  She eats instant oatmeal with fiber toast w grated cheese and 2 boiled eggs Lunch sandwich / wheat bread; lean [place] Supper home grown romaine A lot of chicken, fresh caught fish;  Canned vegetables from garden Stopped eating chips  Chol /hdl 3 A1c 7.0;    BMI 28. 9  Exercise Discussed walking 30 minutes x 5 days a week    Health Maintenance Due  Topic Date Due  . INFLUENZA VACCINE  01/22/2018   Cardiac Risk Factors include: advanced age (>27mn, >>15women);diabetes mellitus;dyslipidemia;family history of premature cardiovascular disease;hypertensionshe has had her shingrix   Took flu vaccine in clinic  Current everyday smoker w 53 pack years  Followed by SSherrell Pullerfor smoking cessation; Remembered a couple of times she was successful and this is possible as well   Colonoscopy 11/2015  Mammogram 03/2017 - it is scheduled this year  Will repeat bone density- states Dr. HYong Channelhas ordered  dexa 03/2016 -1.4 in the spine Taking calcium and Vit D       Objective:     Vitals: BP 128/72   Pulse 76   Ht 5' 9"  (1.753 m)   Wt 196 lb (88.9 kg)   BMI 28.94 kg/m   Body mass index is 28.94 kg/m.  Advanced Directives 03/12/2018 03/11/2017 12/21/2015 12/07/2015 11/01/2015  Does Patient Have a Medical Advance Directive? Yes Yes Yes Yes Yes  Type of Advance Directive - - -Public librarianLiving will HBrightonLiving will  Copy of H[place]marin Chart? - - No - copy requested - -    Tobacco Social History   Tobacco Use  Smoking  Status Current Every Day Smoker  . Packs/day: 1.00  . Years: 53.00  . Pack years: 53.00  . Types: Cigarettes  Smokeless Tobacco Never Used  Tobacco Comment   Counseled that the single most powerful action she could take to decrease her risk of lung cancer is to quit smoking     Ready to quit: Yes Counseling given: Yes Comment: Counseled that the single most powerful action she could take to decrease her risk of lung cancer is to quit smoking   Clinical Intake:     Past Medical History:  Diagnosis Date  . Allergy   . Cataract    small  . Chicken pox 02/24/2014   Has had shingles vaccine  . Chronic diarrhea    For one year-needed prolonged course of antibiotics  . Diabetes mellitus without complication ([place]   . Diverticulosis 02/24/2014  . History of UTI    05/2013  . History of UTI 2014   per pt noted after taking Bactrim due to tooth infection  . Hyperlipidemia   . Neuromuscular disorder (HCC)    neuropathy both feet   . Right adrenal mass ([facility]    Biopsied in 2008 and benign  . Vaginal prolapse    Status post surgery. Improved urinary incontinence   Past Surgical History:  Procedure Laterality Date  . ABDOMINAL HYSTERECTOMY  09-07-2013  . ABDOMINAL SACROCOLPOPEXY  09-07-2013  . BLADDER SURGERY  09/07/2013   vaginal  prolapse  . COLONOSCOPY  11-06-2005   sig tics, no polyps-[facility] [place]   . CYSTOSCOPY  09-07-2013  . ENTEROCELE REPAIR  09-07-2013  . INDUCED ABORTION  1985   Therapeutic  . LAPAROSCOPIC BILATERAL SALPINGO OOPHERECTOMY  09-07-2013  . PUBOVAGINAL SLING  09-07-2013  . TONSILLECTOMY Bilateral 1955  . TUBAL LIGATION     Family History  Problem Relation Age of Onset  . Cancer Father        Lung but was a smoker  . Stroke Father        29 massive lead to death  . Diabetes Father   . Bladder Cancer Father   . Osteoporosis Mother   . Anuerysm Mother   . Diabetes Maternal Grandmother   . Colon cancer Neg Hx   . Colon polyps Neg Hx   . Esophageal  cancer Neg Hx   . Rectal cancer Neg Hx   . Stomach cancer Neg Hx   . Breast cancer Neg Hx    Social History   Socioeconomic History  . Marital status: Married    Spouse name: Not on file  . Number of children: Not on file  . Years of education: Not on file  . Highest education level: Not on file  Occupational History  . Not on file  Social Needs  . Financial resource strain: Not on file  . Food insecurity:    Worry: Not on file    Inability: Not on file  . Transportation needs:    Medical: Not on file    Non-medical: Not on file  Tobacco Use  . Smoking status: Current Every Day Smoker    Packs/day: 1.00    Years: 53.00    Pack years: 53.00    Types: Cigarettes  . Smokeless tobacco: Never Used  . Tobacco comment: Counseled that the single most powerful action she could take to decrease her risk of lung cancer is to quit smoking  Substance and Sexual Activity  . Alcohol use: No    Alcohol/week: 0.0 standard drinks  . Drug use: No  . Sexual activity: Not on file  Lifestyle  . Physical activity:    Days per week: Not on file    Minutes per session: Not on file  . Stress: Not on file  Relationships  . Social connections:    Talks on phone: Not on file    Gets together: Not on file    Attends religious service: Not on file    Active member of club or organization: Not on file    Attends meetings of clubs or organizations: Not on file    Relationship status: Not on file  Other Topics Concern  . Not on file  Social History Narrative   Married 45 years in 2015.  Moved to [place] to be near daughter who lives here. Has a son as well and 40 year old grandson. 3 granddogs. One dog at home       Retired former  Academic librarian. Has a BS in biology from [employer]. Used to be an algologist-studied algae under microscope      Hobbies-exercising with her husband       Outpatient Encounter Medications as of 03/12/2018  Medication Sig  . ACCU-CHEK FASTCLIX LANCETS  MISC USE TO CHECK BLOOD SUGARS  3-4 TIMES DAILY.  [name] aspirin 81 MG tablet Take 81 mg by mouth daily.  [name] atorvastatin (LIPITOR) 20 MG tablet TAKE 1 TABLET BY MOUTH  DAILY  . Blood  Glucose Monitoring Suppl (ACCU-CHEK NANO SMARTVIEW) w/Device KIT Use to check blood sugars 3-4 times daily  . Calcium Carbonate-Vit D-Min (CALCIUM 1200 PO) Take 2 tablets by mouth.  . Cholecalciferol (VITAMIN D3) 2000 UNITS TABS Take 1 tablet by mouth.  . fexofenadine (ALLEGRA) 180 MG tablet Take 180 mg by mouth daily.  [name] glucose blood (ACCU-CHEK SMARTVIEW) test strip USE TO TEST BLOOD SUGARS  3-4 TIMES DAILY. E11.9  . ibuprofen (ADVIL,MOTRIN) 400 MG tablet Take 400 mg by mouth at bedtime as needed. Reported on 09/04/2015  . lisinopril (PRINIVIL,ZESTRIL) 5 MG tablet TAKE 1 TABLET BY MOUTH  DAILY  . Magnesium 500 MG CAPS Take 1 capsule by mouth.  . metFORMIN (GLUCOPHAGE) 1000 MG tablet TAKE 1 TABLET BY MOUTH TWO  TIMES DAILY WITH A MEAL  . Multiple Vitamins-Minerals (MULTIVITAMIN PO) Take 1 tablet by mouth.  . Omega-3 Fatty Acids (FISH OIL PO) Take 2 tablets by mouth 2 (two) times daily. 1,000 mg  . OVER THE COUNTER MEDICATION Herbal tea 8 ounces daily  . Probiotic Product (PROBIOTIC DAILY PO) Take 1 tablet by mouth.  . Wheat Dextrin (BENEFIBER PO) Take 1 tablet by mouth.  . [DISCONTINUED] ACCU-CHEK SMARTVIEW test strip USE TO TEST BLOOD SUGARS  3-4 TIMES DAILY.   No facility-administered encounter medications on file as of 03/12/2018.     Activities of Daily Living In your present state of health, do you have any difficulty performing the following activities: 03/12/2018  Hearing? N  Vision? N  Difficulty concentrating or making decisions? N  Walking or climbing stairs? Y  Comment some neuropathy   Dressing or bathing? N  Doing errands, shopping? N  Preparing Food and eating ? N  Using the Toilet? N  In the past six months, have you accidently leaked urine? N  Do you have problems with loss of bowel control? N    Managing your Medications? N  Managing your Finances? N  Housekeeping or managing your Housekeeping? N  Some recent data might be hidden    Patient Care Team: Marin Olp, MD as PCP - General (Family Medicine)    Assessment:   This is a routine wellness examination for Samantha Snyder.  Exercise Activities and Dietary recommendations Current Exercise Habits: Home exercise routine, Type of exercise: walking(cleaning and stays busy every day ), Time (Minutes): 60, Frequency (Times/Week): 5, Weekly Exercise (Minutes/Week): 300, Intensity: Moderate  Goals    . Quit Smoking     Is trying to quit smoking Evaluating different program     . Quit smoking / using tobacco     Smoking;  Educated to avoid secondary smoke  Classes offered a couple of times a month;  Will work with the patient as far as registration and location  Meds may help; chatix (Varenicline); Zyban (Bupropion SR); Nicotine Replacement (gum; lozenges; patches; etc.)   30 pack yr smoking hx: Educated regarding LDCT; To discuss with MD at next fup. Also educated on AAA screening for men 65-75 who have smoked  Try to start thinking of your without cigarettes  I can quit          Fall Risk Fall Risk  03/12/2018 03/12/2018 03/11/2017 09/09/2016 09/04/2015  Falls in the past year? No No Yes No No  Number falls in past yr: - - 1 - -  Injury with Fall? - - No - -  Follow up - - Education provided - -  Comment - - she was no a boat, fishing  - -  Depression Screen PHQ 2/9 Scores 03/12/2018 03/12/2018 03/11/2017 09/09/2016  PHQ - 2 Score 0 0 0 0     Cognitive Function MMSE - Mini Mental State Exam 03/12/2018 03/11/2017  Not completed: (No Data) (No Data)     Ad8 score reviewed for issues:  Issues making decisions:  Less interest in hobbies / activities:  Repeats questions, stories (family complaining):  Trouble using ordinary gadgets (microwave, computer, phone):  Forgets the month or year:   Mismanaging  finances:   Remembering appts:  Daily problems with thinking and/or memory: Ad8 score is=0        Immunization History  Administered Date(s) Administered  . Hepatitis A 02/28/1948  . Hepatitis B 03/28/1947  . Influenza, High Dose Seasonal PF 03/17/2014, 03/07/2016, 03/11/2017  . Influenza,inj,Quad PF,6+ Mos 03/02/2015  . Influenza-Unspecified 03/10/2010, 02/25/2012, 03/17/2013  . MMR 05/14/1990, 05/14/1992  . Pneumococcal Conjugate-13 03/17/2013, 06/07/2013  . Pneumococcal Polysaccharide-23 09/19/2005, 02/24/2014  . Pneumococcal-Unspecified 09/19/2005  . Td 02/28/1950, 02/29/1956, 09/19/2005, 03/07/2016  . Tdap 02/29/1960  . Varicella 10/19/1991  . Zoster 02/28/2009  . Zoster Recombinat (Shingrix) 10/28/2016, 01/30/2017      Screening Tests Health Maintenance  Topic Date Due  . INFLUENZA VACCINE  01/22/2018  . OPHTHALMOLOGY EXAM  04/22/2018  . HEMOGLOBIN A1C  05/09/2018  . FOOT EXAM  11/07/2018  . COLONOSCOPY  12/21/2018  . MAMMOGRAM  04/08/2019  . TETANUS/TDAP  03/07/2026  . DEXA SCAN  Completed  . Hepatitis C Screening  Completed  . PNA vac Low Risk Adult  Completed        Plan:      PCP Notes   Health Maintenance  Took flu vaccine in clinic  Current everyday smoker w 53 pack years  Followed by Sherrell Puller for smoking cessation; Remembered a couple of times she was successful and this is possible as well   Colonoscopy 11/2015  Mammogram 03/2017 - it is scheduled this year  Will repeat bone density- states Dr. Yong Channel has ordered  dexa 03/2016 -1.4 in the spine Taking calcium and Vit D    Abnormal Screens  none  Referrals  None; she is seeing someone for neuropathy helping her w smoking cessation  Patient concerns; Trying to get cpap for her spouse and frustrated   Nurse Concerns; As noted  Next PCP apt Was seen today and blood work drawn today        I have personally reviewed and noted the following in the patient's  chart:   . Medical and social history . Use of alcohol, tobacco or illicit drugs  . Current medications and supplements . Functional ability and status . Nutritional status . Physical activity . Advanced directives . List of other physicians . Hospitalizations, surgeries, and ER visits in previous 12 months . Vitals . Screenings to include cognitive, depression, and falls . Referrals and appointments  In addition, I have reviewed and discussed with patient certain preventive protocols, quality metrics, and best practice recommendations. A written personalized care plan for preventive services as well as general preventive health recommendations were provided to patient.     Wynetta Fines, RN  03/12/2018

## 2018-03-12 ENCOUNTER — Encounter: Payer: Self-pay | Admitting: Family Medicine

## 2018-03-12 ENCOUNTER — Ambulatory Visit (INDEPENDENT_AMBULATORY_CARE_PROVIDER_SITE_OTHER): Payer: Medicare Other

## 2018-03-12 ENCOUNTER — Ambulatory Visit (INDEPENDENT_AMBULATORY_CARE_PROVIDER_SITE_OTHER): Payer: Medicare Other | Admitting: Family Medicine

## 2018-03-12 VITALS — BP 128/72 | HR 76 | Ht 69.0 in | Wt 196.0 lb

## 2018-03-12 VITALS — BP 128/72 | HR 76 | Temp 97.7°F | Ht 69.25 in | Wt 196.4 lb

## 2018-03-12 DIAGNOSIS — E7800 Pure hypercholesterolemia, unspecified: Secondary | ICD-10-CM | POA: Diagnosis not present

## 2018-03-12 DIAGNOSIS — I10 Essential (primary) hypertension: Secondary | ICD-10-CM | POA: Diagnosis not present

## 2018-03-12 DIAGNOSIS — Z0000 Encounter for general adult medical examination without abnormal findings: Secondary | ICD-10-CM

## 2018-03-12 DIAGNOSIS — I700 Atherosclerosis of aorta: Secondary | ICD-10-CM

## 2018-03-12 DIAGNOSIS — Z720 Tobacco use: Secondary | ICD-10-CM | POA: Diagnosis not present

## 2018-03-12 DIAGNOSIS — E1142 Type 2 diabetes mellitus with diabetic polyneuropathy: Secondary | ICD-10-CM

## 2018-03-12 DIAGNOSIS — M8588 Other specified disorders of bone density and structure, other site: Secondary | ICD-10-CM

## 2018-03-12 DIAGNOSIS — Z23 Encounter for immunization: Secondary | ICD-10-CM | POA: Diagnosis not present

## 2018-03-12 LAB — LIPID PANEL
Cholesterol: 128 mg/dL (ref 0–200)
HDL: 41.2 mg/dL (ref >39.00)
LDL Cholesterol: 49 mg/dL (ref 0–99)
NonHDL: 86.83
Total CHOL/HDL Ratio: 3
Triglycerides: 190 mg/dL — ABNORMAL HIGH (ref 0.0–149.0)
VLDL: 38 mg/dL (ref 0.0–40.0)

## 2018-03-12 LAB — COMPREHENSIVE METABOLIC PANEL
ALT: 17 U/L (ref 0–35)
AST: 16 U/L (ref 0–37)
Albumin: 4.1 g/dL (ref 3.5–5.2)
Alkaline Phosphatase: 91 U/L (ref 39–117)
BILIRUBIN TOTAL: 0.4 mg/dL (ref 0.2–1.2)
BUN: 20 mg/dL (ref 6–23)
CO2: 26 mEq/L (ref 19–32)
CREATININE: 0.59 mg/dL (ref 0.40–1.20)
Calcium: 9.4 mg/dL (ref 8.4–10.5)
Chloride: 108 mEq/L (ref 96–112)
GFR: 106.92 mL/min (ref >60.00)
Glucose, Bld: 148 mg/dL — ABNORMAL HIGH (ref 70–99)
Potassium: 4.2 mEq/L (ref 3.5–5.1)
SODIUM: 143 meq/L (ref 135–145)
Total Protein: 6.6 g/dL (ref 6.0–8.3)

## 2018-03-12 LAB — POC URINALSYSI DIPSTICK (AUTOMATED)
BILIRUBIN UA: NEGATIVE
Glucose, UA: NEGATIVE
KETONES UA: NEGATIVE
Leukocytes, UA: NEGATIVE
Nitrite, UA: NEGATIVE
PROTEIN UA: NEGATIVE
RBC UA: NEGATIVE
Spec Grav, UA: 1.025 (ref 1.010–1.025)
Urobilinogen, UA: 0.2 E.U./dL
pH, UA: 6 (ref 5.0–8.0)

## 2018-03-12 LAB — CBC
HCT: 45.2 % (ref 36.0–46.0)
Hemoglobin: 15 g/dL (ref 12.0–15.0)
MCHC: 33.1 g/dL (ref 30.0–36.0)
MCV: 91.3 fl (ref 78.0–100.0)
Platelets: 360 10*3/uL (ref 150.0–400.0)
RBC: 4.95 Mil/uL (ref 3.87–5.11)
RDW: 14.2 % (ref 11.5–15.5)
WBC: 9.7 10*3/uL (ref 4.0–10.5)

## 2018-03-12 LAB — TSH: TSH: 1.52 u[IU]/mL (ref 0.35–4.50)

## 2018-03-12 LAB — HEMOGLOBIN A1C: HEMOGLOBIN A1C: 7.3 % — AB (ref 4.6–6.5)

## 2018-03-12 MED ORDER — GLUCOSE BLOOD VI STRP: ORAL_STRIP | 11 refills | Status: DC

## 2018-03-12 NOTE — Progress Notes (Signed)
I have reviewed and agree with note, evaluation, plan.  See my separate note from today  Kendrell Lottman, MD  

## 2018-03-12 NOTE — Progress Notes (Signed)
Phone: [phone]  Subjective:  Patient presents today for their annual physical. Chief complaint-noted.   See problem oriented charting- ROS- full  review of systems was completed and negative except for: painful neuropathy  The following were reviewed and entered/updated in epic: Past Medical History:  Diagnosis Date  . Allergy   . Cataract    small  . Chicken pox 02/24/2014   Has had shingles vaccine  . Chronic diarrhea    For one year-needed prolonged course of antibiotics  . Diabetes mellitus without complication ([place])   . Diverticulosis 02/24/2014  . History of UTI    05/2013  . History of UTI 2014   per pt noted after taking Bactrim due to tooth infection  . Hyperlipidemia   . Neuromuscular disorder (HCC)    neuropathy both feet   . 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 Vaginal prolapse    Status post surgery. Improved urinary incontinence   Patient Active Problem List   Diagnosis Date Noted  . DM (diabetes mellitus) type II controlled, neurological manifestation ([place]) 02/24/2014    Priority: High  . Tobacco abuse 02/24/2014    Priority: High  . Osteopenia 03/30/2014    Priority: Medium  . Essential hypertension, benign 03/08/2014    Priority: Medium  . High cholesterol 02/24/2014    Priority: Medium  . Diabetic peripheral neuropathy ([place]) 10/27/2014    Priority: Low  . Seasonal allergies 02/24/2014    Priority: Low  . Aortic atherosclerosis ([place]) 07/08/2017   Past Surgical History:  Procedure Laterality Date  . ABDOMINAL HYSTERECTOMY  09-07-2013  . ABDOMINAL SACROCOLPOPEXY  09-07-2013  . BLADDER SURGERY  09/07/2013   vaginal prolapse  . COLONOSCOPY  11-06-2005   sig tics, no polyps-[facility] [place]   . CYSTOSCOPY  09-07-2013  . ENTEROCELE REPAIR  09-07-2013  . INDUCED ABORTION  1985   Therapeutic  . LAPAROSCOPIC BILATERAL SALPINGO OOPHERECTOMY  09-07-2013  . PUBOVAGINAL SLING  09-07-2013  . TONSILLECTOMY Bilateral 1955  . TUBAL  LIGATION      Family History  Problem Relation Age of Onset  . Cancer Father        Lung but was a smoker  . Stroke Father        51 massive lead to death  . Diabetes Father   . Bladder Cancer Father   . Osteoporosis Mother   . Anuerysm Mother   . Diabetes Maternal Grandmother   . Colon cancer Neg Hx   . Colon polyps Neg Hx   . Esophageal cancer Neg Hx   . Rectal cancer Neg Hx   . Stomach cancer Neg Hx   . Breast cancer Neg Hx     Medications- reviewed and updated Current Outpatient Medications  Medication Sig Dispense Refill  . ACCU-CHEK FASTCLIX LANCETS MISC USE TO CHECK BLOOD SUGARS  3-4 TIMES DAILY. 408 each 11  . aspirin 81 MG tablet Take 81 mg by mouth daily.    [name] atorvastatin (LIPITOR) 20 MG tablet TAKE 1 TABLET BY MOUTH  DAILY 90 tablet 2  . Blood Glucose Monitoring Suppl (ACCU-CHEK NANO SMARTVIEW) w/Device KIT Use to check blood sugars 3-4 times daily 1 kit 0  . Calcium Carbonate-Vit D-Min (CALCIUM 1200 PO) Take 2 tablets by mouth.    . Cholecalciferol (VITAMIN D3) 2000 UNITS TABS Take 1 tablet by mouth.    . fexofenadine (ALLEGRA) 180 MG tablet Take 180 mg by mouth daily.    [name] ibuprofen (  ADVIL,MOTRIN) 400 MG tablet Take 400 mg by mouth at bedtime as needed. Reported on 09/04/2015    . lisinopril (PRINIVIL,ZESTRIL) 5 MG tablet TAKE 1 TABLET BY MOUTH  DAILY 90 tablet 2  . Magnesium 500 MG CAPS Take 1 capsule by mouth.    . metFORMIN (GLUCOPHAGE) 1000 MG tablet TAKE 1 TABLET BY MOUTH TWO  TIMES DAILY WITH A MEAL 180 tablet 3  . Multiple Vitamins-Minerals (MULTIVITAMIN PO) Take 1 tablet by mouth.    . Omega-3 Fatty Acids (FISH OIL PO) Take 2 tablets by mouth 2 (two) times daily. 1,000 mg    . OVER THE COUNTER MEDICATION Herbal tea 8 ounces daily    . Probiotic Product (PROBIOTIC DAILY PO) Take 1 tablet by mouth.    . Wheat Dextrin (BENEFIBER PO) Take 1 tablet by mouth.    [name] glucose blood (ACCU-CHEK SMARTVIEW) test strip USE TO TEST BLOOD SUGARS  3-4 TIMES DAILY. E11.9  400 each 11   No current facility-administered medications for this visit.     Allergies-reviewed and updated No Known Allergies  Social History   Social History Narrative   Married 45 years in 2015.  Moved to [place] to be near daughter who lives here. Has a son as well and 49 year old grandson. 3 granddogs. One dog at home       Retired former  Academic librarian. Has a BS in biology from [employer]. Used to be an algologist-studied algae under microscope      Hobbies-exercising with her husband       Objective: BP 128/72 (BP Location: Left Arm, Cuff Size: Large)   Pulse 76   Temp 97.7 F (36.5 C) (Oral)   Ht 5' 9.25" (1.759 m)   Wt 196 lb 6.4 oz (89.1 kg)   SpO2 94%   BMI 28.79 kg/m  Gen: NAD, resting comfortably HEENT: Mucous membranes are moist. Oropharynx normal Neck: no thyromegaly CV: RRR no murmurs rubs or gallops Lungs: CTAB no crackles, wheeze, rhonchi Abdomen: soft/nontender/nondistended/normal bowel sounds. obese Ext: no edema Skin: warm, dry Neuro: grossly normal, moves all extremities, PERRLA  Assessment/Plan:  72 y.o. female presenting for annual physical.  Health Maintenance counseling: 1. Anticipatory guidance: Patient counseled regarding regular dental exams - she goes q4 months, eye exams - yearly, wearing seatbelts.  2. Risk factor reduction:  Advised patient of need for regular exercise and diet rich and fruits and vegetables to reduce risk of heart attack and stroke. Exercise- no regular exercise. Trying to be active in the house, walking hurting feet worse with neuropathy- she agrees to try the bike they have (aerodyne). Diet-she wan last year set longer term goal for weight closer to 150 Wt Readings from Last 3 Encounters:  03/12/18 196 lb 6.4 oz (89.1 kg)  11/06/17 191 lb 3.2 oz (86.7 kg)  07/08/17 191 lb (86.6 kg)  3. Immunizations/screenings/ancillary studies Immunization History  Administered Date(s) Administered  . Hepatitis A  02/28/1948  . Hepatitis B 03/28/1947  . Influenza, High Dose Seasonal PF 03/17/2014, 03/07/2016, 03/11/2017  . Influenza,inj,Quad PF,6+ Mos 03/02/2015  . Influenza-Unspecified 03/10/2010, 02/25/2012, 03/17/2013  . MMR 05/14/1990, 05/14/1992  . Pneumococcal Conjugate-13 03/17/2013, 06/07/2013  . Pneumococcal Polysaccharide-23 09/19/2005, 02/24/2014  . Pneumococcal-Unspecified 09/19/2005  . Td 02/28/1950, 02/29/1956, 09/19/2005, 03/07/2016  . Tdap 02/29/1960  . Varicella 10/19/1991  . Zoster 02/28/2009  . Zoster Recombinat (Shingrix) 10/28/2016, 01/30/2017   Health Maintenance Due  Topic Date Due  . INFLUENZA VACCINE - today  given high dose 01/22/2018  4. Cervical cancer screening-  passed age based screening. No history abnormal. No vaginal bleeding.  5. Breast cancer screening-  breast exam declined (prefers self exams only) and mammogram -oct 2018 and scheduled for this October 2019 6. Colon cancer screening -  repeat planned 2020 after polyps in 2017 7. Skin cancer screening- no dermatologist. Has one patch of hair loss without obvious scarring- has had this before in another spot and grew back- could refer to dermatolgoy if worsens. advised regular sunscreen use. Denies worrisome, changing, or new skin lesions.  8. Birth control/STD check- postmenopausal/monogomous 9. Osteoporosis screening at 28-  osteopenia noted 03/2016. Plan is repeat bone density next month. I ordered this today and asked patient to schedule 04/05/16 or later at breast center 10. Tobacco abuse- not ready to quit- doing 1 pack per day. chantix didn't help. She is in lung cancer screening program - we discussed this is the #1 thing she can do for her health- quitting smoking  Status of chronic or acute concerns   HTN- controlled on lisinopril 5m  HLD- controlled on lipitor 212m update lipids with LDL goal at least under 100  DM with neurological manifestations/neuropathy- using diabetic shoes to prevent  ulceration. She is on metformin 1 g BID. Last a1c at goal 7 or less. Has pain from neuropathy- seeing someone with ca[place]eurmuscular therapy to try to help with her feet out in [place]- doing laser therapy and insurance doesn't cover it- she thinks its helping. Costly too her though. This person is also a hypnotist and may work - $90 an hour  Some venous insufficiency- using diabetic compression socks  Aortic atherosclerosis- also CAD on circumflex in scan- will continue to control BP, lipids, DM and encourage smoking cessation  Will send in diabetic shoe forms- she thinks these will be due in january  Future Appointments  Date Time Provider [place]9/19/2019 10:00 AM [facility] HEALTH COACH [facility]  04/08/2018 10:10 AM [facility] MM 3 [facility]   Return in about 4 months (around 07/12/2018) for follow up- or sooner if needed.  Lab/Order associations: Preventative health care - Plan: TSH  Controlled type 2 diabetes mellitus with diabetic polyneuropathy, without long-term current use of insulin (HCC) - Plan: CBC, Comprehensive metabolic panel, Lipid panel, Hemoglobin A1c  Tobacco abuse - Plan: POCT Urinalysis Dipstick (Automated)  Essential hypertension, benign - Plan: CBC, Comprehensive metabolic panel, Lipid panel, POCT Urinalysis Dipstick (Automated)  Diabetic peripheral neuropathy (HCC)  Aortic atherosclerosis (HCC)  Osteopenia of lumbar spine - Plan: DG Bone Density, CANCELED: DG Bone Density  High cholesterol - Plan: TSH  Meds ordered this encounter  Medications  . glucose blood (ACCU-CHEK SMARTVIEW) test strip    Sig: USE TO TEST BLOOD SUGARS  3-4 TIMES DAILY. E11.9    Dispense:  400 each    Refill:  11    Accu-Chek Smartview Nano Test Strips   Return precautions advised.  StGarret ReddishMD

## 2018-03-12 NOTE — Patient Instructions (Addendum)
Samantha Snyder , Thank you for taking time to come for your Medicare Wellness Visit. I appreciate your ongoing commitment to your health goals. Please review the following plan we discussed and let me know if I can assist you in the future.   Took high dose flu vaccine in clinic  Good luck to you!!!    These are the goals we discussed: Goals    . Quit smoking / using tobacco     Smoking;  Educated to avoid secondary smoke  Classes offered a couple of times a month;  Will work with the patient as far as registration and location  Meds may help; chatix (Varenicline); Zyban (Bupropion SR); Nicotine Replacement (gum; lozenges; patches; etc.)   30 pack yr smoking hx: Educated regarding LDCT; To discuss with MD at next fup. Also educated on AAA screening for men 65-75 who have smoked  Try to start thinking of your without cigarettes  I can quit          This is a list of the screening recommended for you and due dates:  Health Maintenance  Topic Date Due  . Flu Shot  01/22/2018  . Eye exam for diabetics  04/22/2018  . Hemoglobin A1C  05/09/2018  . Complete foot exam   11/07/2018  . Colon Cancer Screening  12/21/2018  . Mammogram  04/08/2019  . Tetanus Vaccine  03/07/2026  . DEXA scan (bone density measurement)  Completed  .  Hepatitis C: One time screening is recommended by Center for Disease Control  (CDC) for  adults born from [birth year] through 1963.   Completed  . Pneumonia vaccines  Completed   Health Maintenance, Female Adopting a healthy lifestyle and getting preventive care can go a long way to promote health and wellness. Talk with your health care provider about what schedule of regular examinations is right for you. This is a good chance for you to check in with your provider about disease prevention and staying healthy. In between checkups, there are plenty of things you can do on your own. Experts have done a lot of research about which lifestyle changes and preventive  measures are most likely to keep you healthy. Ask your health care provider for more information. Weight and diet Eat a healthy diet  Be sure to include plenty of vegetables, fruits, low-fat dairy products, and lean protein.  Do not eat a lot of foods high in solid fats, added sugars, or salt.  Get regular exercise. This is one of the most important things you can do for your health. ? Most adults should exercise for at least 150 minutes each week. The exercise should increase your heart rate and make you sweat (moderate-intensity exercise). ? Most adults should also do strengthening exercises at least twice a week. This is in addition to the moderate-intensity exercise.  Maintain a healthy weight  Body mass index (BMI) is a measurement that can be used to identify possible weight problems. It estimates body fat based on height and weight. Your health care provider can help determine your BMI and help you achieve or maintain a healthy weight.  For females 71 years of age and older: ? A BMI below 18.5 is considered underweight. ? A BMI of 18.5 to 24.9 is normal. ? A BMI of 25 to 29.9 is considered overweight. ? A BMI of 30 and above is considered obese.  Watch levels of cholesterol and blood lipids  You should start having your blood tested for lipids and  cholesterol at 70 years of age, then have this test every 5 years.  You may need to have your cholesterol levels checked more often if: ? Your lipid or cholesterol levels are high. ? You are older than 70 years of age. ? You are at high risk for heart disease.  Cancer screening Lung Cancer  Lung cancer screening is recommended for adults 40-20 years old who are at high risk for lung cancer because of a history of smoking.  A yearly low-dose CT scan of the lungs is recommended for people who: ? Currently smoke. ? Have quit within the past 15 years. ? Have at least a 30-pack-year history of smoking. A pack year is smoking an  average of one pack of cigarettes a day for 1 year.  Yearly screening should continue until it has been 15 years since you quit.  Yearly screening should stop if you develop a health problem that would prevent you from having lung cancer treatment.  Breast Cancer  Practice breast self-awareness. This means understanding how your breasts normally appear and feel.  It also means doing regular breast self-exams. Let your health care provider know about any changes, no matter how small.  If you are in your 20s or 30s, you should have a clinical breast exam (CBE) by a health care provider every 1-3 years as part of a regular health exam.  If you are 49 or older, have a CBE every year. Also consider having a breast X-ray (mammogram) every year.  If you have a family history of breast cancer, talk to your health care provider about genetic screening.  If you are at high risk for breast cancer, talk to your health care provider about having an MRI and a mammogram every year.  Breast cancer gene (BRCA) assessment is recommended for women who have family members with BRCA-related cancers. BRCA-related cancers include: ? Breast. ? Ovarian. ? Tubal. ? Peritoneal cancers.  Results of the assessment will determine the need for genetic counseling and BRCA1 and BRCA2 testing.  Cervical Cancer Your health care provider may recommend that you be screened regularly for cancer of the pelvic organs (ovaries, uterus, and vagina). This screening involves a pelvic examination, including checking for microscopic changes to the surface of your cervix (Pap test). You may be encouraged to have this screening done every 3 years, beginning at age 42.  For women ages 7-65, health care providers may recommend pelvic exams and Pap testing every 3 years, or they may recommend the Pap and pelvic exam, combined with testing for human papilloma virus (HPV), every 5 years. Some types of HPV increase your risk of cervical  cancer. Testing for HPV may also be done on women of any age with unclear Pap test results.  Other health care providers may not recommend any screening for nonpregnant women who are considered low risk for pelvic cancer and who do not have symptoms. Ask your health care provider if a screening pelvic exam is right for you.  If you have had past treatment for cervical cancer or a condition that could lead to cancer, you need Pap tests and screening for cancer for at least 20 years after your treatment. If Pap tests have been discontinued, your risk factors (such as having a new sexual partner) need to be reassessed to determine if screening should resume. Some women have medical problems that increase the chance of getting cervical cancer. In these cases, your health care provider may recommend more frequent screening  and Pap tests.  Colorectal Cancer  This type of cancer can be detected and often prevented.  Routine colorectal cancer screening usually begins at 71 years of age and continues through 70 years of age.  Your health care provider may recommend screening at an earlier age if you have risk factors for colon cancer.  Your health care provider may also recommend using home test kits to check for hidden blood in the stool.  A small camera at the end of a tube can be used to examine your colon directly (sigmoidoscopy or colonoscopy). This is done to check for the earliest forms of colorectal cancer.  Routine screening usually begins at age 46.  Direct examination of the colon should be repeated every 5-10 years through 70 years of age. However, you may need to be screened more often if early forms of precancerous polyps or small growths are found.  Skin Cancer  Check your skin from head to toe regularly.  Tell your health care provider about any new moles or changes in moles, especially if there is a change in a mole's shape or color.  Also tell your health care provider if you  have a mole that is larger than the size of a pencil eraser.  Always use sunscreen. Apply sunscreen liberally and repeatedly throughout the day.  Protect yourself by wearing long sleeves, pants, a wide-brimmed hat, and sunglasses whenever you are outside.  Heart disease, diabetes, and high blood pressure  High blood pressure causes heart disease and increases the risk of stroke. High blood pressure is more likely to develop in: ? People who have blood pressure in the high end of the normal range (130-139/85-89 mm Hg). ? People who are overweight or obese. ? People who are African American.  If you are 16-61 years of age, have your blood pressure checked every 3-5 years. If you are 25 years of age or older, have your blood pressure checked every year. You should have your blood pressure measured twice-once when you are at a hospital or clinic, and once when you are not at a hospital or clinic. Record the average of the two measurements. To check your blood pressure when you are not at a hospital or clinic, you can use: ? An automated blood pressure machine at a pharmacy. ? A home blood pressure monitor.  If you are between 35 years and 102 years old, ask your health care provider if you should take aspirin to prevent strokes.  Have regular diabetes screenings. This involves taking a blood sample to check your fasting blood sugar level. ? If you are at a normal weight and have a low risk for diabetes, have this test once every three years after 70 years of age. ? If you are overweight and have a high risk for diabetes, consider being tested at a younger age or more often. Preventing infection Hepatitis B  If you have a higher risk for hepatitis B, you should be screened for this virus. You are considered at high risk for hepatitis B if: ? You were born in a country where hepatitis B is common. Ask your health care provider which countries are considered high risk. ? Your parents were born in  a high-risk country, and you have not been immunized against hepatitis B (hepatitis B vaccine). ? You have HIV or AIDS. ? You use needles to inject street drugs. ? You live with someone who has hepatitis B. ? You have had sex with someone who  has hepatitis B. ? You get hemodialysis treatment. ? You take certain medicines for conditions, including cancer, organ transplantation, and autoimmune conditions.  Hepatitis C  Blood testing is recommended for: ? Everyone born from [birth year] through 1965. ? Anyone with known risk factors for hepatitis C.  Sexually transmitted infections (STIs)  You should be screened for sexually transmitted infections (STIs) including gonorrhea and chlamydia if: ? You are sexually active and are younger than 70 years of age. ? You are older than 70 years of age and your health care provider tells you that you are at risk for this type of infection. ? Your sexual activity has changed since you were last screened and you are at an increased risk for chlamydia or gonorrhea. Ask your health care provider if you are at risk.  If you do not have HIV, but are at risk, it may be recommended that you take a prescription medicine daily to prevent HIV infection. This is called pre-exposure prophylaxis (PrEP). You are considered at risk if: ? You are sexually active and do not regularly use condoms or know the HIV status of your partner(s). ? You take drugs by injection. ? You are sexually active with a partner who has HIV.  Talk with your health care provider about whether you are at high risk of being infected with HIV. If you choose to begin PrEP, you should first be tested for HIV. You should then be tested every 3 months for as long as you are taking PrEP. Pregnancy  If you are premenopausal and you may become pregnant, ask your health care provider about preconception counseling.  If you may become pregnant, take 400 to 800 micrograms (mcg) of folic acid every day.  If  you want to prevent pregnancy, talk to your health care provider about birth control (contraception). Osteoporosis and menopause  Osteoporosis is a disease in which the bones lose minerals and strength with aging. This can result in serious bone fractures. Your risk for osteoporosis can be identified using a bone density scan.  If you are 56 years of age or older, or if you are at risk for osteoporosis and fractures, ask your health care provider if you should be screened.  Ask your health care provider whether you should take a calcium or vitamin D supplement to lower your risk for osteoporosis.  Menopause may have certain physical symptoms and risks.  Hormone replacement therapy may reduce some of these symptoms and risks. Talk to your health care provider about whether hormone replacement therapy is right for you. Follow these instructions at home:  Schedule regular health, dental, and eye exams.  Stay current with your immunizations.  Do not use any tobacco products including cigarettes, chewing tobacco, or electronic cigarettes.  If you are pregnant, do not drink alcohol.  If you are breastfeeding, limit how much and how often you drink alcohol.  Limit alcohol intake to no more than 1 drink per day for nonpregnant women. One drink equals 12 ounces of beer, 5 ounces of wine, or 1 ounces of hard liquor.  Do not use street drugs.  Do not share needles.  Ask your health care provider for help if you need support or information about quitting drugs.  Tell your health care provider if you often feel depressed.  Tell your health care provider if you have ever been abused or do not feel safe at home. This information is not intended to replace advice given to you by your health care  provider. Make sure you discuss any questions you have with your health care provider. Document Released: 12/24/2010 Document Revised: 11/16/2015 Document Reviewed: 03/14/2015 Elsevier Interactive  Patient Education  2018 [place] in the Home Falls can cause injuries. They can happen to people of all ages. There are many things you can do to make your home safe and to help prevent falls. What can I do on the outside of my home?  Regularly fix the edges of walkways and driveways and fix any cracks.  Remove anything that might make you trip as you walk through a door, such as a raised step or threshold.  Trim any bushes or trees on the path to your home.  Use bright outdoor lighting.  Clear any walking paths of anything that might make someone trip, such as rocks or tools.  Regularly check to see if handrails are loose or broken. Make sure that both sides of any steps have handrails.  Any raised decks and porches should have guardrails on the edges.  Have any leaves, snow, or ice cleared regularly.  Use sand or salt on walking paths during winter.  Clean up any spills in your garage right away. This includes oil or grease spills. What can I do in the bathroom?  Use night lights.  Install grab bars by the toilet and in the tub and shower. Do not use towel bars as grab bars.  Use non-skid mats or decals in the tub or shower.  If you need to sit down in the shower, use a plastic, non-slip stool.  Keep the floor dry. Clean up any water that spills on the floor as soon as it happens.  Remove soap buildup in the tub or shower regularly.  Attach bath mats securely with double-sided non-slip rug tape.  Do not have throw rugs and other things on the floor that can make you trip. What can I do in the bedroom?  Use night lights.  Make sure that you have a light by your bed that is easy to reach.  Do not use any sheets or blankets that are too big for your bed. They should not hang down onto the floor.  Have a firm chair that has side arms. You can use this for support while you get dressed.  Do not have throw rugs and other things on the floor  that can make you trip. What can I do in the kitchen?  Clean up any spills right away.  Avoid walking on wet floors.  Keep items that you use a lot in easy-to-reach places.  If you need to reach something above you, use a strong step stool that has a grab bar.  Keep electrical cords out of the way.  Do not use floor polish or wax that makes floors slippery. If you must use wax, use non-skid floor wax.  Do not have throw rugs and other things on the floor that can make you trip. What can I do with my stairs?  Do not leave any items on the stairs.  Make sure that there are handrails on both sides of the stairs and use them. Fix handrails that are broken or loose. Make sure that handrails are as long as the stairways.  Check any carpeting to make sure that it is firmly attached to the stairs. Fix any carpet that is loose or worn.  Avoid having throw rugs at the top or bottom of the stairs. If you do have  throw rugs, attach them to the floor with carpet tape.  Make sure that you have a light switch at the top of the stairs and the bottom of the stairs. If you do not have them, ask someone to add them for you. What else can I do to help prevent falls?  Wear shoes that: ? Do not have high heels. ? Have rubber bottoms. ? Are comfortable and fit you well. ? Are closed at the toe. Do not wear sandals.  If you use a stepladder: ? Make sure that it is fully opened. Do not climb a closed stepladder. ? Make sure that both sides of the stepladder are locked into place. ? Ask someone to hold it for you, if possible.  Clearly mark and make sure that you can see: ? Any grab bars or handrails. ? First and last steps. ? Where the edge of each step is.  Use tools that help you move around (mobility aids) if they are needed. These include: ? Canes. ? Walkers. ? Scooters. ? Crutches.  Turn on the lights when you go into a dark area. Replace any light bulbs as soon as they burn  out.  Set up your furniture so you have a clear path. Avoid moving your furniture around.  If any of your floors are uneven, fix them.  If there are any pets around you, be aware of where they are.  Review your medicines with your doctor. Some medicines can make you feel dizzy. This can increase your chance of falling. Ask your doctor what other things that you can do to help prevent falls. This information is not intended to replace advice given to you by your health care provider. Make sure you discuss any questions you have with your health care provider. Document Released: 04/06/2009 Document Revised: 11/16/2015 Document Reviewed: 07/15/2014 Elsevier Interactive Patient Education  [employer].

## 2018-03-12 NOTE — Addendum Note (Signed)
Addended by: Kayren Eaves T on: 03/12/2018 09:39 AM   Modules accepted: Orders

## 2018-03-12 NOTE — Addendum Note (Signed)
Addended by: Mariam Dollar, Roselyn Reef M on: 03/12/2018 11:55 AM   Modules accepted: Orders

## 2018-03-12 NOTE — Patient Instructions (Addendum)
Health Maintenance Due  Topic Date Due  . INFLUENZA VACCINE - high dose flu shot today 01/22/2018   Call breast center to schedule your bone density at the same time as mammogram- I think needs to be 04/05/16 or laster  Please stop by lab before you go

## 2018-03-19 ENCOUNTER — Encounter: Payer: Self-pay | Admitting: Family Medicine

## 2018-03-20 ENCOUNTER — Other Ambulatory Visit: Payer: Self-pay

## 2018-03-20 MED ORDER — ACCU-CHEK FASTCLIX LANCETS MISC: 11 refills | Status: DC

## 2018-04-08 ENCOUNTER — Ambulatory Visit: Payer: Medicare Other

## 2018-04-13 ENCOUNTER — Ambulatory Visit
Admission: RE | Admit: 2018-04-13 | Discharge: 2018-04-13 | Disposition: A | Discharge status: Home / Self Care | Payer: Medicare Other | Source: Ambulatory Visit | Attending: Family Medicine | Admitting: Family Medicine

## 2018-04-13 DIAGNOSIS — Z1231 Encounter for screening mammogram for malignant neoplasm of breast: Secondary | ICD-10-CM

## 2018-04-13 DIAGNOSIS — M8588 Other specified disorders of bone density and structure, other site: Secondary | ICD-10-CM

## 2018-04-27 LAB — HM DIABETES EYE EXAM

## 2018-04-28 ENCOUNTER — Encounter: Payer: Self-pay | Admitting: Family Medicine

## 2018-06-12 ENCOUNTER — Other Ambulatory Visit: Payer: Self-pay | Admitting: Family Medicine

## 2018-07-14 ENCOUNTER — Encounter: Payer: Self-pay | Admitting: Family Medicine

## 2018-07-14 ENCOUNTER — Ambulatory Visit: Payer: Medicare Other | Admitting: Family Medicine

## 2018-07-14 VITALS — BP 110/70 | HR 74 | Temp 97.4°F | Ht 69.0 in | Wt 190.4 lb

## 2018-07-14 DIAGNOSIS — E1142 Type 2 diabetes mellitus with diabetic polyneuropathy: Secondary | ICD-10-CM

## 2018-07-14 DIAGNOSIS — Z720 Tobacco use: Secondary | ICD-10-CM | POA: Diagnosis not present

## 2018-07-14 DIAGNOSIS — E785 Hyperlipidemia, unspecified: Secondary | ICD-10-CM

## 2018-07-14 DIAGNOSIS — E1169 Type 2 diabetes mellitus with other specified complication: Secondary | ICD-10-CM | POA: Diagnosis not present

## 2018-07-14 DIAGNOSIS — I10 Essential (primary) hypertension: Secondary | ICD-10-CM

## 2018-07-14 DIAGNOSIS — E1159 Type 2 diabetes mellitus with other circulatory complications: Secondary | ICD-10-CM

## 2018-07-14 DIAGNOSIS — M79644 Pain in right finger(s): Secondary | ICD-10-CM

## 2018-07-14 LAB — POCT GLYCOSYLATED HEMOGLOBIN (HGB A1C): Hemoglobin A1C: 6.4 % — AB (ref 4.0–5.6)

## 2018-07-14 NOTE — Assessment & Plan Note (Addendum)
Diabetic neuropathy S: mild poorly controlled in past on metformin 1g BID.  Exercise and diet- down 6 lbs- has been trying to cut down on sugar and cut down on carbs (really likes pasta) - walking in the pool.  Using diabetic shoes to prevent ulceration. continued pain from neuopathy- seeing [place] [facility] in [place]. Has had to stop doing regular walking Lab Results  Component Value Date   HGBA1C 7.3 (H) 03/12/2018   HGBA1C 7.0 (H) 11/06/2017   HGBA1C 6.7 07/08/2017   A/P: hopefully improved- update a1c - neuropathy appears stable- continue current rx

## 2018-07-14 NOTE — Progress Notes (Signed)
Subjective:  Samantha Snyder is a 73 y.o. year old very pleasant female patient who presents for/with See problem oriented charting ROS-continued pain as well as numbness into bilateral feet.  No chest pain or shortness of breath reported.  No headache or blurry vision reported  Past Medical History-  Patient Active Problem List   Diagnosis Date Noted  . DM (diabetes mellitus) type II controlled, neurological manifestation ([place]) 02/24/2014    Priority: High  . Tobacco abuse 02/24/2014    Priority: High  . Osteopenia 03/30/2014    Priority: Medium  . Hypertension associated with diabetes ([place]) 03/08/2014    Priority: Medium  . Hyperlipidemia associated with type 2 diabetes mellitus ([place]) 02/24/2014    Priority: Medium  . Diabetic peripheral neuropathy ([place]) 10/27/2014    Priority: Low  . Seasonal allergies 02/24/2014    Priority: Low  . Aortic atherosclerosis ([place]) 07/08/2017    Medications- reviewed and updated Current Outpatient Medications  Medication Sig Dispense Refill  . ACCU-CHEK FASTCLIX LANCETS MISC USE TO CHECK BLOOD SUGARS  3-4 TIMES DAILY. E11.4 408 each 11  . aspirin 81 MG tablet Take 81 mg by mouth daily.    [name] atorvastatin (LIPITOR) 20 MG tablet TAKE 1 TABLET BY MOUTH  DAILY 90 tablet 2  . Blood Glucose Monitoring Suppl (ACCU-CHEK NANO SMARTVIEW) w/Device KIT Use to check blood sugars 3-4 times daily 1 kit 0  . Calcium Carbonate-Vit D-Min (CALCIUM 1200 PO) Take 2 tablets by mouth.    . Cholecalciferol (VITAMIN D3) 2000 UNITS TABS Take 1 tablet by mouth.    . fexofenadine (ALLEGRA) 180 MG tablet Take 180 mg by mouth daily.    [name] glucose blood (ACCU-CHEK SMARTVIEW) test strip USE TO TEST BLOOD SUGARS  3-4 TIMES DAILY. E11.9 400 each 11  . ibuprofen (ADVIL,MOTRIN) 400 MG tablet Take 400 mg by mouth at bedtime as needed. Reported on 09/04/2015    . lisinopril (PRINIVIL,ZESTRIL) 5 MG tablet TAKE 1 TABLET BY MOUTH  DAILY 90 tablet 2  . Magnesium 500 MG CAPS Take 1 capsule by  mouth.    . metFORMIN (GLUCOPHAGE) 1000 MG tablet TAKE 1 TABLET BY MOUTH TWO  TIMES DAILY WITH A MEAL 180 tablet 3  . Multiple Vitamins-Minerals (MULTIVITAMIN PO) Take 1 tablet by mouth.    . Omega-3 Fatty Acids (FISH OIL PO) Take 2 tablets by mouth 2 (two) times daily. 1,000 mg    . OVER THE COUNTER MEDICATION Herbal tea 8 ounces daily    . Probiotic Product (PROBIOTIC DAILY PO) Take 1 tablet by mouth.    . Wheat Dextrin (BENEFIBER PO) Take 1 tablet by mouth.     No current facility-administered medications for this visit.     Objective: BP 110/70 (BP Location: Left Arm, Patient Position: Sitting, Cuff Size: Normal)   Pulse 74   Temp (!) 97.4 F (36.3 C) (Oral)   Ht _0  (1.753 m)   Wt 190 lb 6.4 oz (86.4 kg)   LMP  (LMP Unknown)   SpO2 94%   BMI 28.12 kg/m  Gen: NAD, resting comfortably, smells of smoke CV: RRR no murmurs rubs or gallops Lungs: CTAB no crackles, wheeze, rhonchi Abdomen: soft/nontender/nondistended Ext: no edema Skin: warm, dry   Diabetic Foot Exam - Simple   Simple Foot Form Diabetic Foot exam was performed with the following findings:  Yes 07/14/2018  3:05 PM  Visual Inspection See comments:  Yes Sensation Testing See comments:  Yes Pulse Check Posterior Tibialis and Dorsalis pulse  intact bilaterally:  Yes Comments Patient with no sensation to monofilament examination on all toes Bunion deformity noted bilaterally, hammertoe deformity noted bilaterally worse on the left foot-callus/mild erythema on top of hammertoe on the left foot. Slight callous at IP joint great toe left foot      Assessment/Plan:  Other notes: 1.also taking 4000 units of vitamin D 2. Still smoking- has not pursued hypnotism. She is trying to cut back- didn't do well on patches due to rash or with lozenges. Trying to get to 10 cigs per day 3. Right first finger- has some pain at DIP joint- will refer to Dr. Paulla Fore. I suspect this is arthritis  Hypertension S: controlled on  lisinopril 5 mg BP Readings from Last 3 Encounters:  07/14/18 110/70  03/12/18 128/72  03/12/18 128/72  A/P: stable at  blood pressure goal of <140/90. Continue current meds  Hyperlipidemia S:  controlled on atorvastatin 44m Lab Results  Component Value Date   CHOL 128 03/12/2018   HDL 41.20 03/12/2018   LDLCALC 49 03/12/2018   LDLDIRECT 73.0 03/11/2017   TRIG 190.0 (H) 03/12/2018   CHOLHDL 3 03/12/2018   A/P: stable- continue current medicines.   DM (diabetes mellitus) type II controlled, neurological manifestation Diabetic neuropathy S: mild poorly controlled in past on metformin 1g BID.  Exercise and diet- down 6 lbs- has been trying to cut down on sugar and cut down on carbs (really likes pasta) - walking in the pool.  Using diabetic shoes to prevent ulceration. continued pain from neuopathy- seeing [facility] [place]. Has had to stop doing regular walking Lab Results  Component Value Date   HGBA1C 7.3 (H) 03/12/2018   HGBA1C 7.0 (H) 11/06/2017   HGBA1C 6.7 07/08/2017   A/P: hopefully improved- update a1c - neuropathy appears stable- continue current rx  Tobacco abuse Still smoking- has not pursued hypnotism. She is trying to cut back- didn't do well on patches due to rash or with lozenges. Trying to get to 10 cigs per day   Future Appointments  Date Time Provider DGaastra 07/17/2018  9:20 AM RGerda Diss DO [facility]  03/15/2019  8:20 AM HMarin Olp MD [facility]  03/15/2019 10:00 AM [facility] HEALTH COACH [facility]   Recommended 465-monthollow-up if A1c is elevated-if not I am okay with September visit for physical  Lab/Order associations: Controlled type 2 diabetes mellitus with diabetic polyneuropathy, without long-term current use of insulin ([place]- Plan: POCT glycosylated hemoglobin (Hb A1C)  Hyperlipidemia associated with type 2 diabetes mellitus ([place] Hypertension associated with diabetes  ([place] Tobacco abuse  Finger pain, right - Plan: Ambulatory referral to Sports Medicine  Return precautions advised.  StGarret ReddishMD

## 2018-07-14 NOTE — Assessment & Plan Note (Signed)
Still smoking- has not pursued hypnotism. She is trying to cut back- didn't do well on patches due to rash or with lozenges. Trying to get to 10 cigs per day

## 2018-07-14 NOTE — Patient Instructions (Addendum)
Please schedule a visit with our excellent sports medicine physician Dr. Paulla Fore before you leave at the check out desk so he can further evaluate your right finger pain  As long as a1c is 7.3 or less- lets continue to work on healthy eating and regular exercise and recheck in 4 months.   Keep up the efforts to cut down/quit smoking  No other changes today

## 2018-07-16 ENCOUNTER — Encounter: Payer: Self-pay | Admitting: Family Medicine

## 2018-07-17 ENCOUNTER — Ambulatory Visit: Payer: Medicare Other | Admitting: Sports Medicine

## 2018-07-17 ENCOUNTER — Ambulatory Visit (INDEPENDENT_AMBULATORY_CARE_PROVIDER_SITE_OTHER): Payer: Medicare Other

## 2018-07-17 ENCOUNTER — Encounter: Payer: Self-pay | Admitting: Sports Medicine

## 2018-07-17 VITALS — BP 106/72 | HR 77 | Ht 69.0 in | Wt 192.0 lb

## 2018-07-17 DIAGNOSIS — M19042 Primary osteoarthritis, left hand: Secondary | ICD-10-CM

## 2018-07-17 DIAGNOSIS — M79641 Pain in right hand: Secondary | ICD-10-CM | POA: Diagnosis not present

## 2018-07-17 DIAGNOSIS — M180 Bilateral primary osteoarthritis of first carpometacarpal joints: Secondary | ICD-10-CM

## 2018-07-17 DIAGNOSIS — M79642 Pain in left hand: Secondary | ICD-10-CM

## 2018-07-17 DIAGNOSIS — M19041 Primary osteoarthritis, right hand: Secondary | ICD-10-CM | POA: Diagnosis not present

## 2018-07-17 MED ORDER — DICLOFENAC SODIUM 1 % TD GEL: TRANSDERMAL | 1 refills | Status: DC

## 2018-07-17 NOTE — Progress Notes (Signed)
Samantha Snyder. Samantha Snyder, [place] at [facility] [phone]  Alinda Deem - 73 y.o. female MRN [id number]  Date of birth: [birth year]  Visit Date: July 17, 2018  PCP: Marin Olp, MD   Referred by: Marin Olp, MD  SUBJECTIVE:  Chief Complaint  Patient presents with  . Right Index Finger - Initial Assessment  . Initial Assessment    Referred by Dr. Yong Channel.     HPI: Patient presents with several years of worsening generalized hand pain and slight swelling of her index finger however since September 2019 she has had marked changes in the appearance of her right index finger including thickening of the fingernail.  She has had marked swelling within the DIP and discomfort with this.  She has soreness.  There is swelling.  Slight erythema but no warmth.  She has pain with bending and using it.  It does help if she rests it.  She has not tried any specific medications.  REVIEW OF SYSTEMS: Denies night time disturbances.  Denies fevers, chills, or night sweats. Denies unexplained weight loss. Denies personal history of cancer. Denies changes in bowel or bladder habits. Denies recent unreported falls. Denies new or worsening dyspnea or wheezing. Denies headaches or dizziness.  Denies numbness, tingling or weakness In the extremities.  Denies dizziness or presyncopal episodes Reports, chronic lower extremity edema   HISTORY:  Prior history reviewed and updated per electronic medical record.  Social History   Occupational History  . Not on file  Tobacco Use  . Smoking status: Current Every Day Smoker    Packs/day: 1.00    Years: 53.00    Pack years: 53.00    Types: Cigarettes  . Smokeless tobacco: Never Used  . Tobacco comment: Counseled that the single most powerful action she could take to decrease her risk of lung cancer is to quit smoking  Substance and Sexual Activity  . Alcohol use: No    Alcohol/week: 0.0 standard  drinks  . Drug use: No  . Sexual activity: Not on file   Social History   Social History Narrative   Married 45 years in 2015.  Moved to [place] to be near daughter who lives here. Has a son as well and 71 year old grandson. 3 granddogs. One dog at home       Retired former  Academic librarian. Has a BS in biology from [employer]. Used to be an algologist-studied algae under microscope      Hobbies-exercising with her husband      Past Medical History:  Diagnosis Date  . Allergy   . Cataract    small  . Chicken pox 02/24/2014   Has had shingles vaccine  . Chronic diarrhea    For one year-needed prolonged course of antibiotics  . Diabetes mellitus without complication ([place])   . Diverticulosis 02/24/2014  . History of UTI    05/2013  . History of UTI 2014   per pt noted after taking Bactrim due to tooth infection  . Hyperlipidemia   . Neuromuscular disorder ([facility])    neuropathy both feet   . 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 Vaginal prolapse    Status post surgery. Improved urinary incontinence   Past Surgical History:  Procedure Laterality Date  . ABDOMINAL HYSTERECTOMY  09-07-2013  . ABDOMINAL SACROCOLPOPEXY  09-07-2013  . BLADDER SURGERY  09/07/2013   vaginal prolapse  . COLONOSCOPY  11-06-2005   sig tics, no polyps-[facility] [place]   . CYSTOSCOPY  09-07-2013  . ENTEROCELE REPAIR  09-07-2013  . INDUCED ABORTION  1985   Therapeutic  . LAPAROSCOPIC BILATERAL SALPINGO OOPHERECTOMY  09-07-2013  . PUBOVAGINAL SLING  09-07-2013  . TONSILLECTOMY Bilateral 1955  . TUBAL LIGATION     family history includes Anuerysm in her mother; Bladder Cancer in her father; Cancer in her father; Diabetes in her father and maternal grandmother; Osteoporosis in her mother; Stroke in her father. There is no history of Colon cancer, Colon polyps, Esophageal cancer, Rectal cancer, Stomach cancer, or Breast cancer.  DATA OBTAINED & REVIEWED:  Recent Labs     11/06/17 0909 03/12/18 0827 03/12/18 0839 07/14/18 1123  HGBA1C 7.0* 7.3*  --  6.4*  CALCIUM 9.4 9.4  --   --   AST  --  16  --   --   ALT  --  17  --   --   TSH  --   --  1.52  --     No problems updated. No specialty comments available.   OBJECTIVE:  VS:  HT:5\' 9"  (175.3 cm)   WT:192 lb (87.1 kg)  BMI:28.34    BP:106/72  HR:77bpm  TEMP: ( )  RESP:95 %   PHYSICAL EXAM: CONSTITUTIONAL: Well-developed, Well-nourished and In no acute distress EYES: Pupils are equal., EOM intact without nystagmus. and No scleral icterus. Psychiatric: Alert & appropriately interactive. and Not depressed or anxious appearing. EXTREMITY EXAM: Warm and well perfused  Bilateral hands have general osteoarthritic appearance of the DIP and PIP diffusely with most focal swelling and a significant Heberden node of the right index finger.  This is slightly tender to palpation.  She does have a small amount of thickening of the right index finger nailbed with slight discoloration but this is minimal.  Good capillary refill.   X-rays of bilateral hands reviewed that do show significant degenerative changes diffusely with erosions of the right index finger DIP.   ASSESSMENT  1. Bilateral hand pain   2. Primary osteoarthritis of both hands   3. Arthritis of carpometacarpal (CMC) joint of both thumbs      PROCEDURES:  None  PLAN:  Pertinent additional documentation may be included in corresponding procedure notes, imaging studies, problem based documentation and patient instructions.  No problem-specific Assessment & Plan notes found for this encounter. Symptoms are consistent with fairly advanced osteoarthritis of the hands.  She will benefit from topical diclofenac gel and we did discuss the option for injection therapy.  She is a smoker and this has a low likelihood but possibility of early Buerger's disease given the nailbed changes however I think this is more onychomycotic in nature.  She reports  the nails have been improving however and we will defer any systemic treatment at this time.  Discussed the option for intra-articular injection but overall this is not symptomatic enough to pursue this at this time.  Activity modifications and the importance of avoiding exacerbating activities (limiting pain to no more than a 4 / 10 during or following activity) recommended and discussed. Discussed red flag symptoms that warrant earlier emergent evaluation and patient voices understanding.   Meds ordered this encounter  Medications  . diclofenac sodium (VOLTAREN) 1 % GEL    Sig: Apply topically to affected area qid    Dispense:  100 g    Refill:  1   Lab Orders  No laboratory test(s) ordered today  Imaging Orders  DG Hand Complete Left  DG Hand Complete Right  Referral Orders  No referral(s) requested today    At follow up will plan to consider: initial corticosteroid injections.  If any worsening symptoms will need to discuss smoking cessation in more detail given low but possible chance of Buerger's disease  Return in about 6 weeks (around 08/28/2018).          Gerda Diss, [place] [facility] Physician

## 2018-08-28 ENCOUNTER — Ambulatory Visit: Payer: Medicare Other | Admitting: Sports Medicine

## 2018-08-28 ENCOUNTER — Encounter: Payer: Self-pay | Admitting: Sports Medicine

## 2018-08-28 VITALS — BP 112/70 | HR 61 | Ht 69.0 in | Wt 191.2 lb

## 2018-08-28 DIAGNOSIS — M180 Bilateral primary osteoarthritis of first carpometacarpal joints: Secondary | ICD-10-CM | POA: Diagnosis not present

## 2018-08-28 DIAGNOSIS — M79641 Pain in right hand: Secondary | ICD-10-CM | POA: Diagnosis not present

## 2018-08-28 DIAGNOSIS — M19041 Primary osteoarthritis, right hand: Secondary | ICD-10-CM | POA: Diagnosis not present

## 2018-08-28 DIAGNOSIS — M79642 Pain in left hand: Secondary | ICD-10-CM

## 2018-08-28 DIAGNOSIS — M19042 Primary osteoarthritis, left hand: Secondary | ICD-10-CM

## 2018-08-28 MED ORDER — DICLOFENAC SODIUM 1 % TD GEL: TRANSDERMAL | 1 refills | Status: DC

## 2018-08-28 NOTE — Progress Notes (Signed)
Samantha Snyder. [name], [place] at [facility] [phone]  Samantha Snyder - 73 y.o. female MRN [id number]  Date of birth: May 01, 1946  Visit Date: August 28, 2018  PCP: Marin Olp, MD   Referred by: Marin Olp, MD  SUBJECTIVE:  Chief Complaint  Patient presents with  . Left Hand - Follow-up    XR B hands 07/17/2018. Has tried Voltaren gel.   . Right Hand - Follow-up    HPI: Patient reports overall good improvement in her hand symptoms with the Voltaren gel.  She had a mild headache when using it 4 times per day but cutting back to 2-3 times per day has been beneficial in reducing headache that she was having.  Ultimately the swelling and range of motion has slightly improved but she is continues to have significant osteoarthritic bossing.  REVIEW OF SYSTEMS: Only minimal day-to-day functional limitations.  She does continue to garden and perform house chores without difficulty.  HISTORY:  Prior history reviewed and updated per electronic medical record.  Patient Active Problem List   Diagnosis Date Noted  . Aortic atherosclerosis ([place]) 07/08/2017  . Diabetic peripheral neuropathy ([place]) 10/27/2014    No rx. Over 6000 steps a day hurts significantly worse. 02/2018 writes me that she has seen Dr. Norman Clay at [facility] in [facility], referred to Tharon Aquas at [facility] in [place]- laser and massage. She feels improved circulation per patient but still with pain, burning, numbness   . Osteopenia 03/30/2014    Repeat 03/2018 or 03/2019.Repeat 04/05/2016- largely stable. This states -1.5 Ap spine.   Oddly appeared DEXA October 2015 lumbar spine at -2.4 Calcium 1200mg  + vit D 1000 IU. Vit D3 2000 IU.     [name] Hypertension associated with diabetes ([place]) 03/08/2014    Lisinopril. Patient denies HTN but listed by prior provider on 10/12/13.  And 01/29/13.    . DM (diabetes mellitus) type II controlled, neurological  manifestation ([place]) 02/24/2014    Metformin 1 g BID. Lisinopril 5 mg for renal protection. Lab Results  Component Value Date   HGBA1C 6.7* 10/27/2014      . Seasonal allergies 02/24/2014  . Hyperlipidemia associated with type 2 diabetes mellitus ([place]) 02/24/2014    lipitor 20mg  daily     . Tobacco abuse 02/24/2014    Attempted Quitting 2016. Rash with patch    Social History   Occupational History  . Not on file  Tobacco Use  . Smoking status: Current Every Day Smoker    Packs/day: 1.00    Years: 53.00    Pack years: 53.00    Types: Cigarettes  . Smokeless tobacco: Never Used  . Tobacco comment: Counseled that the single most powerful action she could take to decrease her risk of lung cancer is to quit smoking  Substance and Sexual Activity  . Alcohol use: No    Alcohol/week: 0.0 standard drinks  . Drug use: No  . Sexual activity: Not on file   Social History   Social History Narrative   Married 45 years in 2015.  Moved to [place] to be near daughter who lives here. Has a son as well and 47 year old grandson. 3 granddogs. 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 Used to be an algologist-studied algae under microscope      Hobbies-exercising with her husband  OBJECTIVE:  VS:  HT:5\' 9"  (175.3 cm)   WT:191 lb 3.2 oz (86.7 kg)  BMI:28.22    BP:112/70  HR:61bpm  TEMP: ( )  RESP:94 %   PHYSICAL EXAM: Adult female. No acute distress.  Alert and appropriate. Generalized osteoarthritic bossing of bilateral hands worse on the second DIP on the right.  There is no significant tense effusion but there is generalized bossing and ballottement within the joint.  She has mild flexion and extension of the DIP and slight swan-neck deformity of both hands.   ASSESSMENT:   1. Bilateral hand pain   2. Primary osteoarthritis of both hands   3. Arthritis of carpometacarpal (CMC) joint of both thumbs       PROCEDURES:  None  PLAN:  Pertinent additional documentation may be included in corresponding procedure notes, imaging studies, problem based documentation and patient instructions.  No problem-specific Assessment & Plan notes found for this encounter.  Overall she is done quite well with the Voltaren gel.  Refill for this was provided.  I am happy to refill this over the next year but if she does not have any other needs for me I am happy to defer back to Dr. Yong Channel for ongoing care of this as long as things are stable.  We did discuss that if any acute flareups we can consider intra-articular injection.  Warm water soaking and avoidance of exacerbating activities discussed including gumball retrieval in her yard that seems to cause some flareups due to the grasper mechanism she is using.  Alternatives to this were briefly discussed.       Meds ordered this encounter  Medications  . diclofenac sodium (VOLTAREN) 1 % GEL    Sig: Apply topically to affected area qid    Dispense:  100 g    Refill:  1   Lab Orders  No laboratory test(s) ordered today   Imaging Orders  No imaging studies ordered today   Referral Orders  No referral(s) requested today      No follow-ups on file.          Gerda Diss, [place] [facility] Physician

## 2018-09-05 ENCOUNTER — Other Ambulatory Visit: Payer: Self-pay | Admitting: Family Medicine

## 2018-09-17 ENCOUNTER — Inpatient Hospital Stay: Admission: RE | Admit: 2018-09-17 | Payer: Medicare Other | Source: Ambulatory Visit

## 2018-11-09 ENCOUNTER — Other Ambulatory Visit: Payer: Self-pay | Admitting: Acute Care

## 2018-11-09 DIAGNOSIS — Z122 Encounter for screening for malignant neoplasm of respiratory organs: Secondary | ICD-10-CM

## 2018-12-02 ENCOUNTER — Telehealth: Payer: Self-pay | Admitting: *Deleted

## 2018-12-02 NOTE — Telephone Encounter (Signed)
Script Screening patients for COVID-19 and reviewing new operational procedures  Greeting - The reason I am calling is to share with you some new changes to our processes that are designed to help [place] keep everyone safe. Is now a good time to speak with you? Patient says "no' - ask them when you can call back and let them know it's important to do this prior to their appointment.  Patient says "yes" - Great, [name] the first thing I need to do is ask you some screening Questions.  1. To the best of your knowledge, have you been in close contact with any one with a confirmed diagnosis of COVID 19? o No - proceed to next question  2. Have you had any one or more of the following: fever, chills, cough, shortness of breath or any flu-like symptoms? o No - proceed to next question  3. Have you been diagnosed with or have a previous diagnosis of COVID 19? o No - proceed to next question  4. I am going to go over a few other symptoms with you. Please let me know if you are experiencing any of the following: . Ear, nose or throat discomfort . A sore throat . Headache . Muscle pain . Diarrhea . Loss of taste or smell o No - proceed to next question  Thank you for answering these questions. Please know we will ask you these questions or similar questions when you arrive for your appointment and again it's how we are keeping everyone safe. Also, to keep you safe, please use the provided hand sanitizer when you enter the building. [name], we are asking everyone in the building to wear a mask because they help [place] prevent the spread of germs. Do you have a mask of your own, if not, we are happy to provide one for you. The last thing I want to go over with you is the no visitor guidelines. This means no one can attend the appointment with you unless you need physical assistance. I understand this may be different from your past appointments and I know this may be difficult but please know if someone  is driving you we are happy to call them for you once your appointment is over.  [facility] SITE SPECIFIC CHECK IN PROCEDURES]  Samantha Snyder given you a lot of information, what questions do you have about what I've talked about today or your appointment tomorrow?

## 2018-12-03 ENCOUNTER — Encounter: Payer: Self-pay | Admitting: *Deleted

## 2018-12-04 ENCOUNTER — Other Ambulatory Visit: Payer: Self-pay

## 2018-12-04 ENCOUNTER — Ambulatory Visit (INDEPENDENT_AMBULATORY_CARE_PROVIDER_SITE_OTHER)
Admission: RE | Admit: 2018-12-04 | Discharge: 2018-12-04 | Disposition: A | Discharge status: Home / Self Care | Payer: Medicare Other | Source: Ambulatory Visit | Attending: Acute Care | Admitting: Acute Care

## 2018-12-04 DIAGNOSIS — Z87891 Personal history of nicotine dependence: Secondary | ICD-10-CM

## 2018-12-04 DIAGNOSIS — Z122 Encounter for screening for malignant neoplasm of respiratory organs: Secondary | ICD-10-CM

## 2018-12-09 ENCOUNTER — Other Ambulatory Visit: Payer: Self-pay | Admitting: Acute Care

## 2018-12-09 DIAGNOSIS — Z87891 Personal history of nicotine dependence: Secondary | ICD-10-CM

## 2018-12-09 DIAGNOSIS — F17210 Nicotine dependence, cigarettes, uncomplicated: Secondary | ICD-10-CM

## 2018-12-09 DIAGNOSIS — Z122 Encounter for screening for malignant neoplasm of respiratory organs: Secondary | ICD-10-CM

## 2018-12-10 ENCOUNTER — Encounter: Payer: Self-pay | Admitting: Internal Medicine

## 2018-12-17 ENCOUNTER — Encounter: Payer: Self-pay | Admitting: Family Medicine

## 2018-12-17 NOTE — Telephone Encounter (Signed)
Dr. Yong Channel, please see message and advise if pt needs in office visit or virtual?

## 2018-12-28 ENCOUNTER — Telehealth: Payer: Self-pay | Admitting: Family Medicine

## 2018-12-28 NOTE — Telephone Encounter (Signed)
Noted thanks- will see her tomorrow

## 2018-12-28 NOTE — Telephone Encounter (Signed)
FYI Spoke to pt and she stated that this was a past fall. Pt was fell 12/17/2018. Pt was informed by Dr. Yong Channel via Mychart to give the pain time to go away on its on. Pt stated that the pain is not gone. Pt stated she just wants an Xray to rule out any fractures. Pt has been scheduled for 12/29/2018 when Teressa Senter returns.

## 2018-12-28 NOTE — Telephone Encounter (Signed)
Please advise on what would be most appropriate, in office or virtual visit for the patient. Also, needs to be triaged due to fall and side pain. Patient has not been seen since January.  Copied from [place] [phone]. Topic: Appointment Scheduling - Scheduling Inquiry for Clinic >> Dec 28, 2018 11:48 AM Percell Belt A wrote: Reason for CRM: pt called in and would like to make an appt with Dr Yong Channel for right side pain from her fall a couple weeks ago Best number [phone]

## 2018-12-29 ENCOUNTER — Other Ambulatory Visit: Payer: Self-pay

## 2018-12-29 ENCOUNTER — Encounter: Payer: Self-pay | Admitting: Family Medicine

## 2018-12-29 ENCOUNTER — Ambulatory Visit (INDEPENDENT_AMBULATORY_CARE_PROVIDER_SITE_OTHER): Payer: Medicare Other

## 2018-12-29 ENCOUNTER — Ambulatory Visit: Payer: Medicare Other | Admitting: Family Medicine

## 2018-12-29 VITALS — BP 120/68 | HR 69 | Temp 98.6°F | Ht 69.0 in | Wt 187.6 lb

## 2018-12-29 DIAGNOSIS — E1142 Type 2 diabetes mellitus with diabetic polyneuropathy: Secondary | ICD-10-CM | POA: Diagnosis not present

## 2018-12-29 DIAGNOSIS — M25561 Pain in right knee: Secondary | ICD-10-CM

## 2018-12-29 DIAGNOSIS — R0781 Pleurodynia: Secondary | ICD-10-CM

## 2018-12-29 DIAGNOSIS — I700 Atherosclerosis of aorta: Secondary | ICD-10-CM

## 2018-12-29 DIAGNOSIS — E1169 Type 2 diabetes mellitus with other specified complication: Secondary | ICD-10-CM | POA: Diagnosis not present

## 2018-12-29 DIAGNOSIS — E1159 Type 2 diabetes mellitus with other circulatory complications: Secondary | ICD-10-CM | POA: Diagnosis not present

## 2018-12-29 DIAGNOSIS — I152 Hypertension secondary to endocrine disorders: Secondary | ICD-10-CM

## 2018-12-29 DIAGNOSIS — E785 Hyperlipidemia, unspecified: Secondary | ICD-10-CM

## 2018-12-29 DIAGNOSIS — I10 Essential (primary) hypertension: Secondary | ICD-10-CM

## 2018-12-29 LAB — COMPREHENSIVE METABOLIC PANEL
ALT: 15 U/L (ref 0–35)
AST: 11 U/L (ref 0–37)
Albumin: 4.3 g/dL (ref 3.5–5.2)
Alkaline Phosphatase: 113 U/L (ref 39–117)
BUN: 21 mg/dL (ref 6–23)
CO2: 24 mEq/L (ref 19–32)
Calcium: 9.9 mg/dL (ref 8.4–10.5)
Chloride: 106 mEq/L (ref 96–112)
Creatinine, Ser: 0.62 mg/dL (ref 0.40–1.20)
GFR: 94.78 mL/min (ref >60.00)
Glucose, Bld: 109 mg/dL — ABNORMAL HIGH (ref 70–99)
Potassium: 4.1 mEq/L (ref 3.5–5.1)
Sodium: 141 mEq/L (ref 135–145)
Total Bilirubin: 0.5 mg/dL (ref 0.2–1.2)
Total Protein: 6.7 g/dL (ref 6.0–8.3)

## 2018-12-29 LAB — HEMOGLOBIN A1C: Hgb A1c MFr Bld: 6.8 % — ABNORMAL HIGH (ref 4.6–6.5)

## 2018-12-29 MED ORDER — DICLOFENAC SODIUM 1 % TD GEL: TRANSDERMAL | 1 refills | Status: DC

## 2018-12-29 NOTE — Patient Instructions (Addendum)
Health Maintenance Due  Topic Date Due  . COLONOSCOPY  Pt has an appt. July 13th 2020  12/21/2018   Please stop by lab and x-ray before you go If you do not have mychart- we will call you about results within 5 business days of [place] receiving them.  If you have mychart- we will send your results within 3 business days of [place] receiving them.  If abnormal or we want to clarify a result, we will call or mychart you to make sure you receive the message.  If you have questions or concerns or don't hear within 5-7 days, please send [place] a message or call us.   As always- would love to have you quit smoking! :) one of the best things you can do for your helath

## 2018-12-29 NOTE — Progress Notes (Signed)
Phone [phone]   Subjective:  Samantha Snyder is a 73 y.o. year old very pleasant female patient who presents for/with See problem oriented charting Chief Complaint  Patient presents with  . Fall    Pt had a fall last month. Pt fell on Right side of body. Pt claims she has minor pain with some soreness. Pt is wanting an X-ray to rule out fracture.    ROS- No chest pain or shortness of breath. No headache or blurry vision. No low blood sugars   Past Medical History-  Patient Active Problem List   Diagnosis Date Noted  . DM (diabetes mellitus) type II controlled, neurological manifestation ([place]) 02/24/2014    Priority: High  . Tobacco abuse 02/24/2014    Priority: High  . Osteopenia 03/30/2014    Priority: Medium  . Hypertension associated with diabetes ([place]) 03/08/2014    Priority: Medium  . Hyperlipidemia associated with type 2 diabetes mellitus ([place]) 02/24/2014    Priority: Medium  . Diabetic peripheral neuropathy ([place]) 10/27/2014    Priority: Low  . Seasonal allergies 02/24/2014    Priority: Low  . Aortic atherosclerosis ([place]) 07/08/2017    Medications- reviewed and updated Current Outpatient Medications  Medication Sig Dispense Refill  . ACCU-CHEK FASTCLIX LANCETS MISC USE TO CHECK BLOOD SUGARS  3-4 TIMES DAILY. E11.4 408 each 11  . aspirin 81 MG tablet Take 81 mg by mouth daily.    [name] atorvastatin (LIPITOR) 20 MG tablet TAKE 1 TABLET BY MOUTH  DAILY 90 tablet 2  . Blood Glucose Monitoring Suppl (ACCU-CHEK NANO SMARTVIEW) w/Device KIT Use to check blood sugars 3-4 times daily 1 kit 0  . Calcium Carbonate-Vit D-Min (CALCIUM 1200 PO) Take 2 tablets by mouth.    . Cholecalciferol (VITAMIN D3) 2000 UNITS TABS Take 1 tablet by mouth.    . diclofenac sodium (VOLTAREN) 1 % GEL Apply topically to affected area qid 100 g 1  . fexofenadine (ALLEGRA) 180 MG tablet Take 180 mg by mouth daily.    [name] glucose blood (ACCU-CHEK SMARTVIEW) test strip USE TO TEST BLOOD SUGARS  3-4 TIMES  DAILY. E11.9 400 each 11  . ibuprofen (ADVIL,MOTRIN) 400 MG tablet Take 400 mg by mouth at bedtime as needed. Reported on 09/04/2015    . lisinopril (PRINIVIL,ZESTRIL) 5 MG tablet TAKE 1 TABLET BY MOUTH  DAILY 90 tablet 2  . Magnesium 500 MG CAPS Take 1 capsule by mouth.    . metFORMIN (GLUCOPHAGE) 1000 MG tablet TAKE 1 TABLET BY MOUTH TWO  TIMES DAILY WITH A MEAL 180 tablet 3  . Multiple Vitamins-Minerals (MULTIVITAMIN PO) Take 1 tablet by mouth.    . Omega-3 Fatty Acids (FISH OIL PO) Take 2 tablets by mouth 2 (two) times daily. 1,000 mg    . OVER THE COUNTER MEDICATION Herbal tea 8 ounces daily    . Probiotic Product (PROBIOTIC DAILY PO) Take 1 tablet by mouth.    . Wheat Dextrin (BENEFIBER PO) Take 1 tablet by mouth.     No current facility-administered medications for this visit.      Objective:  BP 120/68   Pulse 69   Temp 98.6 F (37 C) (Oral)   Ht 5' 9"  (1.753 m)   Wt 187 lb 9.6 oz (85.1 kg)   LMP  (LMP Unknown)   SpO2 96%   BMI 27.70 kg/m  Gen: NAD, resting comfortably CV: RRR no murmurs rubs or gallops No obvious bruising over right chest wall- no pain with palpation along right-sided  rib cage Lungs: CTAB no crackles, wheeze, rhonchi Abdomen: soft/nontender/nondistended/normal bowel sounds. Ext: no edema Skin: warm, dry  Right knee: nspection with no erythema  or obvious bony abnormalities.  Does have mild to moderate effusion. Palpation without warmth but does have medial and lateral joint line tenderness.  Minimal tenderness to palpation of her patella.  ROM normal in flexion and extension and lower leg rotation. Ligaments with solid consistent endpoints including ACL, PCL, LCL, MCL.      Assessment and Plan   Other notes: 1.still smoking 1 ppd- has not pursued hypnotism. She is trying  to cut back. Doesn't do well on patch due to rash or with lozenges. Goal this visit was 10 cigarettes per day- covd 19 has made that hard.  2. 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 Scheduled  For Gi consult July 13th 2020  # Fall and rib pain on right and right knee pain S:Patient had an electrician come work on Therapist, nutritional switch went off- he needed a flashlight and husband needed a flashlight to get to breaker box. She fell trying to step over a box of ginger ale and foot clipped box and fell onto concrete on garage. Minor pain on right knee but really hit right sided rib cage. Hit really hard.   Pain with deep breaths- felt like had bruised ribs. Fall was 4 weeks ago- wants to see if has fractures. She took fair amount of ibuprofen and that helped at beginning- did a lot of icing. Pain has improved steadily  Didn't hit head or have LOC.   Right knee does continue to bother her with mild to moderate pain particularly with palpation  A/P: I do not think patient needs x-ray of her ribs-we discussed even if she did have a prior fracture-likely well in the healing phase and would not require intervention.  She has no increased shortness of breath from baseline.  For right knee pain-she would like to get an x-ray-we discussed this would not necessarily pick up ligamentous or meniscal damage-if she has ongoing or worsening issues-could refer to sports medicine for further evaluation-she will let us know if she wants to do this.  #hypertension S: controlled on lisinopril 5 mg BP Readings from Last 3 Encounters:  12/29/18 120/68  08/28/18 112/70  07/17/18 106/72  A/P:  Stable. Continue current medications.   #hyperlipidemia/aortic atherosclerosis S:  controlled on atorvastatin 27m.  Risk factors modified by controlling blood pressure and cholesterol for aortic atherosclerosis Lab Results  Component Value Date   CHOL 128 03/12/2018   HDL 41.20 03/12/2018   LDLCALC 49 03/12/2018   LDLDIRECT 73.0 03/11/2017   TRIG 190.0 (H) 03/12/2018   CHOLHDL 3 03/12/2018   A/P:  Stable x2. Continue current medications.   # Diabetes  S: well controlled on last check on metformin 1g BID Exercise and diet-  she has lost another 5 lbs from last visit! Husband has lost a lot of weight Lab Results  Component Value Date   HGBA1C 6.4 (A) 07/14/2018   HGBA1C 7.3 (H) 03/12/2018   HGBA1C 7.0 (H) 11/06/2017   A/P:  hopefully stable- update a1c - history of neuropathy- uses diabetic shoes to prevent ulceration -still doing laser treatment on feet for neuropathy and finds it helps a lot  Future Appointments  Date Time Provider [place] 01/07/2019 10:30 AM [facility] PREVISIT RM50 [facility]  01/21/2019  9:30 AM Pyrtle, JLajuan Lines MD [facility]  03/15/2019  8:20 AM Marin Olp, MD [facility]   Lab/Order associations:   ICD-10-CM   1. Acute pain of right knee  M25.561 DG Knee Complete 4 Views Right  2. Controlled type 2 diabetes mellitus with diabetic polyneuropathy, without long-term current use of insulin (H[facility])  E11.42 Comprehensive metabolic panel    Hemoglobin A1c  3. Hyperlipidemia associated with type 2 diabetes mellitus ([place])  E11.69    E78.5   4. Hypertension associated with diabetes ([place])  E11.59    I10   5. Aortic atherosclerosis ([facility]) Chronic I70.0   6. Rib pain on right side  R07.81 CANCELED: DG Ribs Unilateral W/Chest Right    Meds ordered this encounter  Medications  . diclofenac sodium (VOLTAREN) 1 % GEL    Sig: Apply topically to affected area qid    Dispense:  100 g    Refill:  1    Return precautions advised.  Garret Reddish, MD

## 2019-01-07 ENCOUNTER — Ambulatory Visit: Payer: Medicare Other | Admitting: *Deleted

## 2019-01-07 ENCOUNTER — Other Ambulatory Visit: Payer: Self-pay

## 2019-01-07 VITALS — Ht 69.0 in | Wt 185.0 lb

## 2019-01-07 DIAGNOSIS — Z86010 Personal history of colonic polyps: Secondary | ICD-10-CM

## 2019-01-07 MED ORDER — SUPREP BOWEL PREP KIT 17.5-3.13-1.6 GM/177ML PO SOLN: 1.0000 | Freq: Once | ORAL | 0 refills | Status: AC

## 2019-01-07 NOTE — Progress Notes (Signed)
No egg or soy allergy known to patient  No issues with past sedation with any surgeries  or procedures, no intubation problems  No diet pills per patient No home 02 use per patient  No blood thinners per patient  Pt denies issues with constipation  No A fib or A flutter  EMMI video sent to pt's e mail   Pt verified name, DOB, address and insurance during PV today. Pt mailed instruction packet to included paper to complete and mail back to [facility] with addressed and stamped envelope, Emmi video, copy of consent form to read and not return, and instructions.PV completed over the phone. Pt encouraged to call with questions or issues   Pt is aware that care partner will wait in the car during procedure; if they feel like they will be too hot to wait in the car; they may wait in the lobby.  We want them to wear a mask (we do not have any that we can provide them), practice social distancing, and we will check their temperatures when they get here.  I did remind patient that their care partner needs to stay in the parking lot the entire time. Pt will wear mask into building.  

## 2019-01-12 ENCOUNTER — Telehealth: Payer: Self-pay | Admitting: Internal Medicine

## 2019-01-12 NOTE — Telephone Encounter (Signed)
Called pt- [facility] Samantha Snyder

## 2019-01-12 NOTE — Telephone Encounter (Signed)
Questions about the foods to restrict 5 days before- Can she eat rice crispies with Milk these 5 days before- Instructed yes, is ok. She is asking about herbal teas and instructed ok for these- asked about cheese and cottage cheese the same 5 days with fish and tuna fish - also good the 5 days before- can she have canned veggies not on the list- Yes she can like carrots, potatoes, asparagus- She asked if butter is ok- instructed ok - also asked about eggs- yes is ok-   The day before JUST clear liquids , orange popsicles, jello no red or purple- broths- and all the other clears on the instruction packet   All questions answered today-- Samantha Snyder

## 2019-01-12 NOTE — Telephone Encounter (Signed)
Pt requested a call to discuss prep instructions.

## 2019-01-18 ENCOUNTER — Telehealth: Payer: Self-pay | Admitting: Internal Medicine

## 2019-01-18 NOTE — Telephone Encounter (Signed)
Patient wants to inform us that she had a fall on 7/17 and injured her Right knee and does not want it to interfere with her colonoscopy

## 2019-01-20 ENCOUNTER — Telehealth: Payer: Self-pay | Admitting: Internal Medicine

## 2019-01-20 NOTE — Telephone Encounter (Signed)

## 2019-01-21 ENCOUNTER — Other Ambulatory Visit: Payer: Self-pay

## 2019-01-21 ENCOUNTER — Ambulatory Visit (AMBULATORY_SURGERY_CENTER): Payer: Medicare Other | Admitting: Internal Medicine

## 2019-01-21 ENCOUNTER — Encounter: Payer: Self-pay | Admitting: Internal Medicine

## 2019-01-21 VITALS — BP 107/77 | HR 88 | Temp 98.3°F | Resp 14 | Ht 69.0 in | Wt 185.0 lb

## 2019-01-21 DIAGNOSIS — D120 Benign neoplasm of cecum: Secondary | ICD-10-CM

## 2019-01-21 DIAGNOSIS — D123 Benign neoplasm of transverse colon: Secondary | ICD-10-CM

## 2019-01-21 DIAGNOSIS — Z86010 Personal history of colonic polyps: Secondary | ICD-10-CM

## 2019-01-21 MED ORDER — SODIUM CHLORIDE 0.9 % IV SOLN: 500.0000 mL | INTRAVENOUS | Status: DC

## 2019-01-21 NOTE — Addendum Note (Signed)
Addended by: Ernestine Conrad D on: 01/21/2019 02:48 PM   Modules accepted: Orders

## 2019-01-21 NOTE — Progress Notes (Signed)
Called to room to assist during endoscopic procedure.  Patient ID and intended procedure confirmed with present staff. Received instructions for my participation in the procedure from the performing physician.  

## 2019-01-21 NOTE — Progress Notes (Signed)
Temp by [place]  Vitals by CW

## 2019-01-21 NOTE — Progress Notes (Signed)
Report given to PACU, vss 

## 2019-01-21 NOTE — Patient Instructions (Signed)
Read all of the handouts given to you by your recovery room nurse.  YOU HAD AN ENDOSCOPIC PROCEDURE TODAY AT [facility]:   Refer to the procedure report that was given to you for any specific questions about what was found during the examination.  If the procedure report does not answer your questions, please call your gastroenterologist to clarify.  If you requested that your care partner not be given the details of your procedure findings, then the procedure report has been included in a sealed envelope for you to review at your convenience later.  YOU SHOULD EXPECT: Some feelings of bloating in the abdomen. Passage of more gas than usual.  Walking can help get rid of the air that was put into your GI tract during the procedure and reduce the bloating. If you had a lower endoscopy (such as a colonoscopy or flexible sigmoidoscopy) you may notice spotting of blood in your stool or on the toilet paper. If you underwent a bowel prep for your procedure, you may not have a normal bowel movement for a few days.  Please Note:  You might notice some irritation and congestion in your nose or some drainage.  This is from the oxygen used during your procedure.  There is no need for concern and it should clear up in a day or so.  SYMPTOMS TO REPORT IMMEDIATELY:   Following lower endoscopy (colonoscopy or flexible sigmoidoscopy):  Excessive amounts of blood in the stool  Significant tenderness or worsening of abdominal pains  Swelling of the abdomen that is new, acute  Fever of 100F or higher   Black, tarry-looking stools  For urgent or emergent issues, a gastroenterologist can be reached at any hour by calling [phone].   DIET:  We do recommend a small meal at first, but then you may proceed to your regular diet.  Drink plenty of fluids but you should avoid alcoholic beverages for 24 hours. Try to increase the fiber in your diet, and drink plenty of water. ACTIVITY:  You should  plan to take it easy for the rest of today and you should NOT DRIVE or use heavy machinery until tomorrow (because of the sedation medicines used during the test).    FOLLOW UP: Our staff will call the number listed on your records 48-72 hours following your procedure to check on you and address any questions or concerns that you may have regarding the information given to you following your procedure. If we do not reach you, we will leave a message.  We will attempt to reach you two times.  During this call, we will ask if you have developed any symptoms of COVID 19. If you develop any symptoms (ie: fever, flu-like symptoms, shortness of breath, cough etc.) before then, please call [phone].  If you test positive for Covid 19 in the 2 weeks post procedure, please call and report this information to [place].    If any biopsies were taken you will be contacted by phone or by letter within the next 1-3 weeks.  Please call us at [phone] if you have not heard about the biopsies in 3 weeks.    SIGNATURES/CONFIDENTIALITY: You and/or your care partner have signed paperwork which will be entered into your electronic medical record.  These signatures attest to the fact that that the information above on your After Visit Summary has been reviewed and is understood.  Full responsibility of the confidentiality of this discharge information lies with you  and/or your care-partner. 

## 2019-01-21 NOTE — Op Note (Signed)
[place] Patient Name: Samantha Snyder Procedure Date: 01/21/2019 9:13 AM MRN: [id number] Endoscopist: Jerene Bears , MD Age: 73 Referring MD:  Date of Birth: 12/28/1945 Gender: Female Account #: [id number] Procedure:                Colonoscopy Indications:              High risk colon cancer surveillance: Personal                            history of multiple (3 or more) adenomas, Last                            colonoscopy 3 years ago Medicines:                Monitored Anesthesia Care Procedure:                Pre-Anesthesia Assessment:                           - Prior to the procedure, a History and Physical                            was performed, and patient medications and                            allergies were reviewed. The patient's tolerance of                            previous anesthesia was also reviewed. The risks                            and benefits of the procedure and the sedation                            options and risks were discussed with the patient.                            All questions were answered, and informed consent                            was obtained. Prior Anticoagulants: The patient has                            taken no previous anticoagulant or antiplatelet                            agents. ASA Grade Assessment: III - A patient with                            severe systemic disease. After reviewing the risks                            and benefits, the patient was deemed in  satisfactory condition to undergo the procedure.                           After obtaining informed consent, the colonoscope                            was passed under direct vision. Throughout the                            procedure, the patient's blood pressure, pulse, and                            oxygen saturations were monitored continuously. The                            Colonoscope was introduced through the anus  and                            advanced to the cecum, identified by appendiceal                            orifice and ileocecal valve. The colonoscopy was                            performed without difficulty. The patient tolerated                            the procedure well. The quality of the bowel                            preparation was good. The ileocecal valve,                            appendiceal orifice, and rectum were photographed.                            The bowel preparation used was 2 day MiraLax +                            Suprep via split dose instruction. Scope In: 9:20:48 AM Scope Out: 9:40:39 AM Scope Withdrawal Time: 0 hours 14 minutes 53 seconds  Total Procedure Duration: 0 hours 19 minutes 51 seconds  Findings:                 The digital rectal exam was normal.                           A 3 mm polyp was found in the ileocecal valve. The                            polyp was sessile. The polyp was removed with a                            cold snare. Resection and retrieval  were complete.                           Two sessile polyps were found in the transverse                            colon. The polyps were 4 to 5 mm in size. These                            polyps were removed with a cold snare. Resection                            and retrieval were complete.                           Multiple small and large-mouthed diverticula were                            found from ascending colon to sigmoid colon.                           The retroflexed view of the distal rectum and anal                            verge was normal and showed no anal or rectal                            abnormalities. Complications:            No immediate complications. Estimated Blood Loss:     Estimated blood loss was minimal. Impression:               - One 3 mm polyp at the ileocecal valve, removed                            with a cold snare. Resected and retrieved.                            - Two 4 to 5 mm polyps in the transverse colon,                            removed with a cold snare. Resected and retrieved.                           - Diverticulosis from ascending colon to sigmoid                            colon.                           - The distal rectum and anal verge are normal on                            retroflexion view. Recommendation:           -  Patient has a contact number available for                            emergencies. The signs and symptoms of potential                            delayed complications were discussed with the                            patient. Return to normal activities tomorrow.                            Written discharge instructions were provided to the                            patient.                           - Resume previous diet.                           - Continue present medications.                           - Await pathology results.                           - Repeat colonoscopy is recommended for                            surveillance. The colonoscopy date will be                            determined after pathology results from today's                            exam become available for review. Jerene Bears, MD 01/21/2019 9:44:41 AM This report has been signed electronically.

## 2019-01-25 ENCOUNTER — Encounter: Payer: Self-pay | Admitting: Internal Medicine

## 2019-01-25 ENCOUNTER — Telehealth: Payer: Self-pay

## 2019-01-25 NOTE — Telephone Encounter (Signed)
  Follow up Call-  Call back number 01/21/2019  Post procedure Call Back phone  # [phone]  Permission to leave phone message Yes  Some recent data might be hidden     Patient questions:  Do you have a fever, pain , or abdominal swelling? No. Pain Score  0 *  Have you tolerated food without any problems? Yes.    Have you been able to return to your normal activities? Yes.    Do you have any questions about your discharge instructions: Diet   No. Medications  No. Follow up visit  No.  Do you have questions or concerns about your Care? No.  Actions: * If pain score is 4 or above: No action needed, pain <4. 1. Have you developed a fever since your procedure? no  2.   Have you had an respiratory symptoms (SOB or cough) since your procedure? no  3.   Have you tested positive for COVID 19 since your procedure no  4.   Have you had any family members/close contacts diagnosed with the COVID 19 since your procedure?  no   If yes to any of these questions please route to Joylene John, RN and Alphonsa Gin, Therapist, sports.

## 2019-02-22 ENCOUNTER — Other Ambulatory Visit: Payer: Self-pay | Admitting: Family Medicine

## 2019-03-02 ENCOUNTER — Other Ambulatory Visit: Payer: Self-pay | Admitting: Family Medicine

## 2019-03-02 DIAGNOSIS — Z1231 Encounter for screening mammogram for malignant neoplasm of breast: Secondary | ICD-10-CM

## 2019-03-10 ENCOUNTER — Encounter: Payer: Self-pay | Admitting: Family Medicine

## 2019-03-15 ENCOUNTER — Ambulatory Visit (INDEPENDENT_AMBULATORY_CARE_PROVIDER_SITE_OTHER): Payer: Medicare Other | Admitting: Family Medicine

## 2019-03-15 ENCOUNTER — Encounter: Payer: Self-pay | Admitting: Family Medicine

## 2019-03-15 ENCOUNTER — Ambulatory Visit (INDEPENDENT_AMBULATORY_CARE_PROVIDER_SITE_OTHER): Payer: Medicare Other

## 2019-03-15 ENCOUNTER — Other Ambulatory Visit: Payer: Self-pay

## 2019-03-15 ENCOUNTER — Ambulatory Visit: Payer: Medicare Other

## 2019-03-15 VITALS — BP 118/80 | HR 73 | Temp 97.7°F | Ht 69.0 in | Wt 186.4 lb

## 2019-03-15 VITALS — BP 118/80 | Temp 97.7°F | Ht 69.0 in | Wt 186.4 lb

## 2019-03-15 DIAGNOSIS — I700 Atherosclerosis of aorta: Secondary | ICD-10-CM

## 2019-03-15 DIAGNOSIS — Z0000 Encounter for general adult medical examination without abnormal findings: Secondary | ICD-10-CM

## 2019-03-15 DIAGNOSIS — I10 Essential (primary) hypertension: Secondary | ICD-10-CM

## 2019-03-15 DIAGNOSIS — Z720 Tobacco use: Secondary | ICD-10-CM

## 2019-03-15 DIAGNOSIS — Z23 Encounter for immunization: Secondary | ICD-10-CM

## 2019-03-15 DIAGNOSIS — E1159 Type 2 diabetes mellitus with other circulatory complications: Secondary | ICD-10-CM | POA: Diagnosis not present

## 2019-03-15 DIAGNOSIS — E1169 Type 2 diabetes mellitus with other specified complication: Secondary | ICD-10-CM | POA: Diagnosis not present

## 2019-03-15 DIAGNOSIS — E1142 Type 2 diabetes mellitus with diabetic polyneuropathy: Secondary | ICD-10-CM | POA: Diagnosis not present

## 2019-03-15 DIAGNOSIS — I152 Hypertension secondary to endocrine disorders: Secondary | ICD-10-CM

## 2019-03-15 DIAGNOSIS — M8588 Other specified disorders of bone density and structure, other site: Secondary | ICD-10-CM

## 2019-03-15 DIAGNOSIS — E785 Hyperlipidemia, unspecified: Secondary | ICD-10-CM

## 2019-03-15 LAB — COMPREHENSIVE METABOLIC PANEL
ALT: 16 U/L (ref 0–35)
AST: 14 U/L (ref 0–37)
Albumin: 4.1 g/dL (ref 3.5–5.2)
Alkaline Phosphatase: 104 U/L (ref 39–117)
BUN: 20 mg/dL (ref 6–23)
CO2: 24 mEq/L (ref 19–32)
Calcium: 9.6 mg/dL (ref 8.4–10.5)
Chloride: 108 mEq/L (ref 96–112)
Creatinine, Ser: 0.53 mg/dL (ref 0.40–1.20)
GFR: 113.52 mL/min (ref >60.00)
Glucose, Bld: 145 mg/dL — ABNORMAL HIGH (ref 70–99)
Potassium: 4.3 mEq/L (ref 3.5–5.1)
Sodium: 141 mEq/L (ref 135–145)
Total Bilirubin: 0.5 mg/dL (ref 0.2–1.2)
Total Protein: 6.3 g/dL (ref 6.0–8.3)

## 2019-03-15 LAB — LIPID PANEL
Cholesterol: 130 mg/dL (ref 0–200)
HDL: 40.4 mg/dL (ref >39.00)
LDL Cholesterol: 53 mg/dL (ref 0–99)
NonHDL: 89.7
Total CHOL/HDL Ratio: 3
Triglycerides: 182 mg/dL — ABNORMAL HIGH (ref 0.0–149.0)
VLDL: 36.4 mg/dL (ref 0.0–40.0)

## 2019-03-15 LAB — POC URINALSYSI DIPSTICK (AUTOMATED)
Bilirubin, UA: NEGATIVE
Blood, UA: NEGATIVE
Glucose, UA: NEGATIVE
Ketones, UA: NEGATIVE
Nitrite, UA: NEGATIVE
Protein, UA: NEGATIVE
Spec Grav, UA: 1.025 (ref 1.010–1.025)
Urobilinogen, UA: 0.2 E.U./dL
pH, UA: 6 (ref 5.0–8.0)

## 2019-03-15 LAB — CBC
HCT: 44.4 % (ref 36.0–46.0)
Hemoglobin: 14.7 g/dL (ref 12.0–15.0)
MCHC: 33.1 g/dL (ref 30.0–36.0)
MCV: 90.6 fl (ref 78.0–100.0)
Platelets: 365 10*3/uL (ref 150.0–400.0)
RBC: 4.9 Mil/uL (ref 3.87–5.11)
RDW: 14.9 % (ref 11.5–15.5)
WBC: 10.2 10*3/uL (ref 4.0–10.5)

## 2019-03-15 NOTE — Patient Instructions (Addendum)
Health Maintenance Due  Topic Date Due  . INFLUENZA VACCINE -today 01/23/2019    Please stop by lab before you go If you do not have mychart- we will call you about results within 5 business days of [place] receiving them.  If you have mychart- we will send your results within 3 business days of [place] receiving them.  If abnormal or we want to clarify a result, we will call or mychart you to make sure you receive the message.  If you have questions or concerns or don't hear within 5-7 days, please send [place] a message or call us.

## 2019-03-15 NOTE — Patient Instructions (Signed)
Samantha Snyder , Thank you for taking time to come for your Medicare Wellness Visit. I appreciate your ongoing commitment to your health goals. Please review the following plan we discussed and let me know if I can assist you in the future.   Screening recommendations/referrals: Colorectal Screening: completed last 01/21/19 with Dr. Hilarie Fredrickson  Mammogram: scheduled  Bone Density: up to date; last 04/13/18  Vision and Dental Exams: Recommended annual ophthalmology exams for early detection of glaucoma and other disorders of the eye Recommended annual dental exams for proper oral hygiene  Diabetic Exams: Diabetic Eye Exam: up to date; last with [facility]  Diabetic Foot Exam: up to date  Vaccinations: Influenza vaccine: today  Pneumococcal vaccine: up to date; last 02/24/14 Tdap vaccine: up to date; last 03/07/16 Shingles vaccine: Shingrix completed   Advanced directives: We have received a copy of your POA (Power of [place]) and/or Living Will. These documents can be located in your chart.  Goals: Recommend to continue efforts to reduce smoking habits until no longer smoking. Smoking Cessation literature is attached below.  Next appointment: Please schedule your Annual Wellness Visit with your Nurse Health Advisor in one year.  Preventive Care 71 Years and Older, Female Preventive care refers to lifestyle choices and visits with your health care provider that can promote health and wellness. What does preventive care include?  A yearly physical exam. This is also called an annual well check.  Dental exams once or twice a year.  Routine eye exams. Ask your health care provider how often you should have your eyes checked.  Personal lifestyle choices, including:  Daily care of your teeth and gums.  Regular physical activity.  Eating a healthy diet.  Avoiding tobacco and drug use.  Limiting alcohol use.  Practicing safe sex.  Taking low-dose aspirin every day if  recommended by your health care provider.  Taking vitamin and mineral supplements as recommended by your health care provider. What happens during an annual well check? The services and screenings done by your health care provider during your annual well check will depend on your age, overall health, lifestyle risk factors, and family history of disease. Counseling  Your health care provider may ask you questions about your:  Alcohol use.  Tobacco use.  Drug use.  Emotional well-being.  Home and relationship well-being.  Sexual activity.  Eating habits.  History of falls.  Memory and ability to understand (cognition).  Work and work Statistician.  Reproductive health. Screening  You may have the following tests or measurements:  Height, weight, and BMI.  Blood pressure.  Lipid and cholesterol levels. These may be checked every 5 years, or more frequently if you are over 34 years old.  Skin check.  Lung cancer screening. You may have this screening every year starting at age 45 if you have a 30-pack-year history of smoking and currently smoke or have quit within the past 15 years.  Fecal occult blood test (FOBT) of the stool. You may have this test every year starting at age 38.  Flexible sigmoidoscopy or colonoscopy. You may have a sigmoidoscopy every 5 years or a colonoscopy every 10 years starting at age 64.  Hepatitis C blood test.  Hepatitis B blood test.  Sexually transmitted disease (STD) testing.  Diabetes screening. This is done by checking your blood sugar (glucose) after you have not eaten for a while (fasting). You may have this done every 1-3 years.  Bone density scan. This is done to  screen for osteoporosis. You may have this done starting at age 32.  Mammogram. This may be done every 1-2 years. Talk to your health care provider about how often you should have regular mammograms. Talk with your health care provider about your test results,  treatment options, and if necessary, the need for more tests. Vaccines  Your health care provider may recommend certain vaccines, such as:  Influenza vaccine. This is recommended every year.  Tetanus, diphtheria, and acellular pertussis (Tdap, Td) vaccine. You may need a Td booster every 10 years.  Zoster vaccine. You may need this after age 59.  Pneumococcal 13-valent conjugate (PCV13) vaccine. One dose is recommended after age 59.  Pneumococcal polysaccharide (PPSV23) vaccine. One dose is recommended after age 52. Talk to your health care provider about which screenings and vaccines you need and how often you need them. This information is not intended to replace advice given to you by your health care provider. Make sure you discuss any questions you have with your health care provider. Document Released: 07/07/2015 Document Revised: 02/28/2016 Document Reviewed: 04/11/2015 Elsevier Interactive Patient Education  2017 [employer] Prevention in the Home Falls can cause injuries. They can happen to people of all ages. There are many things you can do to make your home safe and to help prevent falls. What can I do on the outside of my home?  Regularly fix the edges of walkways and driveways and fix any cracks.  Remove anything that might make you trip as you walk through a door, such as a raised step or threshold.  Trim any bushes or trees on the path to your home.  Use bright outdoor lighting.  Clear any walking paths of anything that might make someone trip, such as rocks or tools.  Regularly check to see if handrails are loose or broken. Make sure that both sides of any steps have handrails.  Any raised decks and porches should have guardrails on the edges.  Have any leaves, snow, or ice cleared regularly.  Use sand or salt on walking paths during winter.  Clean up any spills in your garage right away. This includes oil or grease spills. What can I do in the  bathroom?  Use night lights.  Install grab bars by the toilet and in the tub and shower. Do not use towel bars as grab bars.  Use non-skid mats or decals in the tub or shower.  If you need to sit down in the shower, use a plastic, non-slip stool.  Keep the floor dry. Clean up any water that spills on the floor as soon as it happens.  Remove soap buildup in the tub or shower regularly.  Attach bath mats securely with double-sided non-slip rug tape.  Do not have throw rugs and other things on the floor that can make you trip. What can I do in the bedroom?  Use night lights.  Make sure that you have a light by your bed that is easy to reach.  Do not use any sheets or blankets that are too big for your bed. They should not hang down onto the floor.  Have a firm chair that has side arms. You can use this for support while you get dressed.  Do not have throw rugs and other things on the floor that can make you trip. What can I do in the kitchen?  Clean up any spills right away.  Avoid walking on wet floors.  Keep items that  you use a lot in easy-to-reach places.  If you need to reach something above you, use a strong step stool that has a grab bar.  Keep electrical cords out of the way.  Do not use floor polish or wax that makes floors slippery. If you must use wax, use non-skid floor wax.  Do not have throw rugs and other things on the floor that can make you trip. What can I do with my stairs?  Do not leave any items on the stairs.  Make sure that there are handrails on both sides of the stairs and use them. Fix handrails that are broken or loose. Make sure that handrails are as long as the stairways.  Check any carpeting to make sure that it is firmly attached to the stairs. Fix any carpet that is loose or worn.  Avoid having throw rugs at the top or bottom of the stairs. If you do have throw rugs, attach them to the floor with carpet tape.  Make sure that you have a  light switch at the top of the stairs and the bottom of the stairs. If you do not have them, ask someone to add them for you. What else can I do to help prevent falls?  Wear shoes that:  Do not have high heels.  Have rubber bottoms.  Are comfortable and fit you well.  Are closed at the toe. Do not wear sandals.  If you use a stepladder:  Make sure that it is fully opened. Do not climb a closed stepladder.  Make sure that both sides of the stepladder are locked into place.  Ask someone to hold it for you, if possible.  Clearly mark and make sure that you can see:  Any grab bars or handrails.  First and last steps.  Where the edge of each step is.  Use tools that help you move around (mobility aids) if they are needed. These include:  Canes.  Walkers.  Scooters.  Crutches.  Turn on the lights when you go into a dark area. Replace any light bulbs as soon as they burn out.  Set up your furniture so you have a clear path. Avoid moving your furniture around.  If any of your floors are uneven, fix them.  If there are any pets around you, be aware of where they are.  Review your medicines with your doctor. Some medicines can make you feel dizzy. This can increase your chance of falling. Ask your doctor what other things that you can do to help prevent falls. This information is not intended to replace advice given to you by your health care provider. Make sure you discuss any questions you have with your health care provider. Document Released: 04/06/2009 Document Revised: 11/16/2015 Document Reviewed: 07/15/2014 Elsevier Interactive Patient Education  2017 [employer].

## 2019-03-15 NOTE — Progress Notes (Signed)
Subjective:   Samantha Snyder is a 73 y.o. female who presents for Medicare Annual (Subsequent) preventive examination.  Review of Systems:   Cardiac Risk Factors include: advanced age (>47mn, >>1women);smoking/ tobacco exposure;hypertension;dyslipidemia     Objective:     Vitals: BP 118/80   Temp 97.7 F (36.5 C)   Ht _0  (1.753 m)   Wt 186 lb 6.4 oz (84.6 kg)   LMP  (LMP Unknown)   BMI 27.53 kg/m   Body mass index is 27.53 kg/m.  Advanced Directives 03/15/2019 03/12/2018 03/11/2017 12/21/2015 12/07/2015 11/01/2015  Does Patient Have a Medical Advance Directive? _1  Yes  Type of Advance Directive Living will;Healthcare Power of [place]  Does patient want to make changes to medical advance directive? No - Patient declined - - - - -  Copy of [place] Chart? Yes - validated most recent copy scanned in chart (See row information) - - No - copy requested - -    Tobacco Social History   Tobacco Use  Smoking Status Current Every Day Smoker  . Packs/day: 1.00  . Years: 53.00  . Pack years: 53.00  . Types: Cigarettes  Smokeless Tobacco Never Used  Tobacco Comment   Counseled that the single most powerful action she could take to decrease her risk of lung cancer is to quit smoking     Ready to quit: Not Answered Counseling given: Not Answered Comment: Counseled that the single most powerful action she could take to decrease her risk of lung cancer is to quit smoking   Clinical Intake:  Pre-visit preparation completed: Yes  Pain : No/denies pain  Diabetes: Yes CBG done?: No Did pt. bring in CBG monitor from home?: No  How often do you need to have someone help you when you read instructions, pamphlets, or other written materials from your doctor or pharmacy?: 1 - Never  Interpreter Needed?: No  Information entered by :: CDenman George LPN  Past Medical History:  Diagnosis Date  . Allergy    minor hay fever  . Arthritis    hands x 2, knee   . Cataract    small  . Chicken pox 02/24/2014   Has had shingles vaccine  . Chronic diarrhea    For one year-needed prolonged course of antibiotics  . Diabetes mellitus without complication ([place]   . Diverticulosis 02/24/2014  . Emphysema of lung ([place]    mild per pt. per x ray   . History of UTI 2014   per pt noted after taking Bactrim due to tooth infection  . Hyperlipidemia   . Neuromuscular disorder (HCC)    neuropathy both feet   . Osteopenia   . 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 Vaginal prolapse    Status post surgery. Improved urinary incontinence   Past Surgical History:  Procedure Laterality Date  . ABDOMINAL HYSTERECTOMY  09-07-2013  . ABDOMINAL SACROCOLPOPEXY  09-07-2013  . BLADDER SURGERY  09/07/2013   vaginal prolapse  . COLONOSCOPY  11-06-2005   sig tics, no polyps-[facility]  . CYSTOSCOPY  09-07-2013  . ENTEROCELE REPAIR  09-07-2013  . INDUCED ABORTION  1985   Therapeutic  . LAPAROSCOPIC BILATERAL SALPINGO OOPHERECTOMY  09-07-2013  . POLYPECTOMY    . PUBOVAGINAL SLING  09-07-2013  . TONSILLECTOMY Bilateral 1955  .  TUBAL LIGATION     Family History  Problem Relation Age of Onset  . Cancer Father        Lung but was a smoker  . Stroke Father        9 massive lead to death  . Diabetes Father   . Bladder Cancer Father   . Lung cancer Father   . Osteoporosis Mother   . Anuerysm Mother   . Diabetes Maternal Grandmother   . Colon cancer Neg Hx   . Colon polyps Neg Hx   . Esophageal cancer Neg Hx   . Rectal cancer Neg Hx   . Stomach cancer Neg Hx   . Breast cancer Neg Hx    Social History   Socioeconomic History  . Marital status: Married    Spouse name: Not on file  . Number of children: Not on file  . Years of education: Not on file  . Highest education level: Not on file  Occupational History  . Not on  file  Social Needs  . Financial resource strain: Not on file  . Food insecurity    Worry: Not on file    Inability: Not on file  . Transportation needs    Medical: Not on file    Non-medical: Not on file  Tobacco Use  . Smoking status: Current Every Day Smoker    Packs/day: 1.00    Years: 53.00    Pack years: 53.00    Types: Cigarettes  . Smokeless tobacco: Never Used  . Tobacco comment: Counseled that the single most powerful action she could take to decrease her risk of lung cancer is to quit smoking  Substance and Sexual Activity  . Alcohol use: No    Alcohol/week: 0.0 standard drinks  . Drug use: No  . Sexual activity: Not on file  Lifestyle  . Physical activity    Days per week: Not on file    Minutes per session: Not on file  . Stress: Not on file  Relationships  . Social Herbalist on phone: Not on file    Gets together: Not on file    Attends religious service: Not on file    Active member of club or organization: Not on file    Attends meetings of clubs or organizations: Not on file    Relationship status: Not on file  Other Topics Concern  . Not on file  Social History Narrative   Married 45 years in 2015.  Moved to [place] to be near daughter who lives here. Has a son as well and 22 year old grandson. 3 granddogs. One dog at home       Retired former  Academic librarian. Has a BS in biology from [place]. Used to be an algologist-studied algae under microscope      Hobbies-exercising with her husband       Outpatient Encounter Medications as of 03/15/2019  Medication Sig  . ACCU-CHEK FASTCLIX LANCETS MISC USE TO CHECK BLOOD SUGARS  3-4 TIMES DAILY. E11.4  . aspirin 81 MG tablet Take 81 mg by mouth daily.  [name] atorvastatin (LIPITOR) 20 MG tablet TAKE 1 TABLET BY MOUTH  DAILY  . Blood Glucose Monitoring Suppl (ACCU-CHEK NANO SMARTVIEW) w/Device KIT Use to check blood sugars 3-4 times daily  . Calcium Carbonate-Vit D-Min (CALCIUM 1200 PO) Take  2 tablets by mouth.  . Cholecalciferol (VITAMIN D3) 2000 UNITS TABS Take 1 tablet by mouth.  . diclofenac sodium (VOLTAREN) 1 %  GEL Apply topically to affected area qid  . fexofenadine (ALLEGRA) 180 MG tablet Take 180 mg by mouth daily.  [name] glucose blood (ACCU-CHEK SMARTVIEW) test strip USE TO TEST BLOOD SUGARS  3-4 TIMES DAILY. E11.9  . ibuprofen (ADVIL,MOTRIN) 400 MG tablet Take 400 mg by mouth at bedtime as needed. Reported on 09/04/2015  . lisinopril (ZESTRIL) 5 MG tablet TAKE 1 TABLET BY MOUTH  DAILY  . Magnesium 500 MG CAPS Take 1 capsule by mouth.  . metFORMIN (GLUCOPHAGE) 1000 MG tablet TAKE 1 TABLET BY MOUTH TWO  TIMES DAILY WITH A MEAL  . Multiple Vitamins-Minerals (MULTIVITAMIN PO) Take 1 tablet by mouth.  . Omega-3 Fatty Acids (FISH OIL PO) Take 2 tablets by mouth 2 (two) times daily. 1,000 mg  . OVER THE COUNTER MEDICATION Herbal tea 8 ounces daily  . Probiotic Product (PROBIOTIC DAILY PO) Take 1 tablet by mouth.  . Wheat Dextrin (BENEFIBER PO) Take 1 tablet by mouth.   No facility-administered encounter medications on file as of 03/15/2019.     Activities of Daily Living In your present state of health, do you have any difficulty performing the following activities: 03/15/2019  Hearing? N  Vision? N  Difficulty concentrating or making decisions? N  Walking or climbing stairs? N  Dressing or bathing? N  Doing errands, shopping? N  Preparing Food and eating ? N  Using the Toilet? N  Do you have problems with loss of bowel control? N  Managing your Medications? N  Managing your Finances? N  Housekeeping or managing your Housekeeping? N  Some recent data might be hidden    Patient Care Team: Marin Olp, MD as PCP - General (Family Medicine) Celestia Khat, [place] as Consulting Physician (Optometry) Gerda Diss, DO as Consulting Physician (Sports Medicine)    Assessment:   This is a routine wellness examination for Alpha.  Exercise Activities and Dietary  recommendations Current Exercise Habits: Home exercise routine, Type of exercise: walking, Time (Minutes): 30, Frequency (Times/Week): 5, Weekly Exercise (Minutes/Week): 150, Intensity: Mild  Goals    . Quit Smoking     Is trying to quit smoking Evaluating different program     . Quit smoking / using tobacco     Smoking;  Educated to avoid secondary smoke  Classes offered a couple of times a month;  Will work with the patient as far as registration and location  Meds may help; chatix (Varenicline); Zyban (Bupropion SR); Nicotine Replacement (gum; lozenges; patches; etc.)   30 pack yr smoking hx: Educated regarding LDCT; To discuss with MD at next fup. Also educated on AAA screening for men 65-75 who have smoked  Try to start thinking of your without cigarettes  I can quit          Fall Risk Fall Risk  03/15/2019 03/12/2018 03/12/2018 03/11/2017 09/09/2016  Falls in the past year? 1 No No Yes No  Number falls in past yr: 0 - - 1 -  Injury with Fall? 1 - - No -  Follow up - - - Education provided -  Comment - - - she was no a boat, fishing  -   Is the patient's home free of loose throw rugs in walkways, pet beds, electrical cords, etc?   yes      Grab bars in the bathroom? yes      Handrails on the stairs?   yes      Adequate lighting?   yes  Timed Get Up and  Go performed: completed and within normal timeframe; no gait abnormalities noted    Depression Screen PHQ 2/9 Scores 03/15/2019 12/29/2018 03/12/2018 03/12/2018  PHQ - 2 Score 0 0 0 0     Cognitive Function- no cognitive concerns at this time  MMSE - Mini Mental State Exam 03/12/2018 03/11/2017  Not completed: (No Data) (No Data)     6CIT Screen 03/15/2019  What Year? 0 points  What month? 0 points  What time? 0 points  Count back from 20 0 points  Months in reverse 0 points  Repeat phrase 0 points  Total Score 0    Immunization History  Administered Date(s) Administered  . Fluad Quad(high Dose 65+) 03/15/2019   . Hepatitis A 02/28/1948  . Hepatitis B 03/28/1947  . Influenza, High Dose Seasonal PF 03/17/2014, 03/07/2016, 03/11/2017, 03/12/2018  . Influenza,inj,Quad PF,6+ Mos 03/02/2015  . Influenza-Unspecified 03/10/2010, 02/25/2012, 03/17/2013  . MMR 05/14/1990, 05/14/1992  . Pneumococcal Conjugate-13 03/17/2013, 06/07/2013  . Pneumococcal Polysaccharide-23 09/19/2005, 02/24/2014  . Pneumococcal-Unspecified 09/19/2005  . Td 02/28/1950, 02/29/1956, 09/19/2005, 03/07/2016  . Tdap 02/29/1960  . Varicella 10/19/1991  . Zoster 02/28/2009  . Zoster Recombinat (Shingrix) 10/28/2016, 01/30/2017    Qualifies for Shingles Vaccine? Shingrix completed   Screening Tests Health Maintenance  Topic Date Due  . OPHTHALMOLOGY EXAM  04/28/2019  . HEMOGLOBIN A1C  07/01/2019  . FOOT EXAM  07/15/2019  . MAMMOGRAM  04/13/2020  . COLONOSCOPY  01/21/2024  . TETANUS/TDAP  03/07/2026  . INFLUENZA VACCINE  Completed  . DEXA SCAN  Completed  . Hepatitis C Screening  Completed  . PNA vac Low Risk Adult  Completed    Cancer Screenings: Lung: Low Dose CT Chest recommended if Age 41-80 years, 30 pack-year currently smoking OR have quit w/in 15years. Patient does qualify.  (Followed by Eric Form NP for yearly CT scan)  Breast:  Up to date on Mammogram? Yes, scheduled  Up to date of Bone Density/Dexa? Yes Colorectal: completed 01/21/19 with Dr. Hilarie Fredrickson      Plan:  I have personally reviewed and addressed the Medicare Annual Wellness questionnaire and have noted the following in the patient's chart:  A. Medical and social history B. Use of alcohol, tobacco or illicit drugs  C. Current medications and supplements D. Functional ability and status E.  Nutritional status F.  Physical activity G. Advance directives H. List of other physicians I.  Hospitalizations, surgeries, and ER visits in previous 12 months J.  [place] such as hearing and vision if needed, cognitive and depression L.  Referrals, records requested, and appointments- none   In addition, I have reviewed and discussed with patient certain preventive protocols, quality metrics, and best practice recommendations. A written personalized care plan for preventive services as well as general preventive health recommendations were provided to patient.   Signed,  Denman George, LPN  Nurse Health Advisor   Nurse Notes: no additional

## 2019-03-15 NOTE — Progress Notes (Addendum)
Phone: [phone]   Subjective:  Patient presents today for their annual physical. Chief complaint-noted.   See problem oriented charting- ROS- full  review of systems was completed and negative except for: some right knee pain  The following were reviewed and entered/updated in epic: Past Medical History:  Diagnosis Date  . Allergy    minor hay fever  . Arthritis    hands x 2, knee   . Cataract    small  . Chicken pox 02/24/2014   Has had shingles vaccine  . Chronic diarrhea    For one year-needed prolonged course of antibiotics  . Diabetes mellitus without complication ([place])   . Diverticulosis 02/24/2014  . Emphysema of lung ([place])    mild per pt. per x ray   . History of UTI 2014   per pt noted after taking Bactrim due to tooth infection  . Hyperlipidemia   . Neuromuscular disorder (HCC)    neuropathy both feet   . Osteopenia   . 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 Vaginal prolapse    Status post surgery. Improved urinary incontinence   Patient Active Problem List   Diagnosis Date Noted  . DM (diabetes mellitus) type II controlled, neurological manifestation ([place]) 02/24/2014    Priority: High  . Tobacco abuse 02/24/2014    Priority: High  . Osteopenia 03/30/2014    Priority: Medium  . Hypertension associated with diabetes ([place]) 03/08/2014    Priority: Medium  . Hyperlipidemia associated with type 2 diabetes mellitus ([place]) 02/24/2014    Priority: Medium  . Aortic atherosclerosis ([place]) 07/08/2017    Priority: Low  . Diabetic peripheral neuropathy ([place]) 10/27/2014    Priority: Low  . Seasonal allergies 02/24/2014    Priority: Low   Past Surgical History:  Procedure Laterality Date  . ABDOMINAL HYSTERECTOMY  09-07-2013  . ABDOMINAL SACROCOLPOPEXY  09-07-2013  . BLADDER SURGERY  09/07/2013   vaginal prolapse  . COLONOSCOPY  11-06-2005   sig tics, no polyps-[facility]   . CYSTOSCOPY  09-07-2013  . ENTEROCELE REPAIR  09-07-2013  .  INDUCED ABORTION  1985   Therapeutic  . LAPAROSCOPIC BILATERAL SALPINGO OOPHERECTOMY  09-07-2013  . POLYPECTOMY    . PUBOVAGINAL SLING  09-07-2013  . TONSILLECTOMY Bilateral 1955  . TUBAL LIGATION      Family History  Problem Relation Age of Onset  . Cancer Father        Lung but was a smoker  . Stroke Father        56 massive lead to death  . Diabetes Father   . Bladder Cancer Father   . Lung cancer Father   . Osteoporosis Mother   . Anuerysm Mother   . Diabetes Maternal Grandmother   . Colon cancer Neg Hx   . Colon polyps Neg Hx   . Esophageal cancer Neg Hx   . Rectal cancer Neg Hx   . Stomach cancer Neg Hx   . Breast cancer Neg Hx     Medications- reviewed and updated Current Outpatient Medications  Medication Sig Dispense Refill  . ACCU-CHEK FASTCLIX LANCETS MISC USE TO CHECK BLOOD SUGARS  3-4 TIMES DAILY. E11.4 408 each 11  . aspirin 81 MG tablet Take 81 mg by mouth daily.    [name] atorvastatin (LIPITOR) 20 MG tablet TAKE 1 TABLET BY MOUTH  DAILY 90 tablet 3  . Blood Glucose Monitoring Suppl (ACCU-CHEK NANO SMARTVIEW) w/Device KIT Use to check blood  sugars 3-4 times daily 1 kit 0  . Calcium Carbonate-Vit D-Min (CALCIUM 1200 PO) Take 2 tablets by mouth.    . Cholecalciferol (VITAMIN D3) 2000 UNITS TABS Take 1 tablet by mouth.    . diclofenac sodium (VOLTAREN) 1 % GEL Apply topically to affected area qid 100 g 1  . fexofenadine (ALLEGRA) 180 MG tablet Take 180 mg by mouth daily.    [name] glucose blood (ACCU-CHEK SMARTVIEW) test strip USE TO TEST BLOOD SUGARS  3-4 TIMES DAILY. E11.9 400 each 11  . ibuprofen (ADVIL,MOTRIN) 400 MG tablet Take 400 mg by mouth at bedtime as needed. Reported on 09/04/2015    . lisinopril (ZESTRIL) 5 MG tablet TAKE 1 TABLET BY MOUTH  DAILY 90 tablet 3  . Magnesium 500 MG CAPS Take 1 capsule by mouth.    . metFORMIN (GLUCOPHAGE) 1000 MG tablet TAKE 1 TABLET BY MOUTH TWO  TIMES DAILY WITH A MEAL 180 tablet 3  . Multiple Vitamins-Minerals (MULTIVITAMIN PO)  Take 1 tablet by mouth.    . Omega-3 Fatty Acids (FISH OIL PO) Take 2 tablets by mouth 2 (two) times daily. 1,000 mg    . OVER THE COUNTER MEDICATION Herbal tea 8 ounces daily    . Probiotic Product (PROBIOTIC DAILY PO) Take 1 tablet by mouth.    . Wheat Dextrin (BENEFIBER PO) Take 1 tablet by mouth.     No current facility-administered medications for this visit.     Allergies-reviewed and updated No Known Allergies  Social History   Social History Narrative   Married 45 years in 2015.  Moved to [place] to be near daughter who lives here. Has a son as well and 10 year old grandson. 3 granddogs. One dog at home       Retired former  Academic librarian. Has a BS in biology from [employer]. Used to be an algologist-studied algae under microscope      Hobbies-exercising with her husband      Objective  Objective:  BP 118/80   Pulse 73   Temp 97.7 F (36.5 C)   Ht _0  (1.753 m)   Wt 186 lb 6.4 oz (84.6 kg)   LMP  (LMP Unknown)   SpO2 95%   BMI 27.53 kg/m  Gen: NAD, resting comfortably HEENT: Mucous membranes are moist. Oropharynx normal Neck: no thyromegaly CV: RRR no murmurs rubs or gallops Lungs: CTAB no crackles, wheeze, rhonchi Abdomen: soft/nontender/nondistended/normal bowel sounds. No rebound or guarding.  Ext: 1+ edema Skin: warm, dry Neuro: grossly normal, moves all extremities, PERRLA Msk: right knee with some crepitus- exam otherwise normal.    Assessment and Plan    73 y.o. female presenting for annual physical.  Health Maintenance counseling: 1. Anticipatory guidance: Patient counseled regarding regular dental exams -q4 months, eye exams - yearly,  avoiding smoking and second hand smoke , limiting alcohol to 1 beverage per day- no alcohol .   2. Risk factor reduction:  Advised patient of need for regular exercise and diet rich and fruits and vegetables to reduce risk of heart attack and stroke. Exercise-walking limited by neuropathy- have encouraged  biking on their aerodyne- not exercising currently. Diet-.  She was 196 September 2019-her long-term goal has been 150. Down 10 lbs from last CPE- watching diet closely.  Wt Readings from Last 3 Encounters:  03/15/19 186 lb 6.4 oz (84.6 kg)  01/21/19 185 lb (83.9 kg)  01/07/19 185 lb (83.9 kg)  3. Immunizations/screenings/ancillary studies-flu shot today- high dose  Immunization  History  Administered Date(s) Administered  . Hepatitis A 02[birth year]28[birth year]1950  . Hepatitis B 03[birth year]28[birth year]1949  . Influenza, High Dose Seasonal PF 03/17/2014, 03/07/2016, 03/11/2017, 03/12/2018  . Influenza,inj,Quad PF,6+ Mos 03/02/2015  . Influenza-Unspecified 03/10/2010, 02/25/2012, 03/17/2013  . MMR 05/14/1990, 05/14/1992  . Pneumococcal Conjugate-13 03/17/2013, 06/07/2013  . Pneumococcal Polysaccharide-23 09/19/2005, 02/24/2014  . Pneumococcal-Unspecified 09/19/2005  . Td 02/28/1950, 02/29/1956, 09/19/2005, 03/07/2016  . Tdap 02/29/1960  . Varicella 10/19/1991  . Zoster 02/28/2009  . Zoster Recombinat (Shingrix) 10/28/2016, 01/30/2017  4. Cervical cancer screening- past age to based screening recommendations.  No history of abnormal Pap smear.  No vaginal bleeding. 5. Breast cancer screening-  breast exam declined-prefer self exams only- and mammogram -gets these yearly and is scheduled on October 23 6. Colon cancer screening - had polyps 2017.  Repeat in 2020-adenoma 12/2018- now stretched to 5 years 7. Skin cancer screening-no dermatologist. advised regular sunscreen use. Denies worrisome, changing, or new skin lesions.  8. Birth control/STD check- postmenopausal/monogamous 9. Osteoporosis screening at 50- Osteopenia - Taking Calcium + Vit D.  Last DEXA 04/13/2018- likely 2 to 3-year follow-up-there was some worsening.  Encourage weightbearing exercise but difficult with neuropathy  -Current smoker-1 pack/day.  Chantix did not help.  She is enrolled in lung cancer screening program.  Will get urinalysis  Status of chronic  or acute concerns  Hypertension - Taking Lisinopril 5 mg daily.  Well-controlled today, pt is not checking BP at home.  Diabetes - Taking Metformin 1000 mg BID.  Too early for A1c repeat. Pt states her sugars have been doing well around 120-130 fasting and in the evening 100-115. Pt states she is not doing any exercise and her eating habits are typically healthy- encouraged exercise. No low blood sugar- she will let me know if it gets this low.  Lab Results  Component Value Date   HGBA1C 6.8 (H) 12/29/2018   HGBA1C 6.4 (A) 07/14/2018   HGBA1C 7.3 (H) 03/12/2018   Diabetic neuropathy- patient requires customized shoes to prevent calluses/reduce risk of ulceration. shes ok on paperwork right now- has not burned through them as much with covid 19. More numbness and pain this year. Getting laser treatments every 2 weeks.  Was far worse when was not able to go.   Hyperlipidemia - Taking Atorvastatin 20 mg daily.  LDL goal at least under 100-would prefer under 70 pt is doing well on her statins.  Lab Results  Component Value Date   CHOL 128 03/12/2018   HDL 41.20 03/12/2018   LDLCALC 49 03/12/2018   LDLDIRECT 73.0 03/11/2017   TRIG 190.0 (H) 03/12/2018   CHOLHDL 3 03/12/2018    Aortic atherosclerosis noted-continue risk factor modification. Likely stable   Knee - mild swelling at times on right- no significant pain. Will monitor. Can try voltaren gel on knees if bothers her  recommended follow up: 59-monthfollow-up given excellent A1c control Future Appointments  Date Time Provider [place] 03/15/2019  9:30 AM SDenman GeorgeB, LPN [facility]  178/24/2353 8:50 AM [facility] MM 3 [facility]   Lab/Order associations: fasting   ICD-10-CM   1. Preventative health care  Z00.00 CBC    Lipid panel    Comprehensive metabolic panel    POCT Urinalysis Dipstick (Automated)  2. Controlled type 2 diabetes mellitus with diabetic polyneuropathy, without long-term current use of  insulin (HCC)  E11.42 CBC    Lipid panel    Comprehensive metabolic panel    POCT Urinalysis Dipstick (Automated)  3. Hypertension associated with diabetes ([place])  E11.59 CBC   I10 Lipid panel    Comprehensive metabolic panel    POCT Urinalysis Dipstick (Automated)  4. Hyperlipidemia associated with type 2 diabetes mellitus (HCC)  E11.69 CBC   E78.5 Lipid panel    Comprehensive metabolic panel  5. Osteopenia of lumbar spine  M85.88   6. Tobacco abuse  Z72.0 POCT Urinalysis Dipstick (Automated)  7. Aortic atherosclerosis (HCC)  I70.0   8. Diabetic peripheral neuropathy (HCC)  E11.42    Return precautions advised.  Garret Reddish, MD

## 2019-03-15 NOTE — Addendum Note (Signed)
Addended by: Jasper Loser on: 03/15/2019 09:52 AM   Modules accepted: Orders

## 2019-03-15 NOTE — Addendum Note (Signed)
Addended by: Francis Dowse T on: 03/15/2019 09:52 AM   Modules accepted: Orders

## 2019-03-15 NOTE — Progress Notes (Signed)
I have reviewed and agree with note, evaluation, plan.  Please note-patient is already enrolled in lung cancer screening program  Garret Reddish, MD

## 2019-03-20 ENCOUNTER — Other Ambulatory Visit: Payer: Self-pay | Admitting: Family Medicine

## 2019-04-15 ENCOUNTER — Other Ambulatory Visit: Payer: Self-pay | Admitting: Family Medicine

## 2019-04-16 ENCOUNTER — Ambulatory Visit
Admission: RE | Admit: 2019-04-16 | Discharge: 2019-04-16 | Disposition: A | Discharge status: Home / Self Care | Payer: Medicare Other | Source: Ambulatory Visit | Attending: Family Medicine | Admitting: Family Medicine

## 2019-04-16 ENCOUNTER — Other Ambulatory Visit: Payer: Self-pay

## 2019-04-16 DIAGNOSIS — Z1231 Encounter for screening mammogram for malignant neoplasm of breast: Secondary | ICD-10-CM

## 2019-05-05 ENCOUNTER — Other Ambulatory Visit: Payer: Self-pay

## 2019-05-05 ENCOUNTER — Encounter: Payer: Self-pay | Admitting: Family Medicine

## 2019-05-05 MED ORDER — DICLOFENAC SODIUM 1 % EX GEL: 2.0000 g | Freq: Four times a day (QID) | CUTANEOUS | 3 refills | Status: DC

## 2019-06-02 LAB — HM DIABETES EYE EXAM

## 2019-06-25 HISTORY — PX: TOE AMPUTATION: SHX809

## 2019-07-19 ENCOUNTER — Ambulatory Visit (INDEPENDENT_AMBULATORY_CARE_PROVIDER_SITE_OTHER): Payer: Medicare PPO | Admitting: Family Medicine

## 2019-07-19 ENCOUNTER — Encounter: Payer: Self-pay | Admitting: Family Medicine

## 2019-07-19 VITALS — Ht 69.0 in | Wt 185.0 lb

## 2019-07-19 DIAGNOSIS — Z20822 Contact with and (suspected) exposure to covid-19: Secondary | ICD-10-CM | POA: Diagnosis not present

## 2019-07-19 NOTE — Progress Notes (Signed)
Phone [phone] Virtual visit via Video note   Subjective:  Chief complaint: Chief Complaint  Patient presents with  . virtual  . covid exposure    This visit type was conducted due to national recommendations for restrictions regarding the COVID-19 Pandemic (e.g. social distancing).  This format is felt to be most appropriate for this patient at this time balancing risks to patient and risks to population by having him in for in person visit.  No physical exam was performed (except for noted visual exam or audio findings with Telehealth visits).    Our team/I connected with [name] at 11:40 AM EST by a video enabled telemedicine application ([email or web address] or caregility through epic) and verified that I am speaking with the correct person using two identifiers.  Location patient: [facility] Location provider: [facility], office Persons participating in the virtual visit:  patient  Our team/I discussed the limitations of evaluation and management by telemedicine and the availability of in person appointments. In light of current covid-19 pandemic, patient also understands that we are trying to protect them by minimizing in office contact if at all possible.  The patient expressed consent for telemedicine visit and agreed to proceed. Patient understands insurance will be billed.   Past Medical History-  Patient Active Problem List   Diagnosis Date Noted  . DM (diabetes mellitus) type II controlled, neurological manifestation ([place]) 02/24/2014    Priority: High  . Tobacco abuse 02/24/2014    Priority: High  . Osteopenia 03/30/2014    Priority: Medium  . Hypertension associated with diabetes ([place]) 03/08/2014    Priority: Medium  . Hyperlipidemia associated with type 2 diabetes mellitus ([place]) 02/24/2014    Priority: Medium  . Aortic atherosclerosis ([place]) 07/08/2017    Priority: Low  . Diabetic peripheral neuropathy ([place]) 10/27/2014    Priority: Low  . Seasonal allergies  02/24/2014    Priority: Low    Medications- reviewed and updated Current Outpatient Medications  Medication Sig Dispense Refill  . Accu-Chek FastClix Lancets MISC USE TO CHECK BLOOD SUGARS  3-4 TIMES DAILY. 408 each 3  . ACCU-CHEK SMARTVIEW test strip USE TO TEST BLOOD SUGARS  3-4 TIMES DAILY 400 strip 3  . aspirin 81 MG tablet Take 81 mg by mouth daily.    [name] atorvastatin (LIPITOR) 20 MG tablet TAKE 1 TABLET BY MOUTH  DAILY 90 tablet 3  . Blood Glucose Monitoring Suppl (ACCU-CHEK NANO SMARTVIEW) w/Device KIT Use to check blood sugars 3-4 times daily 1 kit 0  . Calcium Carbonate-Vit D-Min (CALCIUM 1200 PO) Take 2 tablets by mouth.    . Cholecalciferol (VITAMIN D3) 2000 UNITS TABS Take 1 tablet by mouth.    . diclofenac Sodium (VOLTAREN) 1 % GEL Apply 2 g topically 4 (four) times daily. 100 g 3  . fexofenadine (ALLEGRA) 180 MG tablet Take 180 mg by mouth daily.    [name] ibuprofen (ADVIL,MOTRIN) 400 MG tablet Take 400 mg by mouth at bedtime as needed. Reported on 09/04/2015    . lisinopril (ZESTRIL) 5 MG tablet TAKE 1 TABLET BY MOUTH  DAILY 90 tablet 3  . Magnesium 500 MG CAPS Take 1 capsule by mouth.    . metFORMIN (GLUCOPHAGE) 1000 MG tablet TAKE 1 TABLET BY MOUTH TWO  TIMES DAILY WITH A MEAL 180 tablet 3  . Multiple Vitamins-Minerals (MULTIVITAMIN PO) Take 1 tablet by mouth.    . Omega-3 Fatty Acids (FISH OIL PO) Take 2 tablets by mouth 2 (two) times daily. 1,000 mg    .  OVER THE COUNTER MEDICATION Herbal tea 8 ounces daily    . Probiotic Product (PROBIOTIC DAILY PO) Take 1 tablet by mouth.    . Wheat Dextrin (BENEFIBER PO) Take 1 tablet by mouth.     No current facility-administered medications for this visit.     Objective:  Ht 5' 9"  (1.753 m)   Wt 185 lb (83.9 kg)   LMP  (LMP Unknown)   BMI 27.32 kg/m  self reported vitals Gen: NAD, resting comfortably Lungs: nonlabored, normal respiratory rate  Skin: appears dry, no obvious rash     Assessment and Plan   Possible Covid  Exposure S: pt states she is not sure if she has been exposed covid 19.  Patient has a laser massage therapist who was consistent worked with the patient on the 19th we did not testing positive for COVID-19-this was in the different room from the patient with the assistant did spend time with the massage therapist around that time.  Patient was in the office on the 21st and found out on 22 January.  Please massage therapist and assistant and provider testing tomorrow and repeating February 7th.  They are being cautious at home until he find out results-husband and she are masking up in house and using different beds and bathrooms.   everyone was masked and they use an air filtration system- Leonore is first patient of the day. So she is not sure weather she has truly been exposed or not. Pt denies any covid symptoms.  Ros- No fever, chills, cough, congestion, runny nose, shortness of breath, new fatigue, new body aches, sore throat, headache, nausea, vomiting, diarrhea, or new loss of taste or smell.   A/P: Possible covid 19 exposure.  Overall I think the risk of transmission to patient is rather low and sounds like laser massage therapist was very careful-even shutting down her clinic until she is sure her tests are negative -I am glad she is still being cautious and I agree if her massage specialist/laser therapist is positive that she should be tested-gave her information on how to be tested.  Also went through all symptoms that if they occur she should be tested  Recommended follow up: Already scheduled for March visit Future Appointments  Date Time Provider [place]  09/15/2019  8:00 AM Marin Olp, MD [facility]    Lab/Order associations:   ICD-10-CM   1. Contact with and (suspected) exposure to covid-19  Z20.822    Time Spent: 14 minutes of total time (12:06 PM- 12:20PM) was spent on the date of the encounter performing the following actions: chart review prior to seeing the  patient, obtaining history, performing a medically necessary exam, counseling on the treatment plan, placing orders, and documenting in our EHR.   Return precautions advised.  Garret Reddish, MD

## 2019-07-19 NOTE — Patient Instructions (Addendum)
Health Maintenance Due  Topic Date Due  . HEMOGLOBIN A1C -will address at next OV 07/01/2019  . FOOT EXAM -will address at next [place] 07/15/2019   If you are close contact was positive for COVID-19 I would suggest getting tested.  I do not think you need to be tested if her test comes back negative.  On the other hand if you develop any symptoms then we should also get you tested- fever, chills, cough, congestion, runny nose, shortness of breath, fatigue, body aches, sore throat, headache, nausea, vomiting, diarrhea, or new loss of taste or smell.   You do not have to call me if you decide to get tested but please definitely let me know if your test is positive

## 2019-08-02 ENCOUNTER — Other Ambulatory Visit: Payer: Self-pay

## 2019-08-02 ENCOUNTER — Encounter: Payer: Self-pay | Admitting: Family Medicine

## 2019-08-02 MED ORDER — DICLOFENAC SODIUM 1 % EX GEL: 2.0000 g | Freq: Four times a day (QID) | CUTANEOUS | 3 refills | Status: DC

## 2019-08-04 ENCOUNTER — Encounter: Payer: Self-pay | Admitting: Family Medicine

## 2019-08-15 ENCOUNTER — Ambulatory Visit: Payer: Medicare PPO | Attending: Internal Medicine

## 2019-08-15 DIAGNOSIS — Z23 Encounter for immunization: Secondary | ICD-10-CM

## 2019-08-15 NOTE — Progress Notes (Signed)
   Covid-19 Vaccination Clinic  Name:  Samantha Snyder    MRN: [id number] DOB: 1946-01-27  08/15/2019  [name] was observed post Covid-19 immunization for 15 minutes without incidence. She was provided with Vaccine Information Sheet and instruction to access the V-Safe system.   [name] was instructed to call 911 with any severe reactions post vaccine: [name] Difficulty breathing  . Swelling of your face and throat  . A fast heartbeat  . A bad rash all over your body  . Dizziness and weakness    Immunizations Administered    Name Date Dose VIS Date Route   Pfizer COVID-19 Vaccine 08/15/2019  9:45 AM 0.3 mL 06/04/2019 Intramuscular   Manufacturer: [place]   Lot: [id number]   [place]: [id number]

## 2019-09-08 ENCOUNTER — Ambulatory Visit: Payer: Medicare PPO | Attending: Internal Medicine

## 2019-09-08 DIAGNOSIS — Z23 Encounter for immunization: Secondary | ICD-10-CM

## 2019-09-08 NOTE — Progress Notes (Signed)
   Covid-19 Vaccination Clinic  Name:  Samantha Snyder    MRN: [id number] DOB: [birth year]  09/08/2019  Samantha Snyder was observed post Covid-19 immunization for 15 minutes without incident. She was provided with Vaccine Information Sheet and instruction to access the V-Safe system.   Samantha Snyder was instructed to call 911 with any severe reactions post vaccine: [name] Difficulty breathing  . Swelling of face and throat  . A fast heartbeat  . A bad rash all over body  . Dizziness and weakness   Immunizations Administered    Name Date Dose VIS Date Route   Pfizer COVID-19 Vaccine 09/08/2019  8:24 AM 0.3 mL 06/04/2019 Intramuscular   Manufacturer: [place]   Lot: [id number]   [place]: [id number]

## 2019-09-15 ENCOUNTER — Ambulatory Visit: Payer: Medicare PPO | Admitting: Family Medicine

## 2019-09-15 ENCOUNTER — Encounter: Payer: Self-pay | Admitting: Family Medicine

## 2019-09-15 ENCOUNTER — Other Ambulatory Visit: Payer: Self-pay

## 2019-09-15 VITALS — BP 108/62 | HR 82 | Temp 97.5°F | Ht 69.0 in | Wt 188.4 lb

## 2019-09-15 DIAGNOSIS — E785 Hyperlipidemia, unspecified: Secondary | ICD-10-CM | POA: Diagnosis not present

## 2019-09-15 DIAGNOSIS — I152 Hypertension secondary to endocrine disorders: Secondary | ICD-10-CM

## 2019-09-15 DIAGNOSIS — E1169 Type 2 diabetes mellitus with other specified complication: Secondary | ICD-10-CM

## 2019-09-15 DIAGNOSIS — E1142 Type 2 diabetes mellitus with diabetic polyneuropathy: Secondary | ICD-10-CM

## 2019-09-15 DIAGNOSIS — Z720 Tobacco use: Secondary | ICD-10-CM | POA: Diagnosis not present

## 2019-09-15 DIAGNOSIS — I700 Atherosclerosis of aorta: Secondary | ICD-10-CM | POA: Diagnosis not present

## 2019-09-15 DIAGNOSIS — I10 Essential (primary) hypertension: Secondary | ICD-10-CM | POA: Diagnosis not present

## 2019-09-15 DIAGNOSIS — E1159 Type 2 diabetes mellitus with other circulatory complications: Secondary | ICD-10-CM

## 2019-09-15 DIAGNOSIS — M8588 Other specified disorders of bone density and structure, other site: Secondary | ICD-10-CM

## 2019-09-15 LAB — POCT GLYCOSYLATED HEMOGLOBIN (HGB A1C): Hemoglobin A1C: 6.5 % — AB (ref 4.0–5.6)

## 2019-09-15 MED ORDER — ATORVASTATIN CALCIUM 20 MG PO TABS: 20.0000 mg | ORAL_TABLET | Freq: Every day | ORAL | 3 refills | Status: DC

## 2019-09-15 MED ORDER — METFORMIN HCL 1000 MG PO TABS: ORAL_TABLET | ORAL | 3 refills | Status: DC

## 2019-09-15 MED ORDER — LISINOPRIL 5 MG PO TABS: 5.0000 mg | ORAL_TABLET | Freq: Every day | ORAL | 3 refills | Status: DC

## 2019-09-15 NOTE — Patient Instructions (Addendum)
Health Maintenance Due  Topic Date Due  . HEMOGLOBIN A1C - today with labs 07/01/2019  . FOOT EXAM   Done in office  07/15/2019   Please stop by lab before you go If you do not have mychart- we will call you about results within 5 business days of [place] receiving them.  If you have mychart- we will send your results within 3 business days of [place] receiving them.  If abnormal or we want to clarify a result, we will call or mychart you to make sure you receive the message.  If you have questions or concerns or don't hear within 5 business days, please send [place] a message or call us.   As always would love for you to quit smoking   Recommended follow up: Return in about 6 months (around 03/17/2020) for follow up- or sooner if needed.

## 2019-09-15 NOTE — Progress Notes (Signed)
Phone [phone] In person visit   Subjective:   Samantha Snyder is a 74 y.o. year old very pleasant female patient who presents for/with See problem oriented charting Chief Complaint  Patient presents with  . Hypertension  . Diabetes  . Hyperlipidemia   This visit occurred during the SARS-CoV-2 public health emergency.  Safety protocols were in place, including screening questions prior to the visit, additional usage of staff PPE, and extensive cleaning of exam room while observing appropriate contact time as indicated for disinfecting solutions.   Past Medical History-  Patient Active Problem List   Diagnosis Date Noted  . DM (diabetes mellitus) type II controlled, neurological manifestation ([place]) 02/24/2014    Priority: High  . Tobacco abuse 02/24/2014    Priority: High  . Osteopenia 03/30/2014    Priority: Medium  . Hypertension associated with diabetes ([place]) 03/08/2014    Priority: Medium  . Hyperlipidemia associated with type 2 diabetes mellitus ([place]) 02/24/2014    Priority: Medium  . Aortic atherosclerosis ([place]) 07/08/2017    Priority: Low  . Diabetic peripheral neuropathy ([place]) 10/27/2014    Priority: Low  . Seasonal allergies 02/24/2014    Priority: Low    Medications- reviewed and updated Current Outpatient Medications  Medication Sig Dispense Refill  . Accu-Chek FastClix Lancets MISC USE TO CHECK BLOOD SUGARS  3-4 TIMES DAILY. 408 each 3  . ACCU-CHEK SMARTVIEW test strip USE TO TEST BLOOD SUGARS  3-4 TIMES DAILY 400 strip 3  . aspirin 81 MG tablet Take 81 mg by mouth daily.    [name] atorvastatin (LIPITOR) 20 MG tablet Take 1 tablet (20 mg total) by mouth daily. 90 tablet 3  . Blood Glucose Monitoring Suppl (ACCU-CHEK NANO SMARTVIEW) w/Device KIT Use to check blood sugars 3-4 times daily 1 kit 0  . Calcium Carbonate-Vit D-Min (CALCIUM 1200 PO) Take 2 tablets by mouth.    . Cholecalciferol (VITAMIN D3) 2000 UNITS TABS Take 1 tablet by mouth.    . diclofenac  Sodium (VOLTAREN) 1 % GEL Apply 2 g topically 4 (four) times daily. 100 g 3  . fexofenadine (ALLEGRA) 180 MG tablet Take 180 mg by mouth daily.    [name] ibuprofen (ADVIL,MOTRIN) 400 MG tablet Take 400 mg by mouth at bedtime as needed. Reported on 09/04/2015    . lisinopril (ZESTRIL) 5 MG tablet Take 1 tablet (5 mg total) by mouth daily. 90 tablet 3  . Magnesium 500 MG CAPS Take 1 capsule by mouth.    . metFORMIN (GLUCOPHAGE) 1000 MG tablet TAKE 1 TABLET BY MOUTH TWO  TIMES DAILY WITH A MEAL 180 tablet 3  . Multiple Vitamins-Minerals (MULTIVITAMIN PO) Take 1 tablet by mouth.    . Omega-3 Fatty Acids (FISH OIL PO) Take 2 tablets by mouth 2 (two) times daily. 1,000 mg    . OVER THE COUNTER MEDICATION Herbal tea 8 ounces daily    . Probiotic Product (PROBIOTIC DAILY PO) Take 1 tablet by mouth.    . Wheat Dextrin (BENEFIBER PO) Take 1 tablet by mouth.     No current facility-administered medications for this visit.     Objective:  BP 108/62   Pulse 82   Temp (!) 97.5 F (36.4 C) (Temporal)   Ht _0  (1.753 m)   Wt 188 lb 6.4 oz (85.5 kg)   LMP  (LMP Unknown)   SpO2 97%   BMI 27.82 kg/m  Gen: NAD, resting comfortably CV: RRR no murmurs rubs or gallops Lungs: CTAB no crackles,  wheeze, rhonchi Abdomen: soft/nontender/nondistended/normal bowel sounds. No rebound or guarding.  Ext: trace edema Skin: warm, dry  Diabetic Foot Exam - Simple   Simple Foot Form Diabetic Foot exam was performed with the following findings: Yes 09/15/2019  8:17 AM  Visual Inspection See comments: Yes Sensation Testing See comments: Yes Pulse Check Posterior Tibialis and Dorsalis pulse intact bilaterally: Yes Comments Has several callosus and hammer toe on both feet. Has reduced sensations bilaterally.         Assessment and Plan  # Hypertension  S:Taking Lisinopril 5 mg daily.  A/P: Stable. Continue current medications.    # Diabetes S: Compliant with Metformin 1000 mg BID. CBGs- 130s in am and can  be under 100 in daytime even. Nothing below 80 Exercise and diet- trying to eat well but not exercising well. Encouraged her to try husbands bike.  Lab Results  Component Value Date   HGBA1C 6.8 (H) 12/29/2018   HGBA1C 6.4 (A) 07/14/2018   HGBA1C 7.3 (H) 03/12/2018   A/P: controlled with poc a1c of 6.5 (team to update chart)- continue current meds for now - we recommended healthy eating/regular exercise as ewll as weight loss and ways to achieeve this  #hyperlipidemia/Aortic atherosclerosis ([place]) S: compliant with atorvastatin 20 mg Aortic atherosclerosis noted on imaging Lab Results  Component Value Date   CHOL 130 03/15/2019   HDL 40.40 03/15/2019   LDLCALC 53 03/15/2019   LDLDIRECT 73.0 03/11/2017   TRIG 182.0 (H) 03/15/2019   CHOLHDL 3 03/15/2019   A/P: well controlled - we will recheck full lipid panel at physical  # Diabetic neuropathy  S: has form to fill out so that she is able to continue to get supplies. patient requires customized shoes to prevent calluses/reduce risk of ulceration- she has a history of callous. shes ok on paperwork right now for inserts- has not burned through them as much with covid 19. More numbness and pain this year. Getting laser treatments every 2 weeks.  Was far worse when was not able to go.   Diabetic shoes Daniel - inserts in [place]. Uses voltaren gel.  A/P: neuropathy is largely stable- thankful she was able to get back to laser treatments. voltaren gel also helping - we will complete forms for her   # Tobacco abuse S: 1 pack per day  A/P: strongly encouraged cessation- she is not ready to quit   #Osteopenia of lumbar spine- on calcium and vitamin D- recommended quitting smoking.   Recommended follow up: Return in about 6 months (around 03/17/2020) for follow up- or sooner if needed.  Lab/Order associations:   ICD-10-CM   1. Controlled type 2 diabetes mellitus with diabetic polyneuropathy, without long-term current use of insulin (HCC)   E11.42 POCT glycosylated hemoglobin (Hb A1C)  2. Hypertension associated with diabetes ([place])  E11.59    I10   3. Hyperlipidemia associated with type 2 diabetes mellitus ([place])  E11.69    E78.5   4. Diabetic peripheral neuropathy (HCC)  E11.42   5. Aortic atherosclerosis (HCC)  I70.0   6. Osteopenia of lumbar spine  M85.88   7. Tobacco abuse  Z72.0     Meds ordered this encounter  Medications  . atorvastatin (LIPITOR) 20 MG tablet    Sig: Take 1 tablet (20 mg total) by mouth daily.    Dispense:  90 tablet    Refill:  3    Requesting 1 year supply  . lisinopril (ZESTRIL) 5 MG tablet    Sig: Take  1 tablet (5 mg total) by mouth daily.    Dispense:  90 tablet    Refill:  3    Requesting 1 year supply  . metFORMIN (GLUCOPHAGE) 1000 MG tablet    Sig: TAKE 1 TABLET BY MOUTH TWO  TIMES DAILY WITH A MEAL    Dispense:  180 tablet    Refill:  3   Return precautions advised.  Garret Reddish, MD

## 2019-10-18 DIAGNOSIS — E1142 Type 2 diabetes mellitus with diabetic polyneuropathy: Secondary | ICD-10-CM | POA: Diagnosis not present

## 2019-10-22 ENCOUNTER — Encounter: Payer: Self-pay | Admitting: Family Medicine

## 2019-10-22 NOTE — Telephone Encounter (Signed)
FYI

## 2019-10-25 DIAGNOSIS — M2012 Hallux valgus (acquired), left foot: Secondary | ICD-10-CM | POA: Diagnosis not present

## 2019-10-25 DIAGNOSIS — M205X1 Other deformities of toe(s) (acquired), right foot: Secondary | ICD-10-CM | POA: Diagnosis not present

## 2019-10-25 DIAGNOSIS — M2042 Other hammer toe(s) (acquired), left foot: Secondary | ICD-10-CM | POA: Diagnosis not present

## 2019-10-25 DIAGNOSIS — E1142 Type 2 diabetes mellitus with diabetic polyneuropathy: Secondary | ICD-10-CM | POA: Diagnosis not present

## 2019-10-25 DIAGNOSIS — M2011 Hallux valgus (acquired), right foot: Secondary | ICD-10-CM | POA: Diagnosis not present

## 2019-10-25 DIAGNOSIS — M2041 Other hammer toe(s) (acquired), right foot: Secondary | ICD-10-CM | POA: Diagnosis not present

## 2019-10-25 DIAGNOSIS — M205X2 Other deformities of toe(s) (acquired), left foot: Secondary | ICD-10-CM | POA: Diagnosis not present

## 2019-10-30 ENCOUNTER — Encounter: Payer: Self-pay | Admitting: Family Medicine

## 2019-11-04 DIAGNOSIS — E1149 Type 2 diabetes mellitus with other diabetic neurological complication: Secondary | ICD-10-CM | POA: Diagnosis not present

## 2019-11-04 DIAGNOSIS — E1142 Type 2 diabetes mellitus with diabetic polyneuropathy: Secondary | ICD-10-CM | POA: Diagnosis not present

## 2019-11-16 NOTE — Patient Instructions (Addendum)
Start Alpha lipoic Acid OTC.  Encourage to stop smoking.  Keep visit that's already scheduled for September.

## 2019-11-16 NOTE — Progress Notes (Signed)
Phone [phone] In person visit   Subjective:   Samantha Snyder is a 74 y.o. year old very pleasant female patient who presents for/with See problem oriented charting Chief Complaint  Patient presents with  . Diabetes   This visit occurred during the SARS-CoV-2 public health emergency.  Safety protocols were in place, including screening questions prior to the visit, additional usage of staff PPE, and extensive cleaning of exam room while observing appropriate contact time as indicated for disinfecting solutions.   Past Medical History-  Patient Active Problem List   Diagnosis Date Noted  . DM (diabetes mellitus) type II controlled, neurological manifestation ([place]) 02/24/2014    Priority: High  . Tobacco abuse 02/24/2014    Priority: High  . Osteopenia 03/30/2014    Priority: Medium  . Hypertension associated with diabetes ([place]) 03/08/2014    Priority: Medium  . Hyperlipidemia associated with type 2 diabetes mellitus ([place]) 02/24/2014    Priority: Medium  . Aortic atherosclerosis ([place]) 07/08/2017    Priority: Low  . Diabetic peripheral neuropathy ([place]) 10/27/2014    Priority: Low  . Seasonal allergies 02/24/2014    Priority: Low    Medications- reviewed and updated Current Outpatient Medications  Medication Sig Dispense Refill  . Accu-Chek FastClix Lancets MISC USE TO CHECK BLOOD SUGARS  3-4 TIMES DAILY. 408 each 3  . ACCU-CHEK SMARTVIEW test strip USE TO TEST BLOOD SUGARS  3-4 TIMES DAILY 400 strip 3  . aspirin 81 MG tablet Take 81 mg by mouth daily.    [name] atorvastatin (LIPITOR) 20 MG tablet Take 1 tablet (20 mg total) by mouth daily. 90 tablet 3  . Blood Glucose Monitoring Suppl (ACCU-CHEK NANO SMARTVIEW) w/Device KIT Use to check blood sugars 3-4 times daily 1 kit 0  . Calcium Carbonate-Vit D-Min (CALCIUM 1200 PO) Take 2 tablets by mouth.    . Cholecalciferol (VITAMIN D3) 2000 UNITS TABS Take 1 tablet by mouth.    . diclofenac Sodium (VOLTAREN) 1 % GEL Apply 2 g  topically 4 (four) times daily. 100 g 3  . fexofenadine (ALLEGRA) 180 MG tablet Take 180 mg by mouth daily.    [name] ibuprofen (ADVIL,MOTRIN) 400 MG tablet Take 400 mg by mouth at bedtime as needed. Reported on 09/04/2015    . lisinopril (ZESTRIL) 5 MG tablet Take 1 tablet (5 mg total) by mouth daily. 90 tablet 3  . Magnesium 500 MG CAPS Take 1 capsule by mouth.    . metFORMIN (GLUCOPHAGE) 1000 MG tablet TAKE 1 TABLET BY MOUTH TWO  TIMES DAILY WITH A MEAL 180 tablet 3  . Multiple Vitamins-Minerals (MULTIVITAMIN PO) Take 1 tablet by mouth.    . Omega-3 Fatty Acids (FISH OIL PO) Take 2 tablets by mouth 2 (two) times daily. 1,000 mg    . OVER THE COUNTER MEDICATION Herbal tea 8 ounces daily    . Probiotic Product (PROBIOTIC DAILY PO) Take 1 tablet by mouth.    . Wheat Dextrin (BENEFIBER PO) Take 1 tablet by mouth.     No current facility-administered medications for this visit.     Objective:  BP 118/64   Pulse 100   Temp 97.6 F (36.4 C) (Temporal)   Ht 5' 9"  (1.753 m)   Wt 185 lb (83.9 kg)   LMP  (LMP Unknown)   SpO2 95%   BMI 27.32 kg/m  Gen: NAD, resting comfortably CV: RRR no murmurs rubs or gallops Lungs: CTAB no crackles, wheeze, rhonchi Abdomen: soft/nontender/nondistended/normal bowel sounds. No rebound or  guarding.  Ext: stable 1+ edema     Assessment and Plan  # Neuropathy  S:Seen by Podiatry and recommended a few new medications.  From my chart message on 10/30/2019: "I saw Dr. Elvina Mattes, Podiatrist on 10/25/19 - Epic on [facility] - He assessed my Type 2 diabetes mellitus with poly neuropathy: Hallux valgus, acquired, bilateral & Hammer toe, left, right & overlapping toes, left, right. He cut my toenails & trimmed calluses on big toes.   For neuropathy pain he suggested: Voltaren (already using every night) Neuropathy Cream with menthol Cymbalta - Duloxetine (liver damage?) Amitryptyline Alpha Lipoid Acid What do you think of these suggestions? Do  I need an  appointment to discuss these suggestions with you? Thanks, Cicilia"  Pain at the moment 8/10 with shoes on and stays there most of the time. Sometimes better if takes a shower.-1.  Alcohol leg not  Diclofenac helps her at least sleep- down to 3/10. Gabapentin didn't help. Ibuprofen nightly but kidneys have been ok so far- also increases cardiac risk- just 468m. Propping legs up helps her pain. Laser therapy and massage helps. Started biofreeze after lunch and she may try twice a day instead.   A/P: after discussion today- she is very concerned about side effects of amitriptyline and and cymbalta. She is interested in trial of alpha lipoic acid.  She is going to trial 600 mg daily -She will continue diclofenac before bed -Biofreeze in the daytime also helpful -Continue laser therapy and massage  #Smoking-strongly encourage cessation-patient still not ready to quit even though everything she has read suggest this would help with her neuropathy as well  Recommended follow up: No follow-ups on file. Future Appointments  Date Time Provider DFrench Lick 12/06/2019  9:00 AM [facility] 1 [facility]  03/20/2020  8:40 AM Kyser Wandel, SBrayton Mars MD [facility]   Lab/Order associations:   ICD-10-CM   1. Controlled type 2 diabetes mellitus with diabetic polyneuropathy, without long-term current use of insulin (HCC)  E11.42   2. Diabetic peripheral neuropathy (HCC)  E11.42     Meds ordered this encounter  Medications  . diclofenac Sodium (VOLTAREN) 1 % GEL    Sig: Apply 2 g topically 4 (four) times daily.    Dispense:  100 g    Refill:  3  . Alpha-Lipoic Acid 600 MG CAPS    Sig: Take 1 capsule (600 mg total) by mouth daily.    Dispense:  30 capsule    Refill:  11    Time Spent: 27 minutes of total time (11:45 AM- 12:12 PM) was spent on the date of the encounter performing the following actions: chart review prior to seeing the patient, obtaining history, performing a medically necessary  exam, counseling on the treatment plan, placing orders, and documenting in our EHR.   Return precautions advised.  SGarret Reddish MD

## 2019-11-19 ENCOUNTER — Other Ambulatory Visit: Payer: Self-pay

## 2019-11-19 ENCOUNTER — Ambulatory Visit: Payer: Medicare PPO | Admitting: Family Medicine

## 2019-11-19 ENCOUNTER — Encounter: Payer: Self-pay | Admitting: Family Medicine

## 2019-11-19 VITALS — BP 118/64 | HR 100 | Temp 97.6°F | Ht 69.0 in | Wt 185.0 lb

## 2019-11-19 DIAGNOSIS — E1142 Type 2 diabetes mellitus with diabetic polyneuropathy: Secondary | ICD-10-CM | POA: Diagnosis not present

## 2019-11-19 MED ORDER — DICLOFENAC SODIUM 1 % EX GEL: 2.0000 g | Freq: Four times a day (QID) | CUTANEOUS | 3 refills | Status: DC

## 2019-11-19 MED ORDER — ALPHA-LIPOIC ACID 600 MG PO CAPS: 600.0000 mg | ORAL_CAPSULE | Freq: Every day | ORAL | 11 refills | Status: DC

## 2019-11-28 ENCOUNTER — Encounter: Payer: Self-pay | Admitting: Family Medicine

## 2019-11-30 DIAGNOSIS — H35363 Drusen (degenerative) of macula, bilateral: Secondary | ICD-10-CM | POA: Diagnosis not present

## 2019-11-30 DIAGNOSIS — E119 Type 2 diabetes mellitus without complications: Secondary | ICD-10-CM | POA: Diagnosis not present

## 2019-11-30 DIAGNOSIS — H25013 Cortical age-related cataract, bilateral: Secondary | ICD-10-CM | POA: Diagnosis not present

## 2019-11-30 DIAGNOSIS — H3562 Retinal hemorrhage, left eye: Secondary | ICD-10-CM | POA: Diagnosis not present

## 2019-11-30 DIAGNOSIS — H2513 Age-related nuclear cataract, bilateral: Secondary | ICD-10-CM | POA: Diagnosis not present

## 2019-11-30 DIAGNOSIS — H43813 Vitreous degeneration, bilateral: Secondary | ICD-10-CM | POA: Diagnosis not present

## 2019-11-30 LAB — HM DIABETES EYE EXAM

## 2019-12-06 ENCOUNTER — Other Ambulatory Visit: Payer: Self-pay

## 2019-12-06 ENCOUNTER — Ambulatory Visit (INDEPENDENT_AMBULATORY_CARE_PROVIDER_SITE_OTHER)
Admission: RE | Admit: 2019-12-06 | Discharge: 2019-12-06 | Disposition: A | Discharge status: Home / Self Care | Payer: Medicare PPO | Source: Ambulatory Visit | Attending: Acute Care | Admitting: Acute Care

## 2019-12-06 ENCOUNTER — Other Ambulatory Visit: Payer: Self-pay | Admitting: Radiology

## 2019-12-06 DIAGNOSIS — F17210 Nicotine dependence, cigarettes, uncomplicated: Secondary | ICD-10-CM

## 2019-12-06 DIAGNOSIS — Z87891 Personal history of nicotine dependence: Secondary | ICD-10-CM

## 2019-12-06 DIAGNOSIS — Z122 Encounter for screening for malignant neoplasm of respiratory organs: Secondary | ICD-10-CM

## 2019-12-09 NOTE — Progress Notes (Signed)

## 2019-12-10 ENCOUNTER — Other Ambulatory Visit: Payer: Self-pay | Admitting: *Deleted

## 2019-12-10 DIAGNOSIS — F17210 Nicotine dependence, cigarettes, uncomplicated: Secondary | ICD-10-CM

## 2019-12-10 DIAGNOSIS — Z87891 Personal history of nicotine dependence: Secondary | ICD-10-CM

## 2020-03-06 ENCOUNTER — Encounter: Payer: Self-pay | Admitting: Family Medicine

## 2020-03-06 NOTE — Telephone Encounter (Signed)
Patient is returning Keba's call

## 2020-03-16 ENCOUNTER — Other Ambulatory Visit: Payer: Self-pay

## 2020-03-16 ENCOUNTER — Ambulatory Visit (INDEPENDENT_AMBULATORY_CARE_PROVIDER_SITE_OTHER): Payer: Medicare PPO

## 2020-03-16 VITALS — Wt 185.0 lb

## 2020-03-16 DIAGNOSIS — Z0000 Encounter for general adult medical examination without abnormal findings: Secondary | ICD-10-CM

## 2020-03-16 NOTE — Patient Instructions (Addendum)
Samantha Snyder , Thank you for taking time to come for your Medicare Wellness Visit. I appreciate your ongoing commitment to your health goals. Please review the following plan we discussed and let me know if I can assist you in the future.   Screening recommendations/referrals: Colonoscopy: Done 01/21/19 Mammogram: Done 04/16/19 Bone Density: Done 04/13/18 Recommended yearly ophthalmology/optometry visit for glaucoma screening and checkup Recommended yearly dental visit for hygiene and checkup  Vaccinations: Influenza vaccine: Up to date Pneumococcal vaccine: Up to date Tdap vaccine: Up to date Shingles vaccine: Completed 5/7 & 01/31/19   Covid-19:Completed 2/21 & 09/08/19  Advanced directives: Copies in chart  Conditions/risks identified: Exercise more   Next appointment: Follow up in one year for your annual wellness visit    Preventive Care 24 Years and Older, Female Preventive care refers to lifestyle choices and visits with your health care provider that can promote health and wellness. What does preventive care include?  A yearly physical exam. This is also called an annual well check.  Dental exams once or twice a year.  Routine eye exams. Ask your health care provider how often you should have your eyes checked.  Personal lifestyle choices, including:  Daily care of your teeth and gums.  Regular physical activity.  Eating a healthy diet.  Avoiding tobacco and drug use.  Limiting alcohol use.  Practicing safe sex.  Taking low-dose aspirin every day.  Taking vitamin and mineral supplements as recommended by your health care provider. What happens during an annual well check? The services and screenings done by your health care provider during your annual well check will depend on your age, overall health, lifestyle risk factors, and family history of disease. Counseling  Your health care provider may ask you questions about your:  Alcohol use.  Tobacco  use.  Drug use.  Emotional well-being.  Home and relationship well-being.  Sexual activity.  Eating habits.  History of falls.  Memory and ability to understand (cognition).  Work and work Statistician.  Reproductive health. Screening  You may have the following tests or measurements:  Height, weight, and BMI.  Blood pressure.  Lipid and cholesterol levels. These may be checked every 5 years, or more frequently if you are over 41 years old.  Skin check.  Lung cancer screening. You may have this screening every year starting at age 28 if you have a 30-pack-year history of smoking and currently smoke or have quit within the past 15 years.  Fecal occult blood test (FOBT) of the stool. You may have this test every year starting at age 8.  Flexible sigmoidoscopy or colonoscopy. You may have a sigmoidoscopy every 5 years or a colonoscopy every 10 years starting at age 86.  Hepatitis C blood test.  Hepatitis B blood test.  Sexually transmitted disease (STD) testing.  Diabetes screening. This is done by checking your blood sugar (glucose) after you have not eaten for a while (fasting). You may have this done every 1-3 years.  Bone density scan. This is done to screen for osteoporosis. You may have this done starting at age 71.  Mammogram. This may be done every 1-2 years. Talk to your health care provider about how often you should have regular mammograms. Talk with your health care provider about your test results, treatment options, and if necessary, the need for more tests. Vaccines  Your health care provider may recommend certain vaccines, such as:  Influenza vaccine. This is recommended every year.  Tetanus, diphtheria, and acellular  pertussis (Tdap, Td) vaccine. You may need a Td booster every 10 years.  Zoster vaccine. You may need this after age 19.  Pneumococcal 13-valent conjugate (PCV13) vaccine. One dose is recommended after age 42.  Pneumococcal  polysaccharide (PPSV23) vaccine. One dose is recommended after age 45. Talk to your health care provider about which screenings and vaccines you need and how often you need them. This information is not intended to replace advice given to you by your health care provider. Make sure you discuss any questions you have with your health care provider. Document Released: 07/07/2015 Document Revised: 02/28/2016 Document Reviewed: 04/11/2015 Elsevier Interactive Patient Education  2017 [place] Prevention in the Home Falls can cause injuries. They can happen to people of all ages. There are many things you can do to make your home safe and to help prevent falls. What can I do on the outside of my home?  Regularly fix the edges of walkways and driveways and fix any cracks.  Remove anything that might make you trip as you walk through a door, such as a raised step or threshold.  Trim any bushes or trees on the path to your home.  Use bright outdoor lighting.  Clear any walking paths of anything that might make someone trip, such as rocks or tools.  Regularly check to see if handrails are loose or broken. Make sure that both sides of any steps have handrails.  Any raised decks and porches should have guardrails on the edges.  Have any leaves, snow, or ice cleared regularly.  Use sand or salt on walking paths during winter.  Clean up any spills in your garage right away. This includes oil or grease spills. What can I do in the bathroom?  Use night lights.  Install grab bars by the toilet and in the tub and shower. Do not use towel bars as grab bars.  Use non-skid mats or decals in the tub or shower.  If you need to sit down in the shower, use a plastic, non-slip stool.  Keep the floor dry. Clean up any water that spills on the floor as soon as it happens.  Remove soap buildup in the tub or shower regularly.  Attach bath mats securely with double-sided non-slip rug  tape.  Do not have throw rugs and other things on the floor that can make you trip. What can I do in the bedroom?  Use night lights.  Make sure that you have a light by your bed that is easy to reach.  Do not use any sheets or blankets that are too big for your bed. They should not hang down onto the floor.  Have a firm chair that has side arms. You can use this for support while you get dressed.  Do not have throw rugs and other things on the floor that can make you trip. What can I do in the kitchen?  Clean up any spills right away.  Avoid walking on wet floors.  Keep items that you use a lot in easy-to-reach places.  If you need to reach something above you, use a strong step stool that has a grab bar.  Keep electrical cords out of the way.  Do not use floor polish or wax that makes floors slippery. If you must use wax, use non-skid floor wax.  Do not have throw rugs and other things on the floor that can make you trip. What can I do with my stairs?  Do not leave any items on the stairs.  Make sure that there are handrails on both sides of the stairs and use them. Fix handrails that are broken or loose. Make sure that handrails are as long as the stairways.  Check any carpeting to make sure that it is firmly attached to the stairs. Fix any carpet that is loose or worn.  Avoid having throw rugs at the top or bottom of the stairs. If you do have throw rugs, attach them to the floor with carpet tape.  Make sure that you have a light switch at the top of the stairs and the bottom of the stairs. If you do not have them, ask someone to add them for you. What else can I do to help prevent falls?  Wear shoes that:  Do not have high heels.  Have rubber bottoms.  Are comfortable and fit you well.  Are closed at the toe. Do not wear sandals.  If you use a stepladder:  Make sure that it is fully opened. Do not climb a closed stepladder.  Make sure that both sides of the  stepladder are locked into place.  Ask someone to hold it for you, if possible.  Clearly mark and make sure that you can see:  Any grab bars or handrails.  First and last steps.  Where the edge of each step is.  Use tools that help you move around (mobility aids) if they are needed. These include:  Canes.  Walkers.  Scooters.  Crutches.  Turn on the lights when you go into a dark area. Replace any light bulbs as soon as they burn out.  Set up your furniture so you have a clear path. Avoid moving your furniture around.  If any of your floors are uneven, fix them.  If there are any pets around you, be aware of where they are.  Review your medicines with your doctor. Some medicines can make you feel dizzy. This can increase your chance of falling. Ask your doctor what other things that you can do to help prevent falls. This information is not intended to replace advice given to you by your health care provider. Make sure you discuss any questions you have with your health care provider. Document Released: 04/06/2009 Document Revised: 11/16/2015 Document Reviewed: 07/15/2014 Elsevier Interactive Patient Education  2017 [employer].

## 2020-03-16 NOTE — Progress Notes (Signed)
Virtual Visit via Telephone Note  I connected with  Samantha Snyder on 03/16/20 at  8:00 AM EDT by telephone and verified that I am speaking with the correct person using two identifiers.  Medicare Annual Wellness visit completed telephonically due to Covid-19 pandemic.   Persons participating in this call: This Health Coach and this patient.   Location: Patient: Home Provider: Office   I discussed the limitations, risks, security and privacy concerns of performing an evaluation and management service by telephone and the availability of in person appointments. The patient expressed understanding and agreed to proceed.  Unable to perform video visit due to video visit attempted and failed and/or patient does not have video capability.   Some vital signs may be absent or patient reported.   Willette Brace, LPN    Subjective:   Samantha Snyder is a 74 y.o. female who presents for Medicare Annual (Subsequent) preventive examination.  Review of Systems     Cardiac Risk Factors include: advanced age (>59mn, >>39women);diabetes mellitus;dyslipidemia;hypertension;smoking/ tobacco exposure     Objective:    Today's Vitals   03/16/20 0945  Weight: 185 lb (83.9 kg)   Body mass index is 27.32 kg/m.  Advanced Directives 03/16/2020 03/15/2019 03/12/2018 03/11/2017 12/21/2015 12/07/2015 11/01/2015  Does Patient Have a Medical Advance Directive? _0  Yes Yes  Type of [place]amed[place]of [place] will Living will;Healthcare Power of [place] will  Does patient want to make changes to medical advance directive? - No - Patient declined - - - - -  Copy of [place] Chart? Yes - validated most recent copy scanned in chart (See row information) Yes - validated most recent copy scanned in chart (See row information) - - No - copy requested - -     Current Medications (verified) Outpatient Encounter Medications as of 03/16/2020  Medication Sig  . Accu-Chek FastClix Lancets MISC USE TO CHECK BLOOD SUGARS  3-4 TIMES DAILY.  .Marland KitchenACCU-CHEK SMARTVIEW test strip USE TO TEST BLOOD SUGARS  3-4 TIMES DAILY  . Alpha-Lipoic Acid 600 MG CAPS Take 1 capsule (600 mg total) by mouth daily.  .Marland Kitchenaspirin 81 MG tablet Take 81 mg by mouth daily.  .Marland Kitchenatorvastatin (LIPITOR) 20 MG tablet Take 1 tablet (20 mg total) by mouth daily.  . Blood Glucose Monitoring Suppl (ACCU-CHEK NANO SMARTVIEW) w/Device KIT Use to check blood sugars 3-4 times daily  . Calcium Carbonate-Vit D-Min (CALCIUM 1200 PO) Take 2 tablets by mouth.  . Cholecalciferol (VITAMIN D3) 2000 UNITS TABS Take 1 tablet by mouth.  . diclofenac Sodium (VOLTAREN) 1 % GEL Apply 2 g topically 4 (four) times daily.  . fexofenadine (ALLEGRA) 180 MG tablet Take 180 mg by mouth daily.  .Marland Kitchenibuprofen (ADVIL,MOTRIN) 400 MG tablet Take 400 mg by mouth at bedtime as needed. Reported on 09/04/2015  . lisinopril (ZESTRIL) 5 MG tablet Take 1 tablet (5 mg total) by mouth daily.  . Magnesium 500 MG CAPS Take 1 capsule by mouth.  . metFORMIN (GLUCOPHAGE) 1000 MG tablet TAKE 1 TABLET BY MOUTH TWO  TIMES DAILY WITH A MEAL  . Multiple Vitamins-Minerals (MULTIVITAMIN PO) Take 1 tablet by mouth.  . Omega-3 Fatty Acids (FISH OIL PO) Take 2 tablets by mouth 2 (two) times daily. 1,000 mg  . OVER THE COUNTER MEDICATION Herbal tea 8 ounces daily  . Probiotic Product (PROBIOTIC DAILY PO)  Take 1 tablet by mouth.  . Wheat Dextrin (BENEFIBER PO) Take 1 tablet by mouth.   No facility-administered encounter medications on file as of 03/16/2020.    Allergies (verified) Patient has no known allergies.   History: Past Medical History:  Diagnosis Date  . Allergy    minor hay fever  . Arthritis    hands x 2, knee   . Cataract    small  . Chicken pox 02/24/2014   Has had shingles vaccine  . Chronic diarrhea    For one  year-needed prolonged course of antibiotics  . Diabetes mellitus without complication ([place])   . Diverticulosis 02/24/2014  . Emphysema of lung ([place])    mild per pt. per x ray   . History of UTI 2014   per pt noted after taking Bactrim due to tooth infection  . Hyperlipidemia   . Neuromuscular disorder ([facility])    neuropathy both feet   . Osteopenia   . 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 Vaginal prolapse    Status post surgery. Improved urinary incontinence   Past Surgical History:  Procedure Laterality Date  . ABDOMINAL HYSTERECTOMY  09-07-2013  . ABDOMINAL SACROCOLPOPEXY  09-07-2013  . BLADDER SURGERY  09/07/2013   vaginal prolapse  . COLONOSCOPY  11-06-2005   sig tics, no polyps-[facility] [place]   . CYSTOSCOPY  09-07-2013  . ENTEROCELE REPAIR  09-07-2013  . INDUCED ABORTION  1985   Therapeutic  . LAPAROSCOPIC BILATERAL SALPINGO OOPHERECTOMY  09-07-2013  . POLYPECTOMY    . PUBOVAGINAL SLING  09-07-2013  . TONSILLECTOMY Bilateral 1955  . TUBAL LIGATION     Family History  Problem Relation Age of Onset  . Cancer Father        Lung but was a smoker  . Stroke Father        6 massive lead to death  . Diabetes Father   . Bladder Cancer Father   . Lung cancer Father   . Osteoporosis Mother   . Anuerysm Mother   . Diabetes Maternal Grandmother   . Colon cancer Neg Hx   . Colon polyps Neg Hx   . Esophageal cancer Neg Hx   . Rectal cancer Neg Hx   . Stomach cancer Neg Hx   . Breast cancer Neg Hx    Social History   Socioeconomic History  . Marital status: Married    Spouse name: Not on file  . Number of children: Not on file  . Years of education: Not on file  . Highest education level: Not on file  Occupational History  . Occupation: retired  Tobacco Use  . Smoking status: Current Every Day Smoker    Packs/day: 1.00    Years: 53.00    Pack years: 53.00    Types: Cigarettes  . Smokeless tobacco: Never Used  . Tobacco comment: Counseled that  the single most powerful action she could take to decrease her risk of lung cancer is to quit smoking  Substance and Sexual Activity  . Alcohol use: No    Alcohol/week: 0.0 standard drinks  . Drug use: No  . Sexual activity: Not on file  Other Topics Concern  . Not on file  Social History Narrative   Married 45 years in 2015.  Moved to [place] to be near daughter who lives here. Has a son as well and 79 year old grandson. 3 granddogs. One dog at home  Retired former  Academic librarian. Has a BS in biology from [employer]. Used to be an algologist-studied algae under microscope      Hobbies-exercising with her husband      Social Determinants of Radio broadcast assistant Strain: Low Risk   . Difficulty of Paying Living Expenses: Not hard at all  Food Insecurity: No Food Insecurity  . Worried About Charity fundraiser in the Last Year: Never true  . Ran Out of Food in the Last Year: Never true  Transportation Needs: No Transportation Needs  . Lack of Transportation (Medical): No  . Lack of Transportation (Non-Medical): No  Physical Activity: Inactive  . Days of Exercise per Week: 0 days  . Minutes of Exercise per Session: 0 min  Stress: No Stress Concern Present  . Feeling of Stress : Not at all  Social Connections: Moderately Isolated  . Frequency of Communication with Friends and Family: More than three times a week  . Frequency of Social Gatherings with Friends and Family: More than three times a week  . Attends Religious Services: Never  . Active Member of Clubs or Organizations: No  . Attends Archivist Meetings: Never  . Marital Status: Married    Tobacco Counseling Ready to quit: Not Answered Counseling given: Not Answered Comment: Counseled that the single most powerful action she could take to decrease her risk of lung cancer is to quit smoking   Clinical Intake:  Pre-visit preparation completed: Yes  Pain : No/denies pain     BMI -  recorded: 27.32 Nutritional Status: BMI 25 -29 Overweight Nutritional Risks: None Diabetes: Yes CBG done?: Yes (128) CBG resulted in Enter/ Edit results?: No Did pt. bring in CBG monitor from home?: No  How often do you need to have someone help you when you read instructions, pamphlets, or other written materials from your doctor or pharmacy?: 1 - Never  Diabetic?yes  Interpreter Needed?: No  Information entered by :: Charlott Rakes, LPN   Activities of Daily Living In your present state of health, do you have any difficulty performing the following activities: 03/16/2020  Hearing? N  Vision? N  Difficulty concentrating or making decisions? N  Walking or climbing stairs? N  Dressing or bathing? N  Doing errands, shopping? N  Preparing Food and eating ? N  Using the Toilet? N  In the past six months, have you accidently leaked urine? N  Do you have problems with loss of bowel control? N  Managing your Medications? N  Managing your Finances? N  Housekeeping or managing your Housekeeping? N  Some recent data might be hidden    Patient Care Team: Marin Olp, MD as PCP - General (Family Medicine) Celestia Khat, [place] as Consulting Physician (Optometry) Gerda Diss, DO as Consulting Physician (Sports Medicine)  Indicate any recent Medical Services you may have received from other than [facility] providers in the past year (date may be approximate).     Assessment:   This is a routine wellness examination for Mikiyah.  Hearing/Vision screen  Hearing Screening   _0  _1  _2  _3  _4  _5  _6  _7  _8   Right ear:           Left ear:           Comments: Pt denies any difficulty  Vision Screening Comments: Pt follows annually [facility]  Dietary issues and exercise activities discussed: Current Exercise Habits: The patient does not participate in regular exercise at  present (does gardening and is very active)  Goals    . Patient  Stated     Do more exercise    . Quit Smoking     Is trying to quit smoking Evaluating different program     . Quit smoking / using tobacco     Smoking;  Educated to avoid secondary smoke  Classes offered a couple of times a month;  Will work with the patient as far as registration and location  Meds may help; chatix (Varenicline); Zyban (Bupropion SR); Nicotine Replacement (gum; lozenges; patches; etc.)   30 pack yr smoking hx: Educated regarding LDCT; To discuss with MD at next fup. Also educated on AAA screening for men 65-75 who have smoked  Try to start thinking of your without cigarettes  I can quit         Depression Screen PHQ 2/9 Scores 03/16/2020 07/19/2019 03/15/2019 12/29/2018 03/12/2018 03/12/2018 03/11/2017  PHQ - 2 Score 0 0 0 0 0 0 0    Fall Risk Fall Risk  03/16/2020 11/19/2019 03/15/2019 03/12/2018 03/12/2018  Falls in the past year? 0 0 1 No No  Number falls in past yr: 0 0 0 - -  Injury with Fall? 0 0 1 - -  Risk for fall due to : Impaired vision;Impaired mobility;Impaired balance/gait - - - -  Risk for fall due to: Comment related to neuropathy at time - - - -  Follow up Falls prevention discussed - - - -  Comment - - - - -    Any stairs in or around the home? No  If so, are there any without handrails? No  Home free of loose throw rugs in walkways, pet beds, electrical cords, etc? Yes  Adequate lighting in your home to reduce risk of falls? Yes   ASSISTIVE DEVICES UTILIZED TO PREVENT FALLS:  Life alert? No  Use of a cane, walker or w/c? No  Grab bars in the bathroom? Yes  Shower chair or bench in shower? No  Elevated toilet seat or a handicapped toilet? No   TIMED UP AND GO:  Was the test performed? No .     Cognitive Function: MMSE - Mini Mental State Exam 03/12/2018 03/11/2017  Not completed: (No Data) (No Data)     6CIT Screen 03/16/2020 03/15/2019  What Year? 0 points 0 points  What month? 0 points 0 points  What time? - 0 points  Count  back from 20 0 points 0 points  Months in reverse 0 points 0 points  Repeat phrase 0 points 0 points  Total Score - 0    Immunizations Immunization History  Administered Date(s) Administered  . Fluad Quad(high Dose 65+) 03/15/2019  . Hepatitis A 02/28/1948  . Hepatitis B 03/28/1947  . Influenza, High Dose Seasonal PF 03/17/2014, 03/07/2016, 03/11/2017, 03/12/2018  . Influenza,inj,Quad PF,6+ Mos 03/02/2015  . Influenza-Unspecified 03/10/2010, 02/25/2012, 03/17/2013  . MMR 05/14/1990, 05/14/1992  . PFIZER SARS-COV-2 Vaccination 08/15/2019, 09/08/2019  . Pneumococcal Conjugate-13 03/17/2013, 06/07/2013  . Pneumococcal Polysaccharide-23 09/19/2005, 02/24/2014  . Pneumococcal-Unspecified 09/19/2005  . Td 02/28/1950, 02/29/1956, 09/19/2005, 03/07/2016  . Tdap 02/29/1960  . Varicella 10/19/1991  . Zoster 02/28/2009  . Zoster Recombinat (Shingrix) 10/28/2016, 01/30/2017    TDAP status: Up to date Flu Vaccine status: Up to date Pneumococcal vaccine status: Up to date Covid-19 vaccine status: Completed vaccines  Qualifies for Shingles Vaccine? Yes   Zostavax completed Yes   Shingrix Completed?: Yes  Screening Tests Health Maintenance  Topic Date  Due  . INFLUENZA VACCINE  01/23/2020  . HEMOGLOBIN A1C  03/17/2020  . OPHTHALMOLOGY EXAM  06/01/2020  . FOOT EXAM  09/14/2020  . MAMMOGRAM  04/15/2021  . COLONOSCOPY  01/21/2024  . TETANUS/TDAP  03/07/2026  . DEXA SCAN  Completed  . COVID-19 Vaccine  Completed  . Hepatitis C Screening  Completed  . PNA vac Low Risk Adult  Completed    Health Maintenance  Health Maintenance Due  Topic Date Due  . INFLUENZA VACCINE  01/23/2020    Colonoscopy done 01/21/19 5 years repeat Mammogram status: Completed 04/16/19. Repeat every year Bone Density status: Completed 04/13/18. Results reflect: Bone density results: OSTEOPENIA. Repeat every 2 years.  Additional Screening:  Hepatitis C Screening: Completed 03/02/15  Vision Screening:  Recommended annual ophthalmology exams for early detection of glaucoma and other disorders of the eye. Is the patient up to date with their annual eye exam?  Yes  Who is the provider or what is the name of the office in which the patient attends annual eye exams? [facility]   Dental Screening: Recommended annual dental exams for proper oral hygiene  Community Resource Referral / Chronic Care Management: CRR required this visit?  No   CCM required this visit?  No      Plan:     I have personally reviewed and noted the following in the patient's chart:   . Medical and social history . Use of alcohol, tobacco or illicit drugs  . Current medications and supplements . Functional ability and status . Nutritional status . Physical activity . Advanced directives . List of other physicians . Hospitalizations, surgeries, and ER visits in previous 12 months . Vitals . Screenings to include cognitive, depression, and falls . Referrals and appointments  In addition, I have reviewed and discussed with patient certain preventive protocols, quality metrics, and best practice recommendations. A written personalized care plan for preventive services as well as general preventive health recommendations were provided to patient.     Willette Brace, LPN   08/23/402   Nurse Notes: None

## 2020-03-17 DIAGNOSIS — Z7984 Long term (current) use of oral hypoglycemic drugs: Secondary | ICD-10-CM | POA: Diagnosis not present

## 2020-03-17 DIAGNOSIS — I10 Essential (primary) hypertension: Secondary | ICD-10-CM | POA: Diagnosis not present

## 2020-03-17 DIAGNOSIS — Z809 Family history of malignant neoplasm, unspecified: Secondary | ICD-10-CM | POA: Diagnosis not present

## 2020-03-17 DIAGNOSIS — M1990 Unspecified osteoarthritis, unspecified site: Secondary | ICD-10-CM | POA: Diagnosis not present

## 2020-03-17 DIAGNOSIS — I951 Orthostatic hypotension: Secondary | ICD-10-CM | POA: Diagnosis not present

## 2020-03-17 DIAGNOSIS — Z720 Tobacco use: Secondary | ICD-10-CM | POA: Diagnosis not present

## 2020-03-17 DIAGNOSIS — E1136 Type 2 diabetes mellitus with diabetic cataract: Secondary | ICD-10-CM | POA: Diagnosis not present

## 2020-03-17 DIAGNOSIS — E1142 Type 2 diabetes mellitus with diabetic polyneuropathy: Secondary | ICD-10-CM | POA: Diagnosis not present

## 2020-03-17 DIAGNOSIS — E785 Hyperlipidemia, unspecified: Secondary | ICD-10-CM | POA: Diagnosis not present

## 2020-03-19 NOTE — Progress Notes (Signed)
Phone [phone]   Subjective:  Patient presents today for their annual physical. Chief complaint-noted.   See problem oriented charting- Review of Systems  Constitutional: Negative for chills and fever.  HENT: Negative for nosebleeds and sore throat.   Eyes: Negative for blurred vision and double vision.  Respiratory: Negative for cough and shortness of breath.   Cardiovascular: Negative for chest pain and palpitations.  Gastrointestinal: Negative for constipation, diarrhea, heartburn, nausea and vomiting.  Genitourinary: Negative for dysuria and frequency.  Musculoskeletal: Positive for joint pain (neuropathy related. seeing laser treatment). Negative for myalgias.  Skin: Negative for itching and rash.  Neurological: Negative for dizziness and headaches.  Endo/Heme/Allergies: Negative for polydipsia. Does not bruise/bleed easily.  Psychiatric/Behavioral: Negative for depression, substance abuse and suicidal ideas.   The following were reviewed and entered/updated in epic: Past Medical History:  Diagnosis Date   Allergy    minor hay fever   Arthritis    hands x 2, knee    Cataract    small   Chicken pox 02/24/2014   Has had shingles vaccine   Chronic diarrhea    For one year-needed prolonged course of antibiotics   Diabetes mellitus without complication ([place])    Diverticulosis 02/24/2014   Emphysema of lung (HCC)    mild per pt. per x ray    History of UTI 2014   per pt noted after taking Bactrim due to tooth infection   Hyperlipidemia    Neuromuscular disorder (HCC)    neuropathy both feet    Osteopenia    Right adrenal mass ([place])    Biopsied in 2008 and benign   Vaginal prolapse    Status post surgery. Improved urinary incontinence   Patient Active Problem List   Diagnosis Date Noted   DM (diabetes mellitus) type II controlled, neurological manifestation ([place]) 02/24/2014    Priority: High   Tobacco abuse 02/24/2014    Priority: High    Osteopenia 03/30/2014    Priority: Medium   Hypertension associated with diabetes ([place]) 03/08/2014    Priority: Medium   Hyperlipidemia associated with type 2 diabetes mellitus ([place]) 02/24/2014    Priority: Medium   Aortic atherosclerosis ([place]) 07/08/2017    Priority: Low   Diabetic peripheral neuropathy ([place]) 10/27/2014    Priority: Low   Seasonal allergies 02/24/2014    Priority: Low   Past Surgical History:  Procedure Laterality Date   ABDOMINAL HYSTERECTOMY  09-07-2013   ABDOMINAL SACROCOLPOPEXY  09-07-2013   BLADDER SURGERY  09/07/2013   vaginal prolapse   COLONOSCOPY  11-06-2005   sig tics, no polyps-[facility]    CYSTOSCOPY  09-07-2013   ENTEROCELE REPAIR  09-07-2013   INDUCED ABORTION  1985   Therapeutic   LAPAROSCOPIC BILATERAL SALPINGO OOPHERECTOMY  09-07-2013   POLYPECTOMY     PUBOVAGINAL SLING  09-07-2013   TONSILLECTOMY Bilateral 1955   TUBAL LIGATION      Family History  Problem Relation Age of Onset   Cancer Father        Lung but was a smoker   Stroke Father        22 massive lead to death   Diabetes Father    Bladder Cancer Father    Lung cancer Father    Osteoporosis Mother    Anuerysm Mother    Diabetes Maternal Grandmother    Colon cancer Neg Hx    Colon polyps Neg Hx    Esophageal cancer Neg Hx    Rectal cancer Neg Hx  Stomach cancer Neg Hx    Breast cancer Neg Hx     Medications- reviewed and updated Current Outpatient Medications  Medication Sig Dispense Refill   Accu-Chek FastClix Lancets MISC USE TO CHECK BLOOD SUGARS  3-4 TIMES DAILY. 408 each 3   ACCU-CHEK SMARTVIEW test strip USE TO TEST BLOOD SUGARS  3-4 TIMES DAILY 400 strip 3   Alpha-Lipoic Acid 600 MG CAPS Take 1 capsule (600 mg total) by mouth daily. 30 capsule 11   aspirin 81 MG tablet Take 81 mg by mouth daily.     atorvastatin (LIPITOR) 20 MG tablet Take 1 tablet (20 mg total) by mouth daily. 90 tablet 3   Blood Glucose Monitoring  Suppl (ACCU-CHEK NANO SMARTVIEW) w/Device KIT Use to check blood sugars 3-4 times daily 1 kit 0   Calcium Carbonate-Vit D-Min (CALCIUM 1200 PO) Take 2 tablets by mouth.     Cholecalciferol (VITAMIN D3) 2000 UNITS TABS Take 1 tablet by mouth.     diclofenac Sodium (VOLTAREN) 1 % GEL Apply 2 g topically 4 (four) times daily. 350 g 11   fexofenadine (ALLEGRA) 180 MG tablet Take 180 mg by mouth daily.     ibuprofen (ADVIL,MOTRIN) 400 MG tablet Take 400 mg by mouth at bedtime as needed. Reported on 09/04/2015     lisinopril (ZESTRIL) 5 MG tablet Take 1 tablet (5 mg total) by mouth daily. 90 tablet 3   Magnesium 500 MG CAPS Take 1 capsule by mouth.     metFORMIN (GLUCOPHAGE) 1000 MG tablet TAKE 1 TABLET BY MOUTH TWO  TIMES DAILY WITH A MEAL 180 tablet 3   Multiple Vitamins-Minerals (MULTIVITAMIN PO) Take 1 tablet by mouth.     Omega-3 Fatty Acids (FISH OIL PO) Take 2 tablets by mouth 2 (two) times daily. 1,000 mg     OVER THE COUNTER MEDICATION Herbal tea 8 ounces daily     Probiotic Product (PROBIOTIC DAILY PO) Take 1 tablet by mouth.     Wheat Dextrin (BENEFIBER PO) Take 1 tablet by mouth.     No current facility-administered medications for this visit.    Allergies-reviewed and updated No Known Allergies  Social History   Social History Narrative   Married 45 years in 2015.  Moved to [place] to be near daughter who lives here. Has a son as well and 68 year old grandson. 3 granddogs. One dog at home       Retired former  Academic librarian. Has a BS in biology from [employer]. Used to be an algologist-studied algae under microscope      Hobbies-exercising with her husband      Objective  Objective:  BP 124/80    Pulse 75    Temp 97.9 F (36.6 C) (Temporal)    Ht 5' 9"  (1.753 m)    Wt 194 lb (88 kg)    LMP  (LMP Unknown)    SpO2 91%    BMI 28.65 kg/m  Gen: NAD, resting comfortably HEENT: Mucous membranes are moist. Oropharynx normal Neck: no thyromegaly CV: RRR no  murmurs rubs or gallops Lungs: CTAB no crackles, wheeze, rhonchi Abdomen: soft/nontender/nondistended/normal bowel sounds. No rebound or guarding.  Ext: no edema Skin: warm, dry Neuro: grossly normal, moves all extremities, PERRLA   Assessment and Plan   74 y.o. female presenting for annual physical.  Health Maintenance counseling: 1. Anticipatory guidance: Patient counseled regarding regular dental exams -q4 months, eye exams - yearly,  avoiding smoking and second hand smoke- encouraged quitting , limiting  alcohol to 1 beverage per day- does not drink .   2. Risk factor reduction:  Advised patient of need for regular exercise and diet rich and fruits and vegetables to reduce risk of heart attack and stroke. Exercise- walks twice a week for 30 minutes. Diet- weigh tup 9 lbs from may- encouraged tweaking diet- focus more on fruits/veggies and fewer processed foods/portion size.  Wt Readings from Last 3 Encounters:  03/20/20 194 lb (88 kg)  03/16/20 185 lb (83.9 kg)  11/19/19 185 lb (83.9 kg)  3. Immunizations/screenings/ancillary studies- high dose folu shot today Immunization History  Administered Date(s) Administered   Fluad Quad(high Dose 65+) 03/15/2019   Hepatitis A 02/28/1948   Hepatitis B 03[birth year]28[birth year]1949   Influenza, High Dose Seasonal PF 03/17/2014, 03/07/2016, 03/11/2017, 03/12/2018   Influenza,inj,Quad PF,6+ Mos 03/02/2015   Influenza-Unspecified 03/10/2010, 02/25/2012, 03/17/2013   MMR 05/14/1990, 05/14/1992   PFIZER SARS-COV-2 Vaccination 08/15/2019, 09/08/2019   Pneumococcal Conjugate-13 03/17/2013, 06/07/2013   Pneumococcal Polysaccharide-23 09/19/2005, 02/24/2014   Pneumococcal-Unspecified 09/19/2005   Td 02/28/1950, 02/29/1956, 09/19/2005, 03/07/2016   Tdap 02/29/1960   Varicella 10/19/1991   Zoster 02/28/2009   Zoster Recombinat (Shingrix) 10/28/2016, 01/30/2017  4. Cervical cancer screening- past age based screening recommendations 5. Breast cancer  screening-  breast exam declined- prefers only mammogram -  04/16/2019 and ordered for next month 6. Colon cancer screening - 01/21/2019 with 5 year repeat due to adenoma  7. Skin cancer screening- no dermatologist. advised regular sunscreen use. Denies worrisome, changing, or new skin lesions.  8. Birth control/STD check- postmenopausal, monogomous 9. Osteoporosis screening at 56- see below -curret smoker- see below  Status of acute/chronic conditions:   #hypertension S: medication: Lisinopril 5Mg Home readings #s: does not check A/P: Stable. Continue current medications.   #hyperlipidemia #Aortic atherosclerosis-LDL goal under 70.  Incidental finding on imaging S: Medication: Atorvastatin 20Mg, aspirin 81 mg for primary prevention Lab Results  Component Value Date   CHOL 130 03/15/2019   HDL 40.40 03/15/2019   LDLCALC 53 03/15/2019   LDLDIRECT 73.0 03/11/2017   TRIG 182.0 (H) 03/15/2019   CHOLHDL 3 03/15/2019   A/P:  Hopefully stable- update lipid panel today  # Diabetes-A1c typically under 7 #Diabetic neuropathy-alpha lipoic acid generally helpful (having issues with backorder- may check with mail order), also voltaren helps.  Requires diabetic shoes/insoles- due in march S: Medication: Metformin 1000Mg twice daily CBGs- 130s instead of 120s Exercise and diet- trying to do exercise by walking maybe twice a week, active in garden,  A/P: hopefully stable a1c- update today and continue metformin as above  For neuropathy- continue alpha lipoic acid and voltaren  #Tobacco abuse S: 1 PPD still  A/P: strongly encouraged cessation  -enrolled in lung cancer screening program  # Low Bone density (formerly osteopenia) S: Last DEXA: 04/13/2018 worst T score -1.8 at lumbar spine  Calcium: 1230m (through diet ok) recommended  Vitamin D: 1000 units a day recommended  Encouraged regular walking  A/P: hopefully stable- update bone density after 04/13/18 for 2 year repeat. Continue  calcium, vitamin D. Increase walking.    Recommended follow up: Return in about 6 months (around 09/17/2020) for physical or sooner if needed.   Future Appointments  Date Time Provider [place] 03/22/2021  9:30 AM [facility]    Lab/Order associations:   ICD-10-CM   1. Hypertension associated with diabetes ([place]  E11.59    I10   2. Controlled type 2 diabetes mellitus with diabetic polyneuropathy, without  long-term current use of insulin (HCC)  E11.42 POCT glycosylated hemoglobin (Hb A1C)  3. Hyperlipidemia associated with type 2 diabetes mellitus ([place])  E11.69    E78.5     No orders of the defined types were placed in this encounter.   Return precautions advised.  Garret Reddish, MD

## 2020-03-19 NOTE — Patient Instructions (Addendum)
Health Maintenance Due  Topic Date Due  . INFLUENZA VACCINE In office flu shot high dose 01/23/2020   Please stop by lab before you go If you have mychart- we will send your results within 3 business days of [place] receiving them.  If you do not have mychart- we will call you about results within 5 business days of [place] receiving them.  *please note we are currently using [facility] which has a longer processing time than [place] typically so labs may not come back as quickly as in the past *please also note that you will see labs on mychart as soon as they post. I will later go in and write notes on them- will say "notes from Dr. Yong Channel"  Please please please quit smoking- one of the best things you could do for your health  Increase walking to at least 3 days a week up from 2 days a week  Call breast center for mammogram and dexa after 04/15/20

## 2020-03-20 ENCOUNTER — Ambulatory Visit (INDEPENDENT_AMBULATORY_CARE_PROVIDER_SITE_OTHER): Payer: Medicare PPO | Admitting: Family Medicine

## 2020-03-20 ENCOUNTER — Other Ambulatory Visit: Payer: Self-pay

## 2020-03-20 ENCOUNTER — Encounter: Payer: Self-pay | Admitting: Family Medicine

## 2020-03-20 VITALS — BP 124/80 | HR 75 | Temp 97.9°F | Ht 69.0 in | Wt 194.0 lb

## 2020-03-20 DIAGNOSIS — E1142 Type 2 diabetes mellitus with diabetic polyneuropathy: Secondary | ICD-10-CM

## 2020-03-20 DIAGNOSIS — Z1239 Encounter for other screening for malignant neoplasm of breast: Secondary | ICD-10-CM | POA: Diagnosis not present

## 2020-03-20 DIAGNOSIS — M8588 Other specified disorders of bone density and structure, other site: Secondary | ICD-10-CM

## 2020-03-20 DIAGNOSIS — Z23 Encounter for immunization: Secondary | ICD-10-CM | POA: Diagnosis not present

## 2020-03-20 DIAGNOSIS — Z0000 Encounter for general adult medical examination without abnormal findings: Secondary | ICD-10-CM

## 2020-03-20 DIAGNOSIS — E1169 Type 2 diabetes mellitus with other specified complication: Secondary | ICD-10-CM

## 2020-03-20 DIAGNOSIS — I10 Essential (primary) hypertension: Secondary | ICD-10-CM | POA: Diagnosis not present

## 2020-03-20 DIAGNOSIS — E785 Hyperlipidemia, unspecified: Secondary | ICD-10-CM

## 2020-03-20 DIAGNOSIS — Z720 Tobacco use: Secondary | ICD-10-CM | POA: Diagnosis not present

## 2020-03-20 DIAGNOSIS — E1159 Type 2 diabetes mellitus with other circulatory complications: Secondary | ICD-10-CM

## 2020-03-20 DIAGNOSIS — I152 Hypertension secondary to endocrine disorders: Secondary | ICD-10-CM

## 2020-03-20 LAB — POC URINALSYSI DIPSTICK (AUTOMATED)
Bilirubin, UA: NEGATIVE
Blood, UA: NEGATIVE
Glucose, UA: NEGATIVE
Ketones, UA: NEGATIVE
Leukocytes, UA: NEGATIVE
Nitrite, UA: POSITIVE
Protein, UA: NEGATIVE
Spec Grav, UA: 1.02 (ref 1.010–1.025)
Urobilinogen, UA: 1 E.U./dL
pH, UA: 7 (ref 5.0–8.0)

## 2020-03-20 MED ORDER — DICLOFENAC SODIUM 1 % EX GEL: 2.0000 g | Freq: Four times a day (QID) | CUTANEOUS | 11 refills | Status: DC

## 2020-03-20 NOTE — Addendum Note (Signed)
Addended by: Thomes Cake on: 03/20/2020 09:21 AM   Modules accepted: Orders

## 2020-03-20 NOTE — Addendum Note (Signed)
Addended by: Milton Ferguson D on: 03/20/2020 09:34 AM   Modules accepted: Orders

## 2020-03-20 NOTE — Addendum Note (Signed)
Addended by: Thomes Cake on: 03/20/2020 01:44 PM   Modules accepted: Orders

## 2020-03-21 ENCOUNTER — Other Ambulatory Visit: Payer: Self-pay

## 2020-03-21 ENCOUNTER — Encounter: Payer: Self-pay | Admitting: Family Medicine

## 2020-03-21 LAB — COMPLETE METABOLIC PANEL WITH GFR
AG Ratio: 2.1 (calc) (ref 1.0–2.5)
ALT: 18 U/L (ref 6–29)
AST: 14 U/L (ref 10–35)
Albumin: 4.1 g/dL (ref 3.6–5.1)
Alkaline phosphatase (APISO): 87 U/L (ref 37–153)
BUN/Creatinine Ratio: 32 (calc) — ABNORMAL HIGH (ref 6–22)
BUN: 19 mg/dL (ref 7–25)
CO2: 26 mmol/L (ref 20–32)
Calcium: 9.3 mg/dL (ref 8.6–10.4)
Chloride: 109 mmol/L (ref 98–110)
Creat: 0.59 mg/dL — ABNORMAL LOW (ref 0.60–0.93)
GFR, Est African American: 106 mL/min/{1.73_m2} (ref >=60)
GFR, Est Non African American: 92 mL/min/{1.73_m2} (ref >=60)
Globulin: 2 g/dL (calc) (ref 1.9–3.7)
Glucose, Bld: 143 mg/dL — ABNORMAL HIGH (ref 65–99)
Potassium: 4.3 mmol/L (ref 3.5–5.3)
Sodium: 142 mmol/L (ref 135–146)
Total Bilirubin: 0.4 mg/dL (ref 0.2–1.2)
Total Protein: 6.1 g/dL (ref 6.1–8.1)

## 2020-03-21 LAB — HEMOGLOBIN A1C
Hgb A1c MFr Bld: 6.6 % of total Hgb — ABNORMAL HIGH (ref <5.7)
Mean Plasma Glucose: 143 (calc)
eAG (mmol/L): 7.9 (calc)

## 2020-03-21 LAB — CBC WITH DIFFERENTIAL/PLATELET
Absolute Monocytes: 610 cells/uL (ref 200–950)
Basophils Absolute: 73 cells/uL (ref 0–200)
Basophils Relative: 0.8 %
Eosinophils Absolute: 109 cells/uL (ref 15–500)
Eosinophils Relative: 1.2 %
HCT: 45.5 % — ABNORMAL HIGH (ref 35.0–45.0)
Hemoglobin: 14.9 g/dL (ref 11.7–15.5)
Lymphs Abs: 1620 cells/uL (ref 850–3900)
MCH: 30 pg (ref 27.0–33.0)
MCHC: 32.7 g/dL (ref 32.0–36.0)
MCV: 91.7 fL (ref 80.0–100.0)
MPV: 9.5 fL (ref 7.5–12.5)
Monocytes Relative: 6.7 %
Neutro Abs: 6689 cells/uL (ref 1500–7800)
Neutrophils Relative %: 73.5 %
Platelets: 333 10*3/uL (ref 140–400)
RBC: 4.96 10*6/uL (ref 3.80–5.10)
RDW: 13.3 % (ref 11.0–15.0)
Total Lymphocyte: 17.8 %
WBC: 9.1 10*3/uL (ref 3.8–10.8)

## 2020-03-21 LAB — LIPID PANEL (REFL)
Cholesterol: 137 mg/dL (ref <200)
HDL: 42 mg/dL — ABNORMAL LOW (ref >=50)
LDL Cholesterol (Calc): 67 mg/dL (calc)
Non-HDL Cholesterol (Calc): 95 mg/dL (calc) (ref <130)
Total CHOL/HDL Ratio: 3.3 (calc) (ref <5.0)
Triglycerides: 211 mg/dL — ABNORMAL HIGH (ref <150)

## 2020-03-21 MED ORDER — ALPHA-LIPOIC ACID 600 MG PO CAPS: 600.0000 mg | ORAL_CAPSULE | Freq: Every day | ORAL | 3 refills | Status: DC

## 2020-03-21 MED ORDER — ALPHA-LIPOIC ACID 600 MG PO CAPS: 600.0000 mg | ORAL_CAPSULE | Freq: Every day | ORAL | 11 refills | Status: DC

## 2020-03-24 ENCOUNTER — Encounter: Payer: Self-pay | Admitting: Family Medicine

## 2020-03-28 ENCOUNTER — Encounter: Payer: Self-pay | Admitting: Family Medicine

## 2020-04-18 ENCOUNTER — Ambulatory Visit: Payer: Medicare PPO

## 2020-04-18 ENCOUNTER — Ambulatory Visit
Admission: RE | Admit: 2020-04-18 | Discharge: 2020-04-18 | Disposition: A | Discharge status: Home / Self Care | Payer: Medicare PPO | Source: Ambulatory Visit | Attending: Family Medicine | Admitting: Family Medicine

## 2020-04-18 ENCOUNTER — Other Ambulatory Visit: Payer: Self-pay

## 2020-04-18 DIAGNOSIS — Z1231 Encounter for screening mammogram for malignant neoplasm of breast: Secondary | ICD-10-CM | POA: Diagnosis not present

## 2020-04-18 DIAGNOSIS — Z1239 Encounter for other screening for malignant neoplasm of breast: Secondary | ICD-10-CM

## 2020-06-06 LAB — HM DIABETES EYE EXAM

## 2020-06-23 ENCOUNTER — Ambulatory Visit (HOSPITAL_COMMUNITY)
Admission: EM | Admit: 2020-06-23 | Discharge: 2020-06-23 | Disposition: A | Discharge status: Home / Self Care | Payer: Medicare PPO | Attending: Emergency Medicine | Admitting: Emergency Medicine

## 2020-06-23 ENCOUNTER — Other Ambulatory Visit: Payer: Self-pay

## 2020-06-23 ENCOUNTER — Ambulatory Visit (INDEPENDENT_AMBULATORY_CARE_PROVIDER_SITE_OTHER): Payer: Medicare PPO

## 2020-06-23 ENCOUNTER — Encounter (HOSPITAL_COMMUNITY): Payer: Self-pay

## 2020-06-23 DIAGNOSIS — W19XXXA Unspecified fall, initial encounter: Secondary | ICD-10-CM

## 2020-06-23 DIAGNOSIS — S6991XA Unspecified injury of right wrist, hand and finger(s), initial encounter: Secondary | ICD-10-CM | POA: Diagnosis not present

## 2020-06-23 NOTE — Discharge Instructions (Addendum)
400 mg ibuprofen combined with 1000 mg of Tylenol 3 to 4 times a day as needed for pain, continue ice.  Do not use the diclofenac gel if taking the ibuprofen.  May buddy tape fingers together.

## 2020-06-23 NOTE — ED Triage Notes (Signed)
Pt reports she tripped on a threshold at the store yesterday and fell forward, bracing with bilateral hands. Pt states her nose was scraped, eyeglasses fell off, bilateral abrasions to knees. Right 4th finger pain.  Right 4th finger with significant edema to middle joint, erythema, ecchymoses. Able to make a fist and extend fingers.  Denies head trauma/injury, LOC, hand pain.  Pt removed rings PTA.

## 2020-06-23 NOTE — ED Provider Notes (Signed)
HPI  SUBJECTIVE:  Samantha Snyder is a right-handed 74 y.o. female who presents with swelling, bruising, pain in the right ring finger after having a trip and fall yesterday over a curb.  She denies hitting her head, loss of consciousness, nausea, headache.  She denies limitation of motion of the finger.  She states her hand, wrist, forearm are without injury.  She reports pain described as pressure, and pain at the DIP.  States that she was able to get her rings off before the swelling became severe.  She has tried ice, ibuprofen 400 mg every 4 hours, diclofenac cream.  She states the ice and ibuprofen help.  No aggravating factors.  She states that she is on topical diclofenac daily for arthritis in her hands.  She has a history of osteopenia, hypertension, smoking, hypercholesterolemia, diabetes with peripheral neuropathy.  No chronic kidney disease.  TMA:UQJFHL, [name], MD  Past Medical History:  Diagnosis Date  . Allergy    minor hay fever  . Arthritis    hands x 2, knee   . Cataract    small  . Chicken pox 02/24/2014   Has had shingles vaccine  . Chronic diarrhea    For one year-needed prolonged course of antibiotics  . Diabetes mellitus without complication ([place])   . Diverticulosis 02/24/2014  . Emphysema of lung ([place])    mild per pt. per x ray   . History of UTI 2014   per pt noted after taking Bactrim due to tooth infection  . Hyperlipidemia   . Neuromuscular disorder (HCC)    neuropathy both feet   . Osteopenia   . 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 Vaginal prolapse    Status post surgery. Improved urinary incontinence    Past Surgical History:  Procedure Laterality Date  . ABDOMINAL HYSTERECTOMY  09-07-2013  . ABDOMINAL SACROCOLPOPEXY  09-07-2013  . BLADDER SURGERY  09/07/2013   vaginal prolapse  . COLONOSCOPY  11-06-2005   sig tics, no polyps-[facility]   . CYSTOSCOPY  09-07-2013  . ENTEROCELE REPAIR  09-07-2013  . INDUCED ABORTION   1985   Therapeutic  . LAPAROSCOPIC BILATERAL SALPINGO OOPHERECTOMY  09-07-2013  . POLYPECTOMY    . PUBOVAGINAL SLING  09-07-2013  . TONSILLECTOMY Bilateral 1955  . TUBAL LIGATION      Family History  Problem Relation Age of Onset  . Cancer Father        Lung but was a smoker  . Stroke Father        80 massive lead to death  . Diabetes Father   . Bladder Cancer Father   . Lung cancer Father   . Osteoporosis Mother   . Anuerysm Mother   . Diabetes Maternal Grandmother   . Colon cancer Neg Hx   . Colon polyps Neg Hx   . Esophageal cancer Neg Hx   . Rectal cancer Neg Hx   . Stomach cancer Neg Hx   . Breast cancer Neg Hx     Social History   Tobacco Use  . Smoking status: Current Every Day Smoker    Packs/day: 1.00    Years: 53.00    Pack years: 53.00    Types: Cigarettes  . Smokeless tobacco: Never Used  . Tobacco comment: Counseled that the single most powerful action she could take to decrease her risk of lung cancer is to quit smoking  Substance Use Topics  . Alcohol use:  No    Alcohol/week: 0.0 standard drinks  . Drug use: No    No current facility-administered medications for this encounter.  Current Outpatient Medications:  .  Alpha-Lipoic Acid 600 MG CAPS, Take 1 capsule (600 mg total) by mouth daily., Disp: 90 capsule, Rfl: 3 .  aspirin 81 MG tablet, Take 81 mg by mouth daily., Disp: , Rfl:  .  atorvastatin (LIPITOR) 20 MG tablet, Take 1 tablet (20 mg total) by mouth daily., Disp: 90 tablet, Rfl: 3 .  fexofenadine (ALLEGRA) 180 MG tablet, Take 180 mg by mouth daily., Disp: , Rfl:  .  lisinopril (ZESTRIL) 5 MG tablet, Take 1 tablet (5 mg total) by mouth daily., Disp: 90 tablet, Rfl: 3 .  metFORMIN (GLUCOPHAGE) 1000 MG tablet, TAKE 1 TABLET BY MOUTH TWO  TIMES DAILY WITH A MEAL, Disp: 180 tablet, Rfl: 3 .  Accu-Chek FastClix Lancets MISC, USE TO CHECK BLOOD SUGARS  3-4 TIMES DAILY., Disp: 408 each, Rfl: 3 .  ACCU-CHEK SMARTVIEW test strip, USE TO TEST BLOOD  SUGARS  3-4 TIMES DAILY, Disp: 400 strip, Rfl: 3 .  Blood Glucose Monitoring Suppl (ACCU-CHEK NANO SMARTVIEW) w/Device KIT, Use to check blood sugars 3-4 times daily, Disp: 1 kit, Rfl: 0 .  Calcium Carbonate-Vit D-Min (CALCIUM 1200 PO), Take 2 tablets by mouth., Disp: , Rfl:  .  Cholecalciferol (VITAMIN D3) 2000 UNITS TABS, Take 1 tablet by mouth., Disp: , Rfl:  .  ibuprofen (ADVIL,MOTRIN) 400 MG tablet, Take 400 mg by mouth at bedtime as needed. Reported on 09/04/2015, Disp: , Rfl:  .  Magnesium 500 MG CAPS, Take 1 capsule by mouth., Disp: , Rfl:  .  Multiple Vitamins-Minerals (MULTIVITAMIN PO), Take 1 tablet by mouth., Disp: , Rfl:  .  Omega-3 Fatty Acids (FISH OIL PO), Take 2 tablets by mouth 2 (two) times daily. 1,000 mg, Disp: , Rfl:  .  OVER THE COUNTER MEDICATION, Herbal tea 8 ounces daily, Disp: , Rfl:  .  Probiotic Product (PROBIOTIC DAILY PO), Take 1 tablet by mouth., Disp: , Rfl:  .  Wheat Dextrin (BENEFIBER PO), Take 1 tablet by mouth., Disp: , Rfl:   No Known Allergies   ROS  As noted in HPI.   Physical Exam  BP 127/81 (BP Location: Left Arm)   Pulse 79   Temp 99.1 F (37.3 C) (Oral)   Resp 19   LMP  (LMP Unknown)   SpO2 94%   Constitutional: Well developed, well nourished, no acute distress Eyes:  EOMI, conjunctiva normal bilaterally HENT: Normocephalic, atraumatic,mucus membranes moist Respiratory: Normal inspiratory effort Cardiovascular: Normal rate GI: nondistended skin: No rash, skin intact Musculoskeletal: Erythema, extensive soft tissue swelling fourth ring finger.  Positive mild tenderness and bruising at the DIP.  No tenderness over the proximal, middle, distal phalanx.  No tenderness over the PIP.  FDS/FDP intact.  Sensation light touch interpreter intact.  Cap refill less than 2 seconds.  DIP, PIP stable on varus/valgus stress.  Rest of the hand nontender, without injury.  Wrist nontender, has full range of motion, no injury. Neurologic: Alert & oriented x  3, no focal neuro deficits Psychiatric: Speech and behavior appropriate   ED Course   Medications - No data to display  Orders Placed This Encounter  Procedures  . DG Finger Ring Right    Standing Status:   Standing    Number of Occurrences:   1    Order Specific Question:   Reason for Exam (SYMPTOM  OR DIAGNOSIS  REQUIRED)    Answer:   fall, edema, ecchymoses   No results found for this or any previous visit (from the past 24 hour(s)). No results found.  CLINICAL DATA:  [place] and injured right ring finger.  EXAM: RIGHT RING FINGER 2+V  COMPARISON:  Radiographs 07/17/2018  FINDINGS: Stable advanced degenerative changes at the distal interphalangeal joint.  No acute fractures identified.  IMPRESSION: No acute bony findings.   Electronically Signed   By: Marijo Sanes M.D.   On: 06/23/2020 08:43CLINICAL DATA:  [place] and injured right ring finger.   ED Clinical Impression  1. Jammed interphalangeal joint of finger of right hand, initial encounter      ED Assessment/Plan   Reviewed imaging independently.  Extensive soft tissue swelling.  No fracture.  See radiology report for full details.  Finger is not broken.  She seems to have jammed it.  She states she has plenty of ibuprofen at home.  Advised 400 mg ibuprofen combined with 1000 mg of Tylenol 3-4 times a day as needed for pain, continue ice.  May buddy tape fingers together.  Follow-up with PMD as needed  Discussed imaging, MDM, treatment plan, and plan for follow-up with patient. . patient agrees with plan.   No orders of the defined types were placed in this encounter.   *This clinic note was created using Dragon dictation software. Therefore, there may be occasional mistakes despite careful proofreading.   ?    Melynda Ripple, MD 06/25/20 [phone]

## 2020-06-29 ENCOUNTER — Other Ambulatory Visit: Payer: Self-pay | Admitting: Family Medicine

## 2020-07-05 ENCOUNTER — Other Ambulatory Visit: Payer: Self-pay | Admitting: Family Medicine

## 2020-07-05 DIAGNOSIS — M8588 Other specified disorders of bone density and structure, other site: Secondary | ICD-10-CM

## 2020-07-07 ENCOUNTER — Other Ambulatory Visit: Payer: Medicare PPO

## 2020-08-17 ENCOUNTER — Encounter: Payer: Self-pay | Admitting: Family Medicine

## 2020-08-30 ENCOUNTER — Encounter: Payer: Self-pay | Admitting: Family Medicine

## 2020-08-30 ENCOUNTER — Telehealth: Payer: Self-pay

## 2020-08-30 MED ORDER — ACCU-CHEK FASTCLIX LANCETS MISC: 3 refills | Status: DC

## 2020-08-30 MED ORDER — ACCU-CHEK NANO SMARTVIEW W/DEVICE KIT: PACK | 3 refills | Status: DC

## 2020-08-30 MED ORDER — ACCU-CHEK SMARTVIEW VI STRP: ORAL_STRIP | 3 refills | Status: DC

## 2020-08-30 NOTE — Telephone Encounter (Signed)
Patient called in and stated she wanted [employer] Blood Glucose Meter and test strips, Lancets and the Alcohol pads. That she doesn't want the other one that was ordered.

## 2020-08-30 NOTE — Telephone Encounter (Signed)
MEDICATION: glucose meter, diabetic supplies  PHARMACY: [facility]  Comments: Pt is running out of supplies. Pt states she reached out to Dr. Yong Channel on Feb 29th.   **Let patient know to contact pharmacy at the end of the day to make sure medication is ready. **  ** Please notify patient to allow 48-72 hours to process**  **Encourage patient to contact the pharmacy for refills or they can request refills through [facility]**

## 2020-08-30 NOTE — Telephone Encounter (Signed)
Rx sent 

## 2020-09-01 ENCOUNTER — Other Ambulatory Visit: Payer: Self-pay

## 2020-09-01 NOTE — Telephone Encounter (Signed)
Patient called regarding medication.

## 2020-09-03 ENCOUNTER — Other Ambulatory Visit: Payer: Self-pay

## 2020-09-04 NOTE — Telephone Encounter (Signed)
Pt is requesting a call in regards to messages below

## 2020-09-04 NOTE — Telephone Encounter (Signed)
Patient is wanting a call from [facility] as soon as possible to help straighten out a prescription.

## 2020-09-18 NOTE — Progress Notes (Signed)
Phone [phone] In person visit   Subjective:   Samantha Snyder is a 75 y.o. year old very pleasant female patient who presents for/with See problem oriented charting Chief Complaint  Patient presents with  . Hypertension  . Hyperlipidemia  . Diabetes   This visit occurred during the SARS-CoV-2 public health emergency.  Safety protocols were in place, including screening questions prior to the visit, additional usage of staff PPE, and extensive cleaning of exam room while observing appropriate contact time as indicated for disinfecting solutions.   Past Medical History-  Patient Active Problem List   Diagnosis Date Noted  . DM (diabetes mellitus) type II controlled, neurological manifestation ([place]) 02/24/2014    Priority: High  . Tobacco abuse 02/24/2014    Priority: High  . Osteopenia 03/30/2014    Priority: Medium  . Hypertension associated with diabetes ([place]) 03/08/2014    Priority: Medium  . Hyperlipidemia associated with type 2 diabetes mellitus ([place]) 02/24/2014    Priority: Medium  . Aortic atherosclerosis ([place]) 07/08/2017    Priority: Low  . Diabetic peripheral neuropathy ([place]) 10/27/2014    Priority: Low  . Seasonal allergies 02/24/2014    Priority: Low    Medications- reviewed and updated Current Outpatient Medications  Medication Sig Dispense Refill  . Alpha-Lipoic Acid 600 MG CAPS Take 1 capsule (600 mg total) by mouth daily. 90 capsule 3  . aspirin 81 MG tablet Take 81 mg by mouth daily.    [name] atorvastatin (LIPITOR) 20 MG tablet TAKE 1 TABLET EVERY DAY 90 tablet 3  . Calcium Carbonate-Vit D-Min (CALCIUM 1200 PO) Take 2 tablets by mouth.    . Cholecalciferol (VITAMIN D3) 2000 UNITS TABS Take 1 tablet by mouth.    . fexofenadine (ALLEGRA) 180 MG tablet Take 180 mg by mouth daily.    [name] ibuprofen (ADVIL,MOTRIN) 400 MG tablet Take 400 mg by mouth at bedtime as needed. Reported on 09/04/2015    . lisinopril (ZESTRIL) 5 MG tablet TAKE 1 TABLET EVERY DAY 90  tablet 3  . Magnesium 500 MG CAPS Take 1 capsule by mouth.    . metFORMIN (GLUCOPHAGE) 1000 MG tablet TAKE 1 TABLET TWICE DAILY WITH A MEAL 180 tablet 3  . Multiple Vitamins-Minerals (MULTIVITAMIN PO) Take 1 tablet by mouth.    . Omega-3 Fatty Acids (FISH OIL PO) Take 2 tablets by mouth 2 (two) times daily. 1,000 mg    . OVER THE COUNTER MEDICATION Herbal tea 8 ounces daily    . Probiotic Product (PROBIOTIC DAILY PO) Take 1 tablet by mouth.    . Wheat Dextrin (BENEFIBER PO) Take 1 tablet by mouth.     No current facility-administered medications for this visit.     Objective:  BP 118/82   Pulse 79   Temp (!) 97.2 F (36.2 C) (Temporal)   Ht 5\' 9"  (1.753 m)   Wt 195 lb 3.2 oz (88.5 kg)   LMP  (LMP Unknown)   SpO2 95%   BMI 28.83 kg/m  Gen: NAD, resting comfortably CV: RRR no murmurs rubs or gallops Lungs: CTAB no crackles, wheeze, rhonchi Abdomen: soft/nontender/nondistended/normal bowel sounds.   Ext: no edema Skin: warm, dry   Diabetic Foot Exam - Simple   Simple Foot Form Diabetic Foot exam was performed with the following findings: Yes 09/19/2020  8:13 AM  Visual Inspection See comments: Yes Sensation Testing See comments: Yes Pulse Check Posterior Tibialis and Dorsalis pulse intact bilaterally: Yes Comments Bilateral bunions. Callous at IT bilaterally on great  toe. Hammertoe bilaterally- displaced on top of great toe on left foot.  Diminished sensation to monofilament at the toes - barely perceptible- able to feel at rest of foot        Assessment and Plan   #hypertension S: medication: Lisinopril 5Mg  Home readings #s: does not check   BP Readings from Last 3 Encounters:  09/19/20 118/82  06/23/20 127/81  03/20/20 124/80   A/P: well controlled on repeat- continue current meds   #hyperlipidemia #Aortic atherosclerosis-LDL goal under 70.  Incidental finding on imaging S: Medication: Atorvastatin 20Mg , aspirin 81 mg for primary prevention Lab Results   Component Value Date   CHOL 137 03/20/2020   HDL 42 (L) 03/20/2020   LDLCALC 67 03/20/2020   LDLDIRECT 73.0 03/11/2017   TRIG 211 (H) 03/20/2020   CHOLHDL 3.3 03/20/2020    A/P: Well-controlled on last check-continue current medications. Work on lifestyle to lower triglycerides.   # Diabetes-A1c typically under 7 #Diabetic neuropathy-alpha lipoic acid generally helpful, also on voltaren gel .  Requires diabetic shoes/insoles- forms needed today  S: Medication: Metformin 1000Mg  twice daily CBGs- 128 this morning- new meter. Lowest she has seen has been before 90 before lunch. Usually 115 before dinner. Average around 120 Exercise and diet- not exercising- neuropathy- but trying to remain active 4k steps a day. Weight stable  Lab Results  Component Value Date   HGBA1C 6.6 (H) 03/20/2020   HGBA1C 6.5 (A) 09/15/2019   HGBA1C 6.8 (H) 12/29/2018  A/P: diabetes hopefully stable- update a1c. Continue current meds  For neuropathy- continue current meds. We also filled out forms for diabetic shoes/inserts- hammertoes/callous etc.   #Tobacco abuse S:1 pack/day A/P: Encourage cessation-not ready to quit.  Already enrolled in lung cancer screening program.  Discussed ability to reduce cancer risk by quitting smoking  # Low Bone density (formerly osteopenia) S: Last DEXA: 04/13/2018 worst T score -1.8 at lumbar spine  Calcium: 1200mg  (through diet ok) recommended . She is on 2400mg  Vitamin D: 1000 units a day recommended. She is on 5000 units  A/P: Patient is now due for bone density repeat-scheduled may 5th. Continue calcium vitamin D- but reduce amounts to 1200 mg total calcium and we will check vitamin D to make sure not too high   Recommended follow up: Return in about 6 months (around 03/22/2021) for physical or sooner if needed. Future Appointments  Date Time Provider [place]  10/26/2020  1:30 PM [facility] DX DEXA 1 [facility]  03/23/2021 10:15 AM [facility] HEALTH COACH  [facility]    Lab/Order associations:   ICD-10-CM   1. Hypertension associated with diabetes ([place])  E11.59 CBC with Differential/Platelet   I15.2 Comprehensive metabolic panel  2. Hyperlipidemia associated with type 2 diabetes mellitus ([place])  E11.69    E78.5   3. Controlled type 2 diabetes mellitus with diabetic polyneuropathy, without long-term current use of insulin (HCC)  E11.42 CBC with Differential/Platelet    Comprehensive metabolic panel    Hemoglobin A1c    Vitamin B12  4. Osteopenia of lumbar spine  M85.88 VITAMIN D 25 Hydroxy (Vit-D Deficiency, Fractures)  5. High risk medication use  Z79.899 VITAMIN D 25 Hydroxy (Vit-D Deficiency, Fractures)    Vitamin B12   Return precautions advised.  Garret Reddish, MD

## 2020-09-18 NOTE — Patient Instructions (Addendum)
Please stop by lab before you go If you have mychart- we will send your results within 3 business days of [place] receiving them.  If you do not have mychart- we will call you about results within 5 business days of [place] receiving them.  *please also note that you will see labs on mychart as soon as they post. I will later go in and write notes on them- will say "notes from Dr. Yong Channel"  Team please give her a calcium in diet handout- only need 1200mg  total per day  Jazz please order strips, lancets, alcohol swabs for true metrics meter  Recommended follow up: Return in about 6 months (around 03/22/2021) for physical or sooner if needed.

## 2020-09-19 ENCOUNTER — Other Ambulatory Visit: Payer: Self-pay

## 2020-09-19 ENCOUNTER — Encounter: Payer: Self-pay | Admitting: Family Medicine

## 2020-09-19 ENCOUNTER — Ambulatory Visit: Payer: Medicare PPO | Admitting: Family Medicine

## 2020-09-19 VITALS — BP 118/82 | HR 79 | Temp 97.2°F | Ht 69.0 in | Wt 195.2 lb

## 2020-09-19 DIAGNOSIS — I152 Hypertension secondary to endocrine disorders: Secondary | ICD-10-CM

## 2020-09-19 DIAGNOSIS — Z79899 Other long term (current) drug therapy: Secondary | ICD-10-CM | POA: Diagnosis not present

## 2020-09-19 DIAGNOSIS — E1142 Type 2 diabetes mellitus with diabetic polyneuropathy: Secondary | ICD-10-CM

## 2020-09-19 DIAGNOSIS — M8588 Other specified disorders of bone density and structure, other site: Secondary | ICD-10-CM

## 2020-09-19 DIAGNOSIS — E1159 Type 2 diabetes mellitus with other circulatory complications: Secondary | ICD-10-CM | POA: Diagnosis not present

## 2020-09-19 DIAGNOSIS — E1169 Type 2 diabetes mellitus with other specified complication: Secondary | ICD-10-CM

## 2020-09-19 DIAGNOSIS — E785 Hyperlipidemia, unspecified: Secondary | ICD-10-CM | POA: Diagnosis not present

## 2020-09-19 LAB — COMPREHENSIVE METABOLIC PANEL
ALT: 16 U/L (ref 0–35)
AST: 15 U/L (ref 0–37)
Albumin: 4.2 g/dL (ref 3.5–5.2)
Alkaline Phosphatase: 91 U/L (ref 39–117)
BUN: 21 mg/dL (ref 6–23)
CO2: 23 mEq/L (ref 19–32)
Calcium: 9.5 mg/dL (ref 8.4–10.5)
Chloride: 107 mEq/L (ref 96–112)
Creatinine, Ser: 0.6 mg/dL (ref 0.40–1.20)
GFR: 89.24 mL/min (ref >60.00)
Glucose, Bld: 167 mg/dL — ABNORMAL HIGH (ref 70–99)
Potassium: 4.3 mEq/L (ref 3.5–5.1)
Sodium: 142 mEq/L (ref 135–145)
Total Bilirubin: 0.5 mg/dL (ref 0.2–1.2)
Total Protein: 6.7 g/dL (ref 6.0–8.3)

## 2020-09-19 LAB — CBC WITH DIFFERENTIAL/PLATELET
Basophils Absolute: 0.1 10*3/uL (ref 0.0–0.1)
Basophils Relative: 0.7 % (ref 0.0–3.0)
Eosinophils Absolute: 0.1 10*3/uL (ref 0.0–0.7)
Eosinophils Relative: 1.2 % (ref 0.0–5.0)
HCT: 45.9 % (ref 36.0–46.0)
Hemoglobin: 15.1 g/dL — ABNORMAL HIGH (ref 12.0–15.0)
Lymphocytes Relative: 16.1 % (ref 12.0–46.0)
Lymphs Abs: 1.5 10*3/uL (ref 0.7–4.0)
MCHC: 32.9 g/dL (ref 30.0–36.0)
MCV: 90.3 fl (ref 78.0–100.0)
Monocytes Absolute: 0.6 10*3/uL (ref 0.1–1.0)
Monocytes Relative: 6.8 % (ref 3.0–12.0)
Neutro Abs: 6.8 10*3/uL (ref 1.4–7.7)
Neutrophils Relative %: 75.2 % (ref 43.0–77.0)
Platelets: 337 10*3/uL (ref 150.0–400.0)
RBC: 5.09 Mil/uL (ref 3.87–5.11)
RDW: 14.4 % (ref 11.5–15.5)
WBC: 9 10*3/uL (ref 4.0–10.5)

## 2020-09-19 LAB — VITAMIN D 25 HYDROXY (VIT D DEFICIENCY, FRACTURES): VITD: 45.17 ng/mL (ref 30.00–100.00)

## 2020-09-19 LAB — VITAMIN B12: Vitamin B-12: 618 pg/mL (ref 211–911)

## 2020-09-19 LAB — HEMOGLOBIN A1C: Hgb A1c MFr Bld: 7.2 % — ABNORMAL HIGH (ref 4.6–6.5)

## 2020-10-09 DIAGNOSIS — E1142 Type 2 diabetes mellitus with diabetic polyneuropathy: Secondary | ICD-10-CM | POA: Diagnosis not present

## 2020-10-25 IMAGING — DX DG HAND COMPLETE 3+V*R*
3 series · 3 of 3 positions shown · non-contrast
Comparison: None.

CLINICAL DATA: Worsening bilateral hand pain

EXAM:
RIGHT HAND - COMPLETE 3+ VIEW

[hand pa]
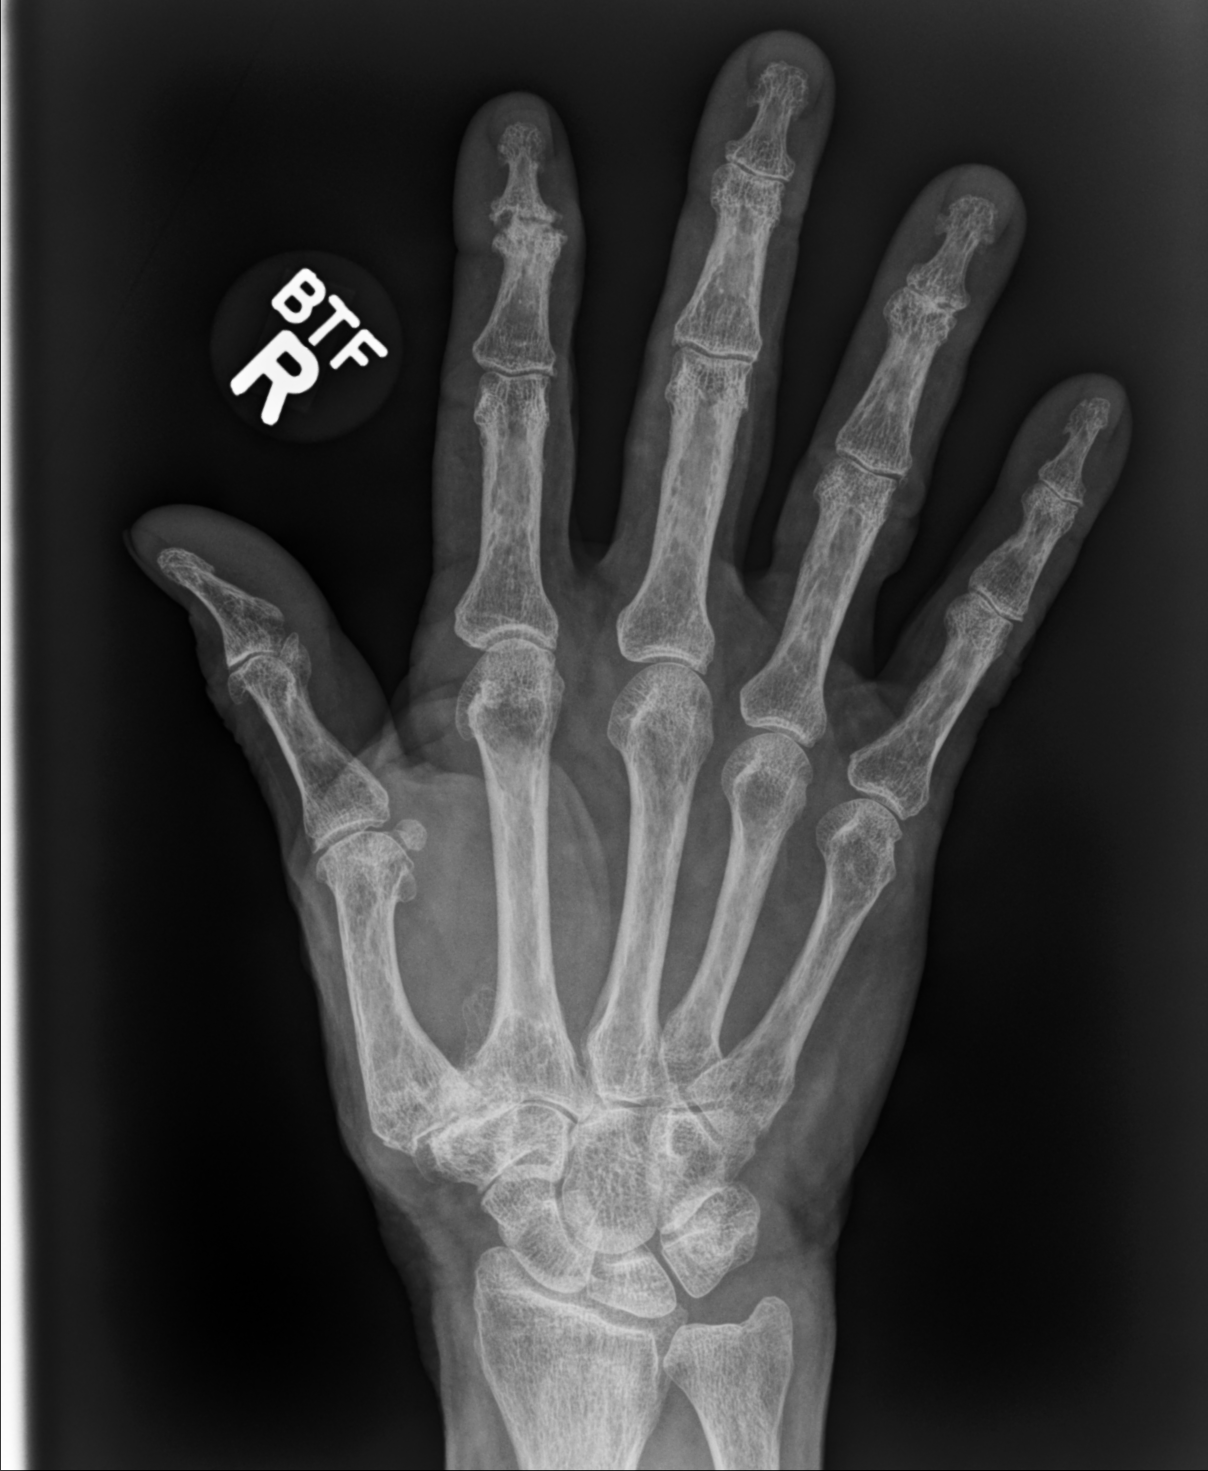

[hand oblique]
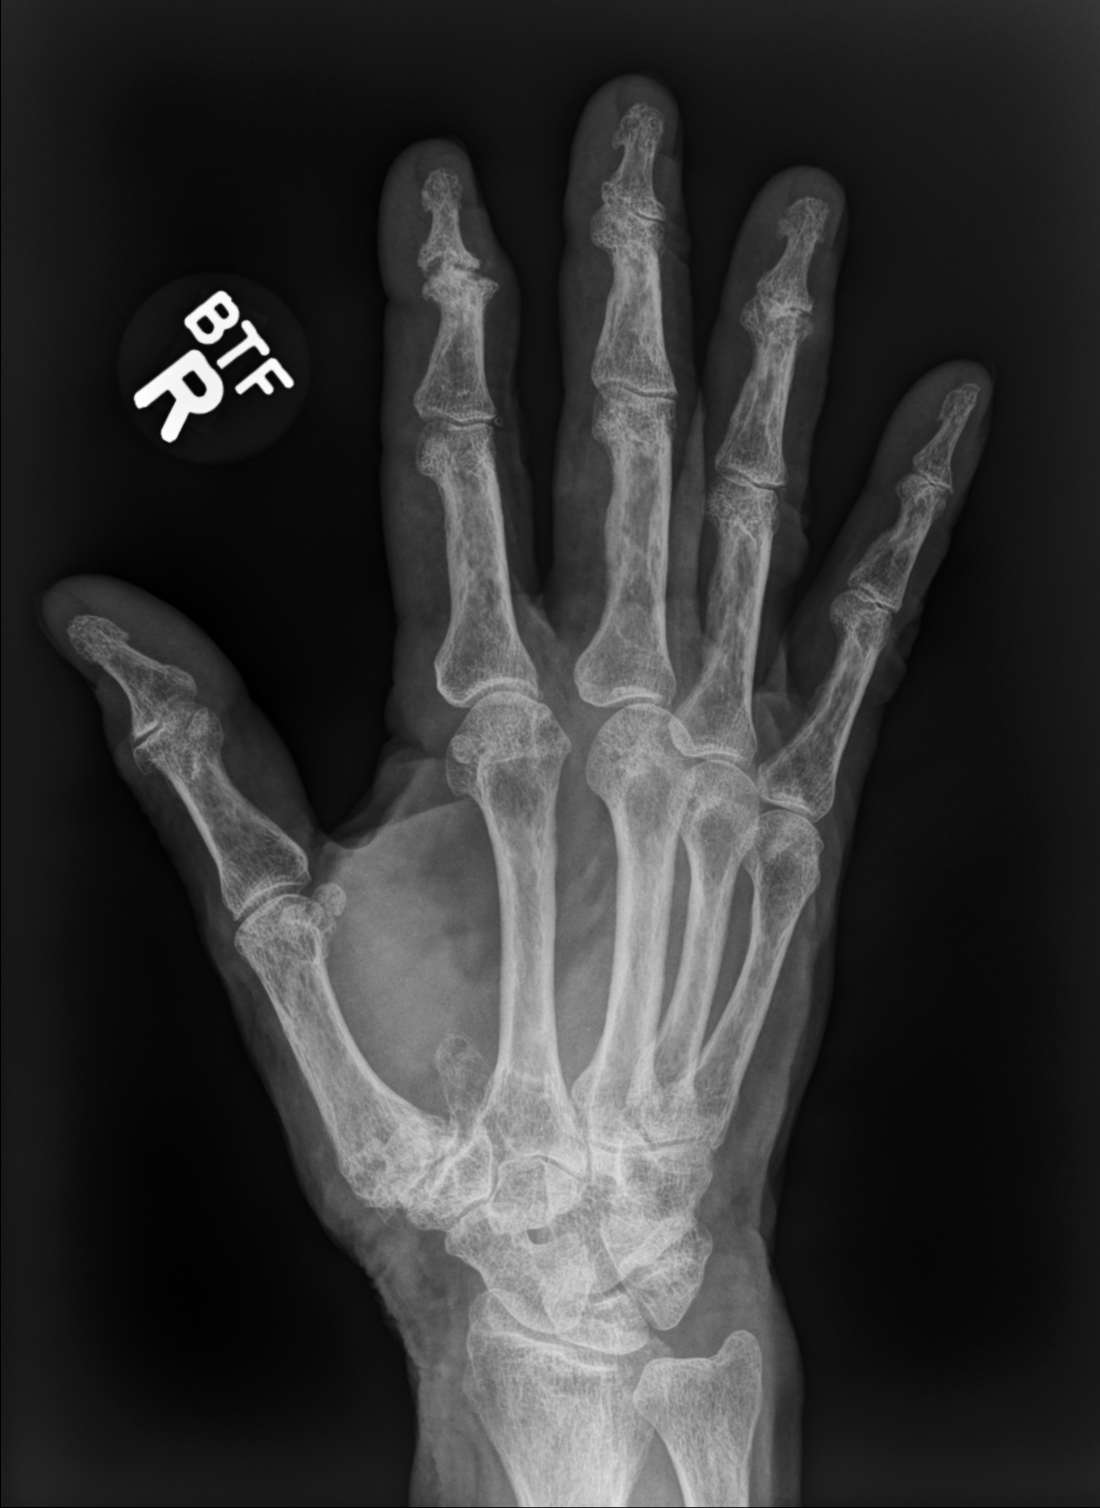

[hand lat]
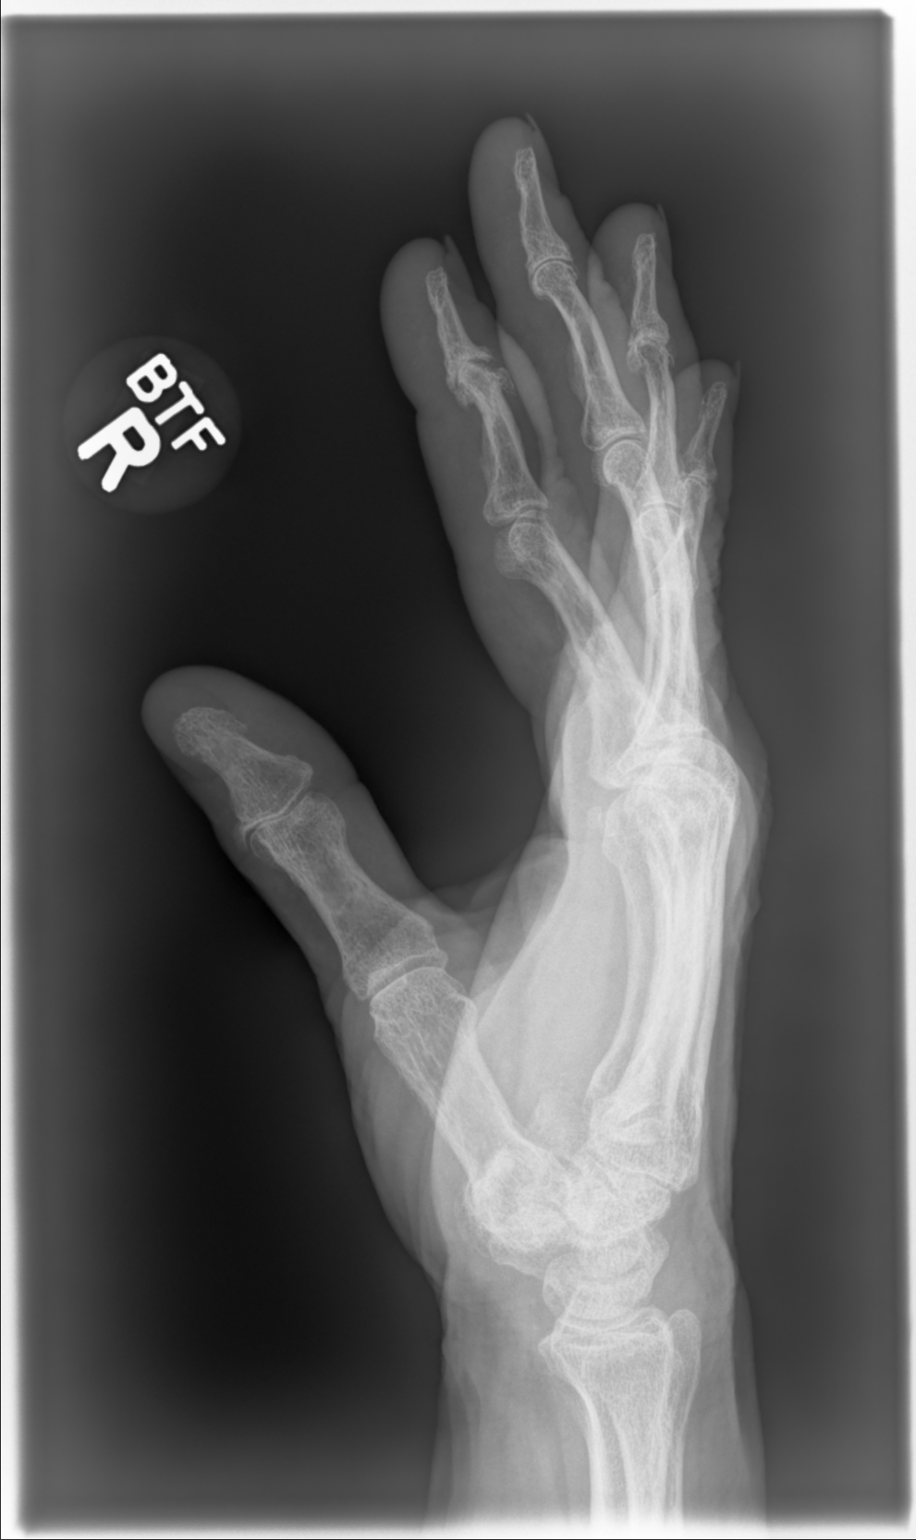

[3 of 3 positions shown; findings below may reference images not displayed]

FINDINGS: Diffuse interphalangeal osteoarthritis with some erosive changes at
the second and fourth DIP joints. Osteopenia that is generalized. No
marginal erosions. There is also advanced first CMC osteoarthritis
with prominent adjacent spur. Mild joint narrowing and spurring at
the first and second MCP joints.
IMPRESSION: 1. Interphalangeal osteoarthritis with erosive features at the
second and fourth DIP joints.
2. Notably advanced first CMC osteoarthritis with prominent adjacent
spur.
3. Osteopenia.

## 2020-10-26 ENCOUNTER — Other Ambulatory Visit: Payer: Self-pay

## 2020-10-26 ENCOUNTER — Ambulatory Visit
Admission: RE | Admit: 2020-10-26 | Discharge: 2020-10-26 | Disposition: A | Discharge status: Home / Self Care | Payer: Medicare PPO | Source: Ambulatory Visit | Attending: Family Medicine | Admitting: Family Medicine

## 2020-10-26 DIAGNOSIS — Z780 Asymptomatic menopausal state: Secondary | ICD-10-CM | POA: Diagnosis not present

## 2020-10-26 DIAGNOSIS — M8588 Other specified disorders of bone density and structure, other site: Secondary | ICD-10-CM | POA: Diagnosis not present

## 2020-10-27 ENCOUNTER — Encounter: Payer: Self-pay | Admitting: Family Medicine

## 2020-10-27 NOTE — Telephone Encounter (Signed)
FYI

## 2020-10-30 ENCOUNTER — Telehealth: Payer: Self-pay

## 2020-10-30 NOTE — Telephone Encounter (Signed)
See below

## 2020-10-30 NOTE — Telephone Encounter (Signed)
Paperwork has been faxed again.

## 2020-10-30 NOTE — Telephone Encounter (Signed)
Verdis Frederickson is calling in checking on the status of a detailed prescription they faxed over on Friday for Samantha Snyder's orthotics. Requesting a call back if we have received it.

## 2020-11-06 ENCOUNTER — Encounter: Payer: Self-pay | Admitting: Family Medicine

## 2020-11-06 ENCOUNTER — Other Ambulatory Visit: Payer: Self-pay

## 2020-11-06 ENCOUNTER — Other Ambulatory Visit: Payer: Self-pay | Admitting: Family Medicine

## 2020-11-06 ENCOUNTER — Ambulatory Visit: Payer: Medicare PPO | Admitting: Family Medicine

## 2020-11-06 VITALS — BP 112/74 | HR 80 | Temp 97.3°F | Ht 69.0 in | Wt 197.0 lb

## 2020-11-06 DIAGNOSIS — I700 Atherosclerosis of aorta: Secondary | ICD-10-CM | POA: Diagnosis not present

## 2020-11-06 DIAGNOSIS — E1142 Type 2 diabetes mellitus with diabetic polyneuropathy: Secondary | ICD-10-CM | POA: Diagnosis not present

## 2020-11-06 DIAGNOSIS — M810 Age-related osteoporosis without current pathological fracture: Secondary | ICD-10-CM | POA: Diagnosis not present

## 2020-11-06 MED ORDER — ALENDRONATE SODIUM 70 MG PO TABS
70.0000 mg | ORAL_TABLET | ORAL | 11 refills | Status: DC
Start: 2020-11-06 — End: 2021-09-24

## 2020-11-06 NOTE — Assessment & Plan Note (Signed)
#   Low Bone density (formerly osteopenia)-->osteoporosis S: Last DEXA: 10/26/2020 worst T score -2 point slightly worse from -1.8 in 2019 at lumbar spine.  Hip fracture risk at 4.9% low placing her in range to consider bisphosphonate with risk over 3%.  Total fracture risk still only around 13% below threshold for treatment.  Calcium: 1200mg  (through diet ok) recommended -she is on 1500 mg supplement-takes at night Vitamin D: 1000 units a day recommended-she is taking 4000 mg  - quitting smoking would help  A/P: We discussed elevated hip fracture risk.  As a result we will start Fosamax 70 mg weekly.  See after visit summary for more discussion.  She is already on calcium and vitamin D-actually could probably cut back on these-consider checking vitamin D with next labs -Since we are starting Fosamax will change diagnosis from osteopenia to osteoporosis

## 2020-11-06 NOTE — Patient Instructions (Addendum)
At a minimum, recommend 800 IU of vitamin D and 1200mg  of Calcium per day. You can get this with a calcium-vitamin D supplement.  Once you have the above in place, I would start taking fosamax 70mg  once a week.  Administer first thing in the morning and >30 minutes before the first food, beverage (except plain water), or other medication of the day. Do not take with mineral water or with other beverages. Stay upright (not to lie down) for at least 30 minutes after taking medicine and until after first food of the day (to reduce irritation). Must be taken with 6 to 8 oz of plain water. The tablet should be swallowed whole; do not chew or suck.  Quitting smoking is one of the best things you can do for osteoporosis  FOSAMAX Adverse Reactions The following adverse drug reactions and incidences are derived from product labeling unless otherwise specified. Incidences of adverse reactions (mostly GI) increase significantly in patients treated for Paget disease at 40 mg/day. >10%: Endocrine & metabolic: Decreased serum calcium (18%; transient, mild) (table 1) 1% to 10%:  Endocrine & metabolic: Decreased serum phosphate (10%; transient, mild)  Gastrointestinal: Abdominal distension (?1%), abdominal pain (2% to 7%), acid regurgitation (1% to 5%), constipation (?3%), diarrhea (?3%), dyspepsia (1% to 3%), dysphagia (?1%) (table 2), esophageal ulcer (?2%) (table 3), flatulence (?4%), gastric ulcer (?1%; may be severe with complications), gastritis (?1%), gastroesophageal reflux disease (3%), melena (1%), nausea (1% to 3%)  Nervous system: Headache (3%)  Neuromuscular & skeletal: Muscle cramps (?1%), musculoskeletal pain (?6%; includes bone pain, joint pain, and muscle pain)  Recommended follow up: keep October visit or sooner if needed

## 2020-11-06 NOTE — Progress Notes (Signed)
Phone [phone] In person visit   Subjective:   Samantha Snyder is a 75 y.o. year old very pleasant female patient who presents for/with See problem oriented charting Chief Complaint  Patient presents with  . Bone Density     Wants to discuss her Bone density.    This visit occurred during the SARS-CoV-2 public health emergency.  Safety protocols were in place, including screening questions prior to the visit, additional usage of staff PPE, and extensive cleaning of exam room while observing appropriate contact time as indicated for disinfecting solutions.   Past Medical History-  Patient Active Problem List   Diagnosis Date Noted  . DM (diabetes mellitus) type II controlled, neurological manifestation ([place]) 02/24/2014    Priority: High  . Tobacco abuse 02/24/2014    Priority: High  . Osteoporosis 03/30/2014    Priority: Medium  . Hypertension associated with diabetes ([place]) 03/08/2014    Priority: Medium  . Hyperlipidemia associated with type 2 diabetes mellitus ([place]) 02/24/2014    Priority: Medium  . Aortic atherosclerosis ([place]) 07/08/2017    Priority: Low  . Diabetic peripheral neuropathy ([place]) 10/27/2014    Priority: Low  . Seasonal allergies 02/24/2014    Priority: Low    Medications- reviewed and updated Current Outpatient Medications  Medication Sig Dispense Refill  . alendronate (FOSAMAX) 70 MG tablet Take 1 tablet (70 mg total) by mouth every 7 (seven) days. Take with a full glass of water on an empty stomach. 5 tablet 11  . Alpha-Lipoic Acid 600 MG CAPS Take 1 capsule (600 mg total) by mouth daily. 90 capsule 3  . aspirin 81 MG tablet Take 81 mg by mouth daily.    [name] atorvastatin (LIPITOR) 20 MG tablet TAKE 1 TABLET EVERY DAY 90 tablet 3  . Calcium Carbonate-Vit D-Min (CALCIUM 1200 PO) Take 1 tablet by mouth.    . Cholecalciferol (VITAMIN D3) 2000 UNITS TABS Take 1 tablet by mouth.    . fexofenadine (ALLEGRA) 180 MG tablet Take 180 mg by mouth daily.     [name] ibuprofen (ADVIL,MOTRIN) 400 MG tablet Take 400 mg by mouth at bedtime as needed. Reported on 09/04/2015    . lisinopril (ZESTRIL) 5 MG tablet TAKE 1 TABLET EVERY DAY 90 tablet 3  . Magnesium 500 MG CAPS Take 1 capsule by mouth.    . metFORMIN (GLUCOPHAGE) 1000 MG tablet TAKE 1 TABLET TWICE DAILY WITH A MEAL 180 tablet 3  . Multiple Vitamins-Minerals (HAIR SKIN AND NAILS FORMULA PO) Take by mouth. With Biotin. 2 tablets daily.    . Multiple Vitamins-Minerals (MULTIVITAMIN PO) Take 1 tablet by mouth.    . Omega-3 Fatty Acids (FISH OIL PO) Take 2 tablets by mouth 2 (two) times daily. 1,000 mg    . OVER THE COUNTER MEDICATION Herbal tea 8 ounces daily    . Probiotic Product (PROBIOTIC DAILY PO) Take 1 tablet by mouth.    . Wheat Dextrin (BENEFIBER PO) Take 1 tablet by mouth.     No current facility-administered medications for this visit.     Objective:  BP 112/74   Pulse 80   Temp (!) 97.3 F (36.3 C) (Temporal)   Ht 5\' 9"  (1.753 m)   Wt 197 lb (89.4 kg)   LMP  (LMP Unknown)   SpO2 91%   BMI 29.09 kg/m  Gen: NAD, resting comfortably     Assessment and Plan  #Aortic atherosclerosis-LDL goal under 70.  Incidental finding on imaging S: Medication: Atorvastatin 20Mg   Lab Results  Component Value Date   CHOL 137 03/20/2020   HDL 42 (L) 03/20/2020   LDLCALC 67 03/20/2020   LDLDIRECT 73.0 03/11/2017   TRIG 211 (H) 03/20/2020   CHOLHDL 3.3 03/20/2020    A/P: LDL at goal on last check-continue current medications for risk factor modification including for diabetes and hypertension as well  # Diabetes-A1c typically under 7 S: Medication: Metformin 1000Mg  twice daily CBGs- None taken  Exercise and diet- Patient walks approximately 4000 steps daily, but plans to complete more weight bearing exercise-her bilateral lower extremity neuropathy is impeding her exercising goals. Reasonably healthy diet(growing her own collards)-she has been eating more sugars but she plans to cut down Lab  Results  Component Value Date   HGBA1C 7.2 (H) 09/19/2020  A/P: Mild poor control last check- patient is cutting down on her sugar intake and she believes this will help at follow-up- scheduled for October   # Low Bone density (formerly osteopenia)-->osteoporosis S: Last DEXA: 10/26/2020 worst T score -2 point slightly worse from -1.8 in 2019 at lumbar spine.  Hip fracture risk at 4.9% low placing her in range to consider bisphosphonate with risk over 3%.  Total fracture risk still only around 13% below threshold for treatment.  Calcium: 1200mg  (through diet ok) recommended -she is on 1500 mg supplement-takes at night Vitamin D: 1000 units a day recommended-she is taking 4000 mg  - quitting smoking would help  A/P: We discussed elevated hip fracture risk.  As a result we will start Fosamax 70 mg weekly.  See after visit summary for more discussion.  She is already on calcium and vitamin D-actually could probably cut back on these-consider checking vitamin D with next labs -Since we are starting Fosamax will change diagnosis from osteopenia to osteoporosis  #Immunization-she received her 2nd COVID booster since her last visit- team will add  Recommended follow up: keep October visit Future Appointments  Date Time Provider [place]  03/23/2021 10:15 AM [facility] HEALTH COACH [facility]  03/30/2021  8:20 AM Marin Olp, MD [facility]    Lab/Order associations:   ICD-10-CM   1. Age-related osteoporosis without current pathological fracture  M81.0   2. Aortic atherosclerosis (HCC) Chronic I70.0   3. Controlled type 2 diabetes mellitus with diabetic polyneuropathy, without long-term current use of insulin (HCC)  E11.42     Meds ordered this encounter  Medications  . alendronate (FOSAMAX) 70 MG tablet    Sig: Take 1 tablet (70 mg total) by mouth every 7 (seven) days. Take with a full glass of water on an empty stomach.    Dispense:  5 tablet    Refill:  11     I,Alexis  Bryant,acting as a scribe for Garret Reddish, MD.,have documented all relevant documentation on the behalf of Garret Reddish, MD,as directed by  Garret Reddish, MD while in the presence of Garret Reddish, MD.  I, Garret Reddish, MD, have reviewed all documentation for this visit. The documentation on 11/06/20 for the exam, diagnosis, procedures, and orders are all accurate and complete.   Return precautions advised.  Garret Reddish, MD

## 2020-11-07 NOTE — Telephone Encounter (Signed)
Pt reported prescription processing from pharmacy with a discount card.

## 2020-11-25 ENCOUNTER — Encounter: Payer: Self-pay | Admitting: Family Medicine

## 2020-11-26 ENCOUNTER — Encounter: Payer: Self-pay | Admitting: Family Medicine

## 2020-11-27 ENCOUNTER — Other Ambulatory Visit: Payer: Self-pay

## 2020-11-27 MED ORDER — ALCOHOL SWABS PADS: MEDICATED_PAD | 3 refills | Status: DC

## 2020-11-27 MED ORDER — TRUE METRIX METER W/DEVICE KIT: PACK | 3 refills | Status: DC

## 2020-11-27 MED ORDER — TRUE METRIX BLOOD GLUCOSE TEST VI STRP: ORAL_STRIP | 12 refills | Status: DC

## 2020-11-27 MED ORDER — TRUEPLUS LANCETS 33G MISC: 3 refills | Status: DC

## 2020-12-01 DIAGNOSIS — E1149 Type 2 diabetes mellitus with other diabetic neurological complication: Secondary | ICD-10-CM | POA: Diagnosis not present

## 2020-12-01 DIAGNOSIS — E1142 Type 2 diabetes mellitus with diabetic polyneuropathy: Secondary | ICD-10-CM | POA: Diagnosis not present

## 2020-12-08 ENCOUNTER — Other Ambulatory Visit: Payer: Self-pay

## 2020-12-08 ENCOUNTER — Ambulatory Visit (INDEPENDENT_AMBULATORY_CARE_PROVIDER_SITE_OTHER)
Admission: RE | Admit: 2020-12-08 | Discharge: 2020-12-08 | Disposition: A | Discharge status: Home / Self Care | Payer: Medicare PPO | Source: Ambulatory Visit | Attending: Cardiology | Admitting: Cardiology

## 2020-12-08 DIAGNOSIS — F17210 Nicotine dependence, cigarettes, uncomplicated: Secondary | ICD-10-CM

## 2020-12-08 DIAGNOSIS — Z87891 Personal history of nicotine dependence: Secondary | ICD-10-CM

## 2020-12-19 ENCOUNTER — Encounter: Payer: Self-pay | Admitting: Family Medicine

## 2020-12-19 ENCOUNTER — Encounter: Payer: Self-pay | Admitting: *Deleted

## 2020-12-19 DIAGNOSIS — F17210 Nicotine dependence, cigarettes, uncomplicated: Secondary | ICD-10-CM

## 2020-12-19 DIAGNOSIS — Z87891 Personal history of nicotine dependence: Secondary | ICD-10-CM

## 2020-12-19 DIAGNOSIS — J439 Emphysema, unspecified: Secondary | ICD-10-CM | POA: Insufficient documentation

## 2020-12-19 NOTE — Progress Notes (Signed)
Please call patient and let them  know their  low dose Ct was read as a Lung RADS 2: nodules that are benign in appearance and behavior with a very low likelihood of becoming a clinically active cancer due to size or lack of growth. Recommendation per radiology is for a repeat LDCT in 12 months. .Please let them  know we will order and schedule their  annual screening scan for 11/2021. Please let them  know there was notation of CAD on their  scan.  Please remind the patient  that this is a non-gated exam therefore degree or severity of disease  cannot be determined. Please have them  follow up with their PCP regarding potential risk factor modification, dietary therapy or pharmacologic therapy if clinically indicated. Pt.  is  currently on statin therapy. Please place order for annual  screening scan for  11/2021 and fax results to PCP. Thanks so much.  CAD, but on statin. I will send to Dr. Yong Channel so he is aware. Thanks so much  Dr. Yong Channel, You have this patient on a statin, just wanted you to be linked in regarding the scan results. Thanks so much

## 2021-01-31 DIAGNOSIS — I10 Essential (primary) hypertension: Secondary | ICD-10-CM | POA: Diagnosis not present

## 2021-01-31 DIAGNOSIS — M1990 Unspecified osteoarthritis, unspecified site: Secondary | ICD-10-CM | POA: Diagnosis not present

## 2021-01-31 DIAGNOSIS — M810 Age-related osteoporosis without current pathological fracture: Secondary | ICD-10-CM | POA: Diagnosis not present

## 2021-01-31 DIAGNOSIS — J309 Allergic rhinitis, unspecified: Secondary | ICD-10-CM | POA: Diagnosis not present

## 2021-01-31 DIAGNOSIS — G8929 Other chronic pain: Secondary | ICD-10-CM | POA: Diagnosis not present

## 2021-01-31 DIAGNOSIS — E785 Hyperlipidemia, unspecified: Secondary | ICD-10-CM | POA: Diagnosis not present

## 2021-01-31 DIAGNOSIS — E1142 Type 2 diabetes mellitus with diabetic polyneuropathy: Secondary | ICD-10-CM | POA: Diagnosis not present

## 2021-01-31 DIAGNOSIS — E669 Obesity, unspecified: Secondary | ICD-10-CM | POA: Diagnosis not present

## 2021-01-31 DIAGNOSIS — Z6830 Body mass index (BMI) 30.0-30.9, adult: Secondary | ICD-10-CM | POA: Diagnosis not present

## 2021-03-05 ENCOUNTER — Other Ambulatory Visit: Payer: Self-pay | Admitting: Family Medicine

## 2021-03-05 DIAGNOSIS — Z1231 Encounter for screening mammogram for malignant neoplasm of breast: Secondary | ICD-10-CM

## 2021-03-08 ENCOUNTER — Telehealth: Payer: Self-pay | Admitting: Family Medicine

## 2021-03-08 NOTE — Telephone Encounter (Signed)
Copied from [place] [phone]. Topic: Medicare AWV >> Mar 08, 2021 11:52 AM Harris-Coley, Hannah Beat wrote: Reason for CRM: LVM 03/08/21 appt 03/26/21 time change from 230pm to 100pm.  Please confirm with patient khc

## 2021-03-22 ENCOUNTER — Telehealth: Payer: Self-pay

## 2021-03-22 ENCOUNTER — Ambulatory Visit: Payer: Medicare PPO

## 2021-03-22 NOTE — Telephone Encounter (Signed)
error 

## 2021-03-23 ENCOUNTER — Ambulatory Visit: Payer: Medicare PPO

## 2021-03-26 ENCOUNTER — Other Ambulatory Visit: Payer: Self-pay

## 2021-03-26 ENCOUNTER — Ambulatory Visit (INDEPENDENT_AMBULATORY_CARE_PROVIDER_SITE_OTHER): Payer: Medicare PPO

## 2021-03-26 ENCOUNTER — Ambulatory Visit: Payer: Medicare PPO

## 2021-03-26 ENCOUNTER — Other Ambulatory Visit: Payer: Self-pay | Admitting: Family Medicine

## 2021-03-26 DIAGNOSIS — Z0000 Encounter for general adult medical examination without abnormal findings: Secondary | ICD-10-CM

## 2021-03-26 NOTE — Progress Notes (Signed)
Phone [phone]   Subjective:  Patient presents today for their annual physical. Chief complaint-noted.   See problem oriented charting- ROS- full  review of systems was completed and negative except for: neuropathy in feet, occasional cough- known copd  The following were reviewed and entered/updated in epic: Past Medical History:  Diagnosis Date   Allergy    minor hay fever   Arthritis    hands x 2, knee    Cataract    small   Chicken pox 02/24/2014   Has had shingles vaccine   Chronic diarrhea    For one year-needed prolonged course of antibiotics   Diabetes mellitus without complication ([place])    Diverticulosis 02/24/2014   Emphysema of lung ([place])    mild per pt. per x ray    History of UTI 2014   per pt noted after taking Bactrim due to tooth infection   Hyperlipidemia    Neuromuscular disorder ([place])    neuropathy both feet    Osteopenia    Right adrenal mass ([place])    Biopsied in 2008 and benign   Vaginal prolapse    Status post surgery. Improved urinary incontinence   Patient Active Problem List   Diagnosis Date Noted   DM (diabetes mellitus) type II controlled, neurological manifestation ([place]) 02/24/2014    Priority: 1.   Tobacco abuse 02/24/2014    Priority: 1.   Aortic atherosclerosis ([place]) 07/08/2017    Priority: 2.   Osteoporosis 03/30/2014    Priority: 2.   Hypertension associated with diabetes ([place]) 03/08/2014    Priority: 2.   Hyperlipidemia associated with type 2 diabetes mellitus ([place]) 02/24/2014    Priority: 2.   Emphysema lung ([place]) 12/19/2020    Priority: 3.   Diabetic peripheral neuropathy ([place]) 10/27/2014    Priority: 3.   Seasonal allergies 02/24/2014    Priority: 3.   Past Surgical History:  Procedure Laterality Date   ABDOMINAL HYSTERECTOMY  09-07-2013   ABDOMINAL SACROCOLPOPEXY  09-07-2013   BLADDER SURGERY  09/07/2013   vaginal prolapse   COLONOSCOPY  11-06-2005   sig tics, no polyps-[facility]    CYSTOSCOPY  09-07-2013    ENTEROCELE REPAIR  09-07-2013   INDUCED ABORTION  1985   Therapeutic   LAPAROSCOPIC BILATERAL SALPINGO OOPHERECTOMY  09-07-2013   POLYPECTOMY     PUBOVAGINAL SLING  09-07-2013   TONSILLECTOMY Bilateral 1955   TUBAL LIGATION      Family History  Problem Relation Age of Onset   Cancer Father        Lung but was a smoker   Stroke Father        90 massive lead to death   Diabetes Father    Bladder Cancer Father    Lung cancer Father    Osteoporosis Mother    Anuerysm Mother    Diabetes Maternal Grandmother    Colon cancer Neg Hx    Colon polyps Neg Hx    Esophageal cancer Neg Hx    Rectal cancer Neg Hx    Stomach cancer Neg Hx    Breast cancer Neg Hx     Medications- reviewed and updated Current Outpatient Medications  Medication Sig Dispense Refill   Alcohol Swabs PADS Use to clean area before checking blood sugars. 100 each 3   alendronate (FOSAMAX) 70 MG tablet Take 1 tablet (70 mg total) by mouth every 7 (seven) days. Take with a full glass of water on an empty stomach. 5 tablet 11  Alpha-Lipoic Acid 600 MG CAPS TAKE 1 CAPSULE (600 MG TOTAL) BY MOUTH DAILY. 60 capsule 5   aspirin 81 MG tablet Take 81 mg by mouth daily.     Blood Glucose Monitoring Suppl (TRUE METRIX METER) w/Device KIT Use to test blood sugars daily. Dx: E11.9 1 kit 3   Calcium Carbonate-Vit D-Min (CALCIUM 1200 PO) Take 1 tablet by mouth.     Cholecalciferol (VITAMIN D3) 2000 UNITS TABS Take 1 tablet by mouth.     fexofenadine (ALLEGRA) 180 MG tablet Take 180 mg by mouth daily.     glucose blood (TRUE METRIX BLOOD GLUCOSE TEST) test strip Use as instructed 100 each 12   ibuprofen (ADVIL,MOTRIN) 400 MG tablet Take 400 mg by mouth at bedtime as needed. Reported on 09/04/2015     Magnesium 500 MG CAPS Take 1 capsule by mouth.     Multiple Vitamins-Minerals (HAIR SKIN AND NAILS FORMULA PO) Take by mouth. With Biotin. 2 tablets daily.     Multiple Vitamins-Minerals (MULTIVITAMIN PO) Take 1 tablet by mouth.      Omega-3 Fatty Acids (FISH OIL PO) Take 2 tablets by mouth 2 (two) times daily. 1,000 mg     OVER THE COUNTER MEDICATION Herbal tea 8 ounces daily     Probiotic Product (PROBIOTIC DAILY PO) Take 1 tablet by mouth.     TRUEplus Lancets 33G MISC Use to test blood sugars daily. Dx: E11.9 100 each 3   Wheat Dextrin (BENEFIBER PO) Take 1 tablet by mouth.     atorvastatin (LIPITOR) 20 MG tablet Take 1 tablet (20 mg total) by mouth daily. 90 tablet 3   lisinopril (ZESTRIL) 5 MG tablet Take 1 tablet (5 mg total) by mouth daily. 90 tablet 3   metFORMIN (GLUCOPHAGE) 1000 MG tablet TAKE 1 TABLET TWICE DAILY WITH A MEAL 180 tablet 3   No current facility-administered medications for this visit.    Allergies-reviewed and updated Allergies  Allergen Reactions   Fluoride Preparations Nausea Only    Patient states it burnt her mouth and she had abdominal pain for 24-36 hours.     Social History   Social History Narrative   Married 45 years in 2015.  Moved to [place] to be near daughter who lives here. Has a son as well and 18 year old grandson. 3 granddogs. One dog at home       Retired former  Academic librarian. Has a BS in biology from [employer]. Used to be an algologist-studied algae under microscope      Hobbies-exercising with her husband      Objective  Objective:  BP 121/76   Pulse 77   Temp 97.8 F (36.6 C) (Temporal)   Wt 198 lb 3.2 oz (89.9 kg)   LMP  (LMP Unknown)   SpO2 94%   BMI 29.27 kg/m  Gen: NAD, resting comfortably HEENT: Mucous membranes are moist. Oropharynx normal Neck: no thyromegaly CV: RRR no murmurs rubs or gallops Lungs: CTAB no crackles, wheeze, rhonchi Abdomen: soft/nontender/nondistended/normal bowel sounds. No rebound or guarding.  Ext: stable 1+ edema Skin: warm, dry Neuro: grossly normal, moves all extremities, PERRLA   Assessment and Plan   75 y.o. female presenting for annual physical.  Health Maintenance counseling: 1. Anticipatory  guidance: Patient counseled regarding regular dental exams -q4 months, eye exams - yearly- scheduled,  avoiding smoking and second hand smoke- see below , limiting alcohol to 1 beverage per day - does not drink .   No illicit drugs  2. Risk factor reduction:  Advised patient of need for regular exercise and diet rich and fruits and vegetables to reduce risk of heart attack and stroke. Exercise- walks onc a week for 30 minutes, encouraged greater frequency.  Diet-weight up 1 pound from May- encouraged tweaking diet- focus more on fruits/veggies and fewer processed foods/portion size. .  Wt Readings from Last 3 Encounters:  03/30/21 198 lb 3.2 oz (89.9 kg)  11/06/20 197 lb (89.4 kg)  09/19/20 195 lb 3.2 oz (88.5 kg)  3. Immunizations/screenings/ancillary studies DISCUSSED -COVID bivalent vaccination recommended- she has already had this -Flu vaccination (last one 09/21) - high-dose flu shot today Immunization History  Administered Date(s) Administered   Fluad Quad(high Dose 65+) 03/15/2019, 03/20/2020   Hepatitis A 02/28/1948   Hepatitis B 03/28/1947   Influenza, High Dose Seasonal PF 03/17/2014, 03/07/2016, 03/11/2017, 03/12/2018   Influenza,inj,Quad PF,6+ Mos 03/02/2015   Influenza-Unspecified 03/10/2010, 02/25/2012, 03/17/2013   MMR 05/14/1990, 05/14/1992   PFIZER Comirnaty(Gray Top)Covid-19 Tri-Sucrose Vaccine 10/25/2020   PFIZER(Purple Top)SARS-COV-2 Vaccination 08/15/2019, 09/08/2019, 04/23/2020, 10/25/2020, 03/06/2021   Pfizer Covid-19 Vaccine Bivalent Booster 96yr & up 03/06/2021   Pneumococcal Conjugate-13 03/17/2013, 06/07/2013   Pneumococcal Polysaccharide-23 09/19/2005, 02/24/2014   Pneumococcal-Unspecified 09/19/2005   Td 02/28/1950, 02/29/1956, 09/19/2005, 03/07/2016   Tdap 02/29/1960   Varicella 10/19/1991   Zoster Recombinat (Shingrix) 10/28/2016, 01/30/2017   Zoster, Live 02/28/2009   4. Cervical cancer screening- past age based screening recommendations  5. Breast  cancer screening-  breast exam-  breast exam declined  and mammogram - 04/18/2020 -recommended yearly 6. Colon cancer screening -01/21/2019 with 5 year repeat due to adenoma   7. Skin cancer screening- no dermatologist. advised regular sunscreen use. Denies worrisome, changing, or new skin lesions.  8. Birth control/STD check-postmenopausal, monogomous  9. Osteoporosis screening at 89 05/05/2022see below -current smoker -strongly encourage cessation.  Enrolled in lung cancer screening program.  Get UA. -Incidental emphysema noted-asymptomatic-continue to monitor-offered albuterol to have on hand   Status of chronic or acute concerns   #Aortic atherosclerosis-LDL goal under 70.  Incidental finding on imaging #Hyperlipidemia S: Medication: Atorvastatin 20Mg daily Lab Results  Component Value Date   CHOL 137 03/20/2020   HDL 42 (L) 03/20/2020   LDLCALC 67 03/20/2020   LDLDIRECT 73.0 03/11/2017   TRIG 211 (H) 03/20/2020   CHOLHDL 3.3 03/20/2020   A/P: Goal for lipids with aortic atherosclerosis is less than 70 on LDL-was at goal last year-update full lipid panel at this time -wants tsh with labs  # Diabetes-A1c typically under 7 S: Medication: Metformin 1000Mg twice daily CBGs- home #s have gone up to 140s or 150s from 130, before lunch and supper trending up some as well. hg Lab Results  Component Value Date   HGBA1C 7.2 (H) 09/19/2020   HGBA1C 6.6 (H) 03/20/2020   HGBA1C 6.5 (A) 09/15/2019   A/P: Slight increase in A1c last visit-hopefully improved-if not we discussed ozempic as next step low dose.  Prefers over rybelsus.  - for neuropathy doing alpha lipoic acid- not sur ehow much this is helping- still has issues- will continue for now  # Osteoporosis S: Last DEXA: 10/26/2020 worst T-score -2 point slightly worse from -1.8 in 2019 at lumbar spine - Hip fracture risk at 4.9% low placing her in range to consider bisphosphonate with risk over 3%.  Total fracture risk still only  around 13% below threshold for treatment.  Medication (bisphosphonate or prolia): Started on Fosamax in May 2022-we discussed 5-year treatment  Calcium: 1207m(  through diet ok) recommended - taking Vitamin D: 1000 units a day recommended- taking Last vitamin D Lab Results  Component Value Date   VD25OH 45.17 09/19/2020  A/P: Osteoporosis hopefully stable.  Continue calcium and vitamin D.  Continue Fosamax.  Discussed importance of weightbearing exercise- needs to up this   #hypertension S: medication: Lisinopril 5Mg daily  BP Readings from Last 3 Encounters:  03/30/21 121/76  11/06/20 112/74  09/19/20 118/82  A/P:  Controlled. Continue current medications.    Recommended follow up: Return in about 4 months (around 07/31/2021) for follow-up or sooner if needed since A1c has been high.. Future Appointments  Date Time Provider [place]  04/20/2021  8:30 AM [facility] 2 [facility]  04/08/2022  1:45 PM [facility]   Lab/Order associations: fasting   ICD-10-CM   1. Preventative health care  Z00.00     2. Type 2 diabetes mellitus with vascular disease ([place])  E11.59     3. Hyperlipidemia associated with type 2 diabetes mellitus ([facility])  E11.69 CBC with Differential/Platelet   E78.5 Comprehensive metabolic panel    Lipid panel    TSH    4. Controlled type 2 diabetes mellitus with diabetic polyneuropathy, without long-term current use of insulin ([facility])  E11.42 CBC with Differential/Platelet    Comprehensive metabolic panel    Lipid panel    Hemoglobin A1c    POCT Urinalysis Dipstick (Automated)    5. Age-related osteoporosis without current pathological fracture  M81.0     6. Pulmonary emphysema, unspecified emphysema type ([facility]) Chronic J43.9     7. Essential hypertension  I10      Meds ordered this encounter  Medications   metFORMIN (GLUCOPHAGE) 1000 MG tablet    Sig: TAKE 1 TABLET TWICE DAILY WITH A MEAL    Dispense:  180 tablet     Refill:  3   atorvastatin (LIPITOR) 20 MG tablet    Sig: Take 1 tablet (20 mg total) by mouth daily.    Dispense:  90 tablet    Refill:  3   lisinopril (ZESTRIL) 5 MG tablet    Sig: Take 1 tablet (5 mg total) by mouth daily.    Dispense:  90 tablet    Refill:  3    I,Jada Bradford,acting as a scribe for Garret Reddish, MD.,have documented all relevant documentation on the behalf of Garret Reddish, MD,as directed by  Garret Reddish, MD while in the presence of Garret Reddish, MD.  I, Garret Reddish, MD, have reviewed all documentation for this visit. The documentation on 03/30/21 for the exam, diagnosis, procedures, and orders are all accurate and complete.   Return precautions advised.  Garret Reddish, MD

## 2021-03-26 NOTE — Progress Notes (Addendum)
Virtual Visit via Telephone Note  I connected with  Samantha Snyder on 03/26/21 at  1:00 PM EDT by telephone and verified that I am speaking with the correct person using two identifiers.  Medicare Annual Wellness visit completed telephonically due to Covid-19 pandemic.   Persons participating in this call: This Health Coach and this patient.   Location: Patient: home Provider: office   I discussed the limitations, risks, security and privacy concerns of performing an evaluation and management service by telephone and the availability of in person appointments. The patient expressed understanding and agreed to proceed.  Unable to perform video visit due to video visit attempted and failed and/or patient does not have video capability.   Some vital signs may be absent or patient reported.   Willette Brace, LPN   Subjective:   Samantha Snyder is a 75 y.o. female who presents for Medicare Annual (Subsequent) preventive examination.  Review of Systems     Cardiac Risk Factors include: advanced age (>73mn, >>8women);diabetes mellitus;hypertension;dyslipidemia     Objective:    There were no vitals filed for this visit. There is no height or weight on file to calculate BMI.  Advanced Directives 03/26/2021 03/16/2020 03/15/2019 03/12/2018 03/11/2017 12/21/2015 12/07/2015  Does Patient Have a Medical Advance Directive? _0  Yes Yes  Type of AParamedicof [place] will Living will;Healthcare Power of AStowLiving will  Does patient want to make changes to medical advance directive? - - No - Patient declined - - - -  Copy of [place] Chart? Yes - validated most recent copy scanned in chart (See row information) Yes - validated most recent copy scanned in chart (See row information) Yes - validated most recent copy scanned in chart (See row information) -  - No - copy requested -    Current Medications (verified) Outpatient Encounter Medications as of 03/26/2021  Medication Sig   Alcohol Swabs PADS Use to clean area before checking blood sugars.   alendronate (FOSAMAX) 70 MG tablet Take 1 tablet (70 mg total) by mouth every 7 (seven) days. Take with a full glass of water on an empty stomach.   Alpha-Lipoic Acid 600 MG CAPS TAKE 1 CAPSULE (600 MG TOTAL) BY MOUTH DAILY.   aspirin 81 MG tablet Take 81 mg by mouth daily.   atorvastatin (LIPITOR) 20 MG tablet TAKE 1 TABLET EVERY DAY   Blood Glucose Monitoring Suppl (TRUE METRIX METER) w/Device KIT Use to test blood sugars daily. Dx: E11.9   Calcium Carbonate-Vit D-Min (CALCIUM 1200 PO) Take 1 tablet by mouth.   Cholecalciferol (VITAMIN D3) 2000 UNITS TABS Take 1 tablet by mouth.   fexofenadine (ALLEGRA) 180 MG tablet Take 180 mg by mouth daily.   glucose blood (TRUE METRIX BLOOD GLUCOSE TEST) test strip Use as instructed   ibuprofen (ADVIL,MOTRIN) 400 MG tablet Take 400 mg by mouth at bedtime as needed. Reported on 09/04/2015   lisinopril (ZESTRIL) 5 MG tablet TAKE 1 TABLET EVERY DAY   Magnesium 500 MG CAPS Take 1 capsule by mouth.   metFORMIN (GLUCOPHAGE) 1000 MG tablet TAKE 1 TABLET TWICE DAILY WITH A MEAL   Multiple Vitamins-Minerals (HAIR SKIN AND NAILS FORMULA PO) Take by mouth. With Biotin. 2 tablets daily.   Multiple Vitamins-Minerals (MULTIVITAMIN PO) Take 1 tablet by mouth.   Omega-3 Fatty Acids (FISH OIL PO) Take 2 tablets by mouth 2 (  two) times daily. 1,000 mg   OVER THE COUNTER MEDICATION Herbal tea 8 ounces daily   Probiotic Product (PROBIOTIC DAILY PO) Take 1 tablet by mouth.   TRUEplus Lancets 33G MISC Use to test blood sugars daily. Dx: E11.9   Wheat Dextrin (BENEFIBER PO) Take 1 tablet by mouth.   No facility-administered encounter medications on file as of 03/26/2021.    Allergies (verified) Fluoride preparations   History: Past Medical History:  Diagnosis Date   Allergy     minor hay fever   Arthritis    hands x 2, knee    Cataract    small   Chicken pox 02/24/2014   Has had shingles vaccine   Chronic diarrhea    For one year-needed prolonged course of antibiotics   Diabetes mellitus without complication ([place])    Diverticulosis 02/24/2014   Emphysema of lung (HCC)    mild per pt. 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 Improved urinary incontinence   Past Surgical History:  Procedure Laterality Date   ABDOMINAL HYSTERECTOMY  09-07-2013   ABDOMINAL SACROCOLPOPEXY  09-07-2013   BLADDER SURGERY  09/07/2013   vaginal prolapse   COLONOSCOPY  11-06-2005   sig tics, no polyps-[facility] [place]    CYSTOSCOPY  09-07-2013   ENTEROCELE REPAIR  09-07-2013   INDUCED ABORTION  1985   Therapeutic   LAPAROSCOPIC BILATERAL SALPINGO OOPHERECTOMY  09-07-2013   POLYPECTOMY     PUBOVAGINAL SLING  09-07-2013   TONSILLECTOMY Bilateral 1955   TUBAL LIGATION     Family History  Problem Relation Age of Onset   Cancer Father        Lung but was a smoker   Stroke Father        29 massive lead to death   Diabetes Father    Bladder Cancer Father    Lung cancer Father    Osteoporosis Mother    Anuerysm Mother    Diabetes Maternal Grandmother    Colon cancer Neg Hx    Colon polyps Neg Hx    Esophageal cancer Neg Hx    Rectal cancer Neg Hx    Stomach cancer Neg Hx    Breast cancer Neg Hx    Social History   Socioeconomic History   Marital status: Married    Spouse name: Not on file   Number of children: Not on file   Years of education: Not on file   Highest education level: Not on file  Occupational History   Occupation: retired  Tobacco Use   Smoking status: Every Day    Packs/day: 1.00    Years: 53.00    Pack  years: 53.00    Types: Cigarettes   Smokeless tobacco: Never   Tobacco comments:    Counseled that the single most powerful action she could take to decrease her risk of lung cancer is to quit smoking  Substance and Sexual Activity   Alcohol use: No    Alcohol/week: 0.0 standard drinks   Drug use: No   Sexual activity: Not on file  Other Topics Concern   Not on file  Social History Narrative   Married 63  years in 2015.  Moved to [place] to be near daughter who lives here. Has a son as well and 55 year old grandson. 3 granddogs. One dog at home       Retired former  Academic librarian. Has a BS in biology from [employer]. Used to be an algologist-studied algae under microscope      Hobbies-exercising with her husband      Social Determinants of Radio broadcast assistant Strain: Low Risk    Difficulty of Paying Living Expenses: Not hard at all  Food Insecurity: No Food Insecurity   Worried About Charity fundraiser in the Last Year: Never true   Arboriculturist in the Last Year: Never true  Transportation Needs: No Transportation Needs   Lack of Transportation (Medical): No   Lack of Transportation (Non-Medical): No  Physical Activity: Inactive   Days of Exercise per Week: 0 days   Minutes of Exercise per Session: 0 min  Stress: No Stress Concern Present   Feeling of Stress : Not at all  Social Connections: Moderately Isolated   Frequency of Communication with Friends and Family: More than three times a week   Frequency of Social Gatherings with Friends and Family: More than three times a week   Attends Religious Services: Never   Marine scientist or Organizations: No   Attends Music therapist: Never   Marital Status: Married    Tobacco Counseling Ready to quit: Not Answered Counseling given: Not Answered Tobacco comments: Counseled that the single most powerful action she could take to decrease her risk of lung cancer is to quit  smoking   Clinical Intake:  Pre-visit preparation completed: Yes  Pain : No/denies pain (neuropathy at all times in feet and legs)     BMI - recorded: 29.09 Nutritional Status: BMI 25 -29 Overweight Nutritional Risks: None Diabetes: Yes CBG done?: Yes (128) CBG resulted in Enter/ Edit results?: No Did pt. bring in CBG monitor from home?: No  How often do you need to have someone help you when you read instructions, pamphlets, or other written materials from your doctor or pharmacy?: 1 - Never  Diabetic?Nutrition Risk Assessment:  Has the patient had any N/V/D within the last 2 months?  No  Does the patient have any non-healing wounds?  No  Has the patient had any unintentional weight loss or weight gain?  No   Diabetes:  Is the patient diabetic?  Yes  If diabetic, was a CBG obtained today?  Yes  Did the patient bring in their glucometer from home?  No  How often do you monitor your CBG's? Daily.   Financial Strains and Diabetes Management:  Are you having any financial strains with the device, your supplies or your medication? No .  Does the patient want to be seen by Chronic Care Management for management of their diabetes?  No  Would the patient like to be referred to a Nutritionist or for Diabetic Management?  No   Diabetic Exams:  Diabetic Eye Exam: Completed 06/06/20 Diabetic Foot Exam: Completed 09/19/20   Interpreter Needed?: No  Information entered by :: Charlott Rakes, LPN   Activities of Daily Living In your present state of health, do you have any difficulty performing the following activities: 03/26/2021  Hearing? N  Vision? N  Difficulty concentrating or making decisions? N  Walking or climbing stairs? Y  Comment can but less as possible  Dressing or bathing? N  Doing errands,  shopping? N  Preparing Food and eating ? N  Using the Toilet? N  In the past six months, have you accidently leaked urine? N  Do you have problems with loss of bowel  control? N  Managing your Medications? N  Managing your Finances? N  Housekeeping or managing your Housekeeping? N  Some recent data might be hidden    Patient Care Team: Marin Olp, MD as PCP - General (Family Medicine) Celestia Khat, [place] as Consulting Physician (Optometry) Gerda Diss, DO as Consulting Physician (Sports Medicine)  Indicate any recent Medical Services you may have received from other than [facility] providers in the past year (date may be approximate).     Assessment:   This is a routine wellness examination for Samantha Snyder.  Hearing/Vision screen Hearing Screening - Comments:: Pt denies any hearing issues  Vision Screening - Comments:: Pt [facility] for annual eye exams   Dietary issues and exercise activities discussed: Current Exercise Habits: The patient does not participate in regular exercise at present   Goals Addressed             This Visit's Progress    Patient Stated       To start exercise and lose weight       Depression Screen PHQ 2/9 Scores 03/26/2021 09/19/2020 03/16/2020 07/19/2019 03/15/2019 12/29/2018 03/12/2018  PHQ - 2 Score 0 0 0 0 0 0 0    Fall Risk Fall Risk  03/26/2021 09/19/2020 03/16/2020 11/19/2019 03/15/2019  Falls in the past year? 1 1 0 0 1  Number falls in past yr: 1 0 0 0 0  Injury with Fall? 1 1 0 0 1  Comment right ring finger sprain - - - -  Risk for fall due to : Impaired vision;Impaired balance/gait - Impaired vision;Impaired mobility;Impaired balance/gait - -  Risk for fall due to: Comment - - related to neuropathy at time - -  Follow up Falls prevention discussed - Falls prevention discussed - -  Comment - - - - -    FALL RISK PREVENTION PERTAINING TO THE HOME:  Any stairs in or around the home? No  If so, are there any without handrails? No  Home free of loose throw rugs in walkways, pet beds, electrical cords, etc? Yes  Adequate lighting in your home to reduce risk of falls? Yes    ASSISTIVE DEVICES UTILIZED TO PREVENT FALLS:  Life alert? No  but has an apple watch Use of a cane, walker or w/c? No  Grab bars in the bathroom? Yes  Shower chair or bench in shower? No  Elevated toilet seat or a handicapped toilet? No   TIMED UP AND GO:  Was the test performed? No .  Cognitive Function: MMSE - Mini Mental State Exam 03/12/2018 03/11/2017  Not completed: (No Data) (No Data)     6CIT Screen 03/26/2021 03/16/2020 03/15/2019  What Year? 0 points 0 points 0 points  What month? 0 points 0 points 0 points  What time? 0 points - 0 points  Count back from 20 0 points 0 points 0 points  Months in reverse 0 points 0 points 0 points  Repeat phrase 0 points 0 points 0 points  Total Score 0 - 0    Immunizations Immunization History  Administered Date(s) Administered   Fluad Quad(high Dose 65+) 03/15/2019, 03/20/2020   Hepatitis A 02/28/1948   Hepatitis B 03/28/1947   Influenza, High Dose Seasonal PF 03/17/2014, 03/07/2016, 03/11/2017,  03/12/2018   Influenza,inj,Quad PF,6+ Mos 03/02/2015   Influenza-Unspecified 03/10/2010, 02/25/2012, 03/17/2013   MMR 05/14/1990, 05/14/1992   PFIZER Comirnaty(Gray Top)Covid-19 Tri-Sucrose Vaccine 10/25/2020   PFIZER(Purple Top)SARS-COV-2 Vaccination 08/15/2019, 09/08/2019, 04/23/2020, 10/25/2020   Pfizer Covid-19 Vaccine Bivalent Booster 68yr & up 03/06/2021   Pneumococcal Conjugate-13 03/17/2013, 06/07/2013   Pneumococcal Polysaccharide-23 09/19/2005, 02/24/2014   Pneumococcal-Unspecified 09/19/2005   Td 02/28/1950, 02/29/1956, 09/19/2005, 03/07/2016   Tdap 02/29/1960   Varicella 10/19/1991   Zoster Recombinat (Shingrix) 10/28/2016, 01/30/2017   Zoster, Live 02/28/2009    TDAP status: Up to date  Flu Vaccine status: Due, Education has been provided regarding the importance of this vaccine. Advised may receive this vaccine at local pharmacy or Health Dept. Aware to provide a copy of the vaccination record if obtained from local  pharmacy or Health Dept. Verbalized acceptance and understanding.  Pneumococcal vaccine status: Up to date  Covid-19 vaccine status: Completed vaccines  Qualifies for Shingles Vaccine? Yes   Zostavax completed Yes   Shingrix Completed?: Yes  Screening Tests Health Maintenance  Topic Date Due   INFLUENZA VACCINE  01/22/2021   COVID-19 Vaccine (5 - Booster for Pfizer series) 02/25/2021   HEMOGLOBIN A1C  03/22/2021   OPHTHALMOLOGY EXAM  06/06/2021   FOOT EXAM  09/19/2021   MAMMOGRAM  04/18/2022   COLONOSCOPY (Pts 45-437yrInsurance coverage will need to be confirmed)  01/21/2024   TETANUS/TDAP  03/07/2026   DEXA SCAN  Completed   Hepatitis C Screening  Completed   Zoster Vaccines- Shingrix  Completed   HPV VACCINES  Aged Out    Health Maintenance  Health Maintenance Due  Topic Date Due   INFLUENZA VACCINE  01/22/2021   COVID-19 Vaccine (5 - Booster for Pfizer series) 02/25/2021   HEMOGLOBIN A1C  03/22/2021    Colorectal cancer screening: Type of screening: Colonoscopy. Completed 01/20/18. Repeat every 5 years  Mammogram status: Completed 04/18/20. Repeat every year Scheduled 04/20/21  Bone Density status: Completed 10/26/20. Results reflect: Bone density results: OSTEOPOROSIS. Repeat every 2 years.   Additional Screening:  Hepatitis C Screening:  Completed 03/02/15  Vision Screening: Recommended annual ophthalmology exams for early detection of glaucoma and other disorders of the eye. Is the patient up to date with their annual eye exam?  Yes  Who is the provider or what is the name of the office in which the patient attends annual eye exams? [facility]  If pt is not established with a provider, would they like to be referred to a provider to establish care? No .   Dental Screening: Recommended annual dental exams for proper oral hygiene  Community Resource Referral / Chronic Care Management: CRR required this visit?  No   CCM required this visit?  No       Plan:     I have personally reviewed and noted the following in the patient's chart:   Medical and social history Use of alcohol, tobacco or illicit drugs  Current medications and supplements including opioid prescriptions.  Functional ability and status Nutritional status Physical activity Advanced directives List of other physicians Hospitalizations, surgeries, and ER visits in previous 12 months Vitals Screenings to include cognitive, depression, and falls Referrals and appointments  In addition, I have reviewed and discussed with patient certain preventive protocols, quality metrics, and best practice recommendations. A written personalized care plan for preventive services as well as general preventive health recommendations were provided to patient.     TiWillette BraceLPN   [id number]  Nurse Notes: None

## 2021-03-26 NOTE — Patient Instructions (Signed)
Samantha Snyder , Thank you for taking time to come for your Medicare Wellness Visit. I appreciate your ongoing commitment to your health goals. Please review the following plan we discussed and let me know if I can assist you in the future.   Screening recommendations/referrals: Colonoscopy: done 12/30/17 repeat every 5 years due 12/31/22 Mammogram: done 04/18/20 repeat every year scheduled 04/20/21 Bone Density: Done 10/26/20 repeat every 2 years  Recommended yearly ophthalmology/optometry visit for glaucoma screening and checkup Recommended yearly dental visit for hygiene and checkup  Vaccinations: Influenza vaccine: Due and discussed  Pneumococcal vaccine: Completed  Tdap vaccine: Done 03/07/16 repeat every 10 years due 03/07/26 Shingles vaccine: completed 5/7 & 01/30/17   Covid-19:completed 2/21, 3/17, 04/23/20 & 10/25/20, 9/13/2  Advanced directives: copies in chart  Conditions/risks identified: exercise and lose weight   Next appointment: Follow up in one year for your annual wellness visit    Preventive Care 65 Years and Older, Female Preventive care refers to lifestyle choices and visits with your health care provider that can promote health and wellness. What does preventive care include? A yearly physical exam. This is also called an annual well check. Dental exams once or twice a year. Routine eye exams. Ask your health care provider how often you should have your eyes checked. Personal lifestyle choices, including: Daily care of your teeth and gums. Regular physical activity. Eating a healthy diet. Avoiding tobacco and drug use. Limiting alcohol use. Practicing safe sex. Taking low-dose aspirin every day. Taking vitamin and mineral supplements as recommended by your health care provider. What happens during an annual well check? The services and screenings done by your health care provider during your annual well check will depend on your age, overall health, lifestyle risk  factors, and family history of disease. Counseling  Your health care provider may ask you questions about your: Alcohol use. Tobacco use. Drug use. Emotional well-being. Home and relationship well-being. Sexual activity. Eating habits. History of falls. Memory and ability to understand (cognition). Work and work Statistician. Reproductive health. Screening  You may have the following tests or measurements: Height, weight, and BMI. Blood pressure. Lipid and cholesterol levels. These may be checked every 5 years, or more frequently if you are over 29 years old. Skin check. Lung cancer screening. You may have this screening every year starting at age 3 if you have a 30-pack-year history of smoking and currently smoke or have quit within the past 15 years. Fecal occult blood test (FOBT) of the stool. You may have this test every year starting at age 65. Flexible sigmoidoscopy or colonoscopy. You may have a sigmoidoscopy every 5 years or a colonoscopy every 10 years starting at age 87. Hepatitis C blood test. Hepatitis B blood test. Sexually transmitted disease (STD) testing. Diabetes screening. This is done by checking your blood sugar (glucose) after you have not eaten for a while (fasting). You may have this done every 1-3 years. Bone density scan. This is done to screen for osteoporosis. You may have this done starting at age 54. Mammogram. This may be done every 1-2 years. Talk to your health care provider about how often you should have regular mammograms. Talk with your health care provider about your test results, treatment options, and if necessary, the need for more tests. Vaccines  Your health care provider may recommend certain vaccines, such as: Influenza vaccine. This is recommended every year. Tetanus, diphtheria, and acellular pertussis (Tdap, Td) vaccine. You may need a Td booster every 10  years. Zoster vaccine. You may need this after age 4. Pneumococcal 13-valent  conjugate (PCV13) vaccine. One dose is recommended after age 64. Pneumococcal polysaccharide (PPSV23) vaccine. One dose is recommended after age 80. Talk to your health care provider about which screenings and vaccines you need and how often you need them. This information is not intended to replace advice given to you by your health care provider. Make sure you discuss any questions you have with your health care provider. Document Released: 07/07/2015 Document Revised: 02/28/2016 Document Reviewed: 04/11/2015 Elsevier Interactive Patient Education  2017 [place] Prevention in the Home Falls can cause injuries. They can happen to people of all ages. There are many things you can do to make your home safe and to help prevent falls. What can I do on the outside of my home? Regularly fix the edges of walkways and driveways and fix any cracks. Remove anything that might make you trip as you walk through a door, such as a raised step or threshold. Trim any bushes or trees on the path to your home. Use bright outdoor lighting. Clear any walking paths of anything that might make someone trip, such as rocks or tools. Regularly check to see if handrails are loose or broken. Make sure that both sides of any steps have handrails. Any raised decks and porches should have guardrails on the edges. Have any leaves, snow, or ice cleared regularly. Use sand or salt on walking paths during winter. Clean up any spills in your garage right away. This includes oil or grease spills. What can I do in the bathroom? Use night lights. Install grab bars by the toilet and in the tub and shower. Do not use towel bars as grab bars. Use non-skid mats or decals in the tub or shower. If you need to sit down in the shower, use a plastic, non-slip stool. Keep the floor dry. Clean up any water that spills on the floor as soon as it happens. Remove soap buildup in the tub or shower regularly. Attach bath mats  securely with double-sided non-slip rug tape. Do not have throw rugs and other things on the floor that can make you trip. What can I do in the bedroom? Use night lights. Make sure that you have a light by your bed that is easy to reach. Do not use any sheets or blankets that are too big for your bed. They should not hang down onto the floor. Have a firm chair that has side arms. You can use this for support while you get dressed. Do not have throw rugs and other things on the floor that can make you trip. What can I do in the kitchen? Clean up any spills right away. Avoid walking on wet floors. Keep items that you use a lot in easy-to-reach places. If you need to reach something above you, use a strong step stool that has a grab bar. Keep electrical cords out of the way. Do not use floor polish or wax that makes floors slippery. If you must use wax, use non-skid floor wax. Do not have throw rugs and other things on the floor that can make you trip. What can I do with my stairs? Do not leave any items on the stairs. Make sure that there are handrails on both sides of the stairs and use them. Fix handrails that are broken or loose. Make sure that handrails are as long as the stairways. Check any carpeting to make sure  that it is firmly attached to the stairs. Fix any carpet that is loose or worn. Avoid having throw rugs at the top or bottom of the stairs. If you do have throw rugs, attach them to the floor with carpet tape. Make sure that you have a light switch at the top of the stairs and the bottom of the stairs. If you do not have them, ask someone to add them for you. What else can I do to help prevent falls? Wear shoes that: Do not have high heels. Have rubber bottoms. Are comfortable and fit you well. Are closed at the toe. Do not wear sandals. If you use a stepladder: Make sure that it is fully opened. Do not climb a closed stepladder. Make sure that both sides of the stepladder  are locked into place. Ask someone to hold it for you, if possible. Clearly mark and make sure that you can see: Any grab bars or handrails. First and last steps. Where the edge of each step is. Use tools that help you move around (mobility aids) if they are needed. These include: Canes. Walkers. Scooters. Crutches. Turn on the lights when you go into a dark area. Replace any light bulbs as soon as they burn out. Set up your furniture so you have a clear path. Avoid moving your furniture around. If any of your floors are uneven, fix them. If there are any pets around you, be aware of where they are. Review your medicines with your doctor. Some medicines can make you feel dizzy. This can increase your chance of falling. Ask your doctor what other things that you can do to help prevent falls. This information is not intended to replace advice given to you by your health care provider. Make sure you discuss any questions you have with your health care provider. Document Released: 04/06/2009 Document Revised: 11/16/2015 Document Reviewed: 07/15/2014 Elsevier Interactive Patient Education  2017 [employer].

## 2021-03-28 ENCOUNTER — Encounter: Payer: Self-pay | Admitting: Family Medicine

## 2021-03-30 ENCOUNTER — Encounter: Payer: Self-pay | Admitting: Family Medicine

## 2021-03-30 ENCOUNTER — Other Ambulatory Visit: Payer: Self-pay | Admitting: Family Medicine

## 2021-03-30 ENCOUNTER — Other Ambulatory Visit: Payer: Self-pay

## 2021-03-30 ENCOUNTER — Ambulatory Visit (INDEPENDENT_AMBULATORY_CARE_PROVIDER_SITE_OTHER): Payer: Medicare PPO | Admitting: Family Medicine

## 2021-03-30 VITALS — BP 121/76 | HR 77 | Temp 97.8°F | Wt 198.2 lb

## 2021-03-30 DIAGNOSIS — E785 Hyperlipidemia, unspecified: Secondary | ICD-10-CM

## 2021-03-30 DIAGNOSIS — E1142 Type 2 diabetes mellitus with diabetic polyneuropathy: Secondary | ICD-10-CM | POA: Diagnosis not present

## 2021-03-30 DIAGNOSIS — E1159 Type 2 diabetes mellitus with other circulatory complications: Secondary | ICD-10-CM

## 2021-03-30 DIAGNOSIS — Z0000 Encounter for general adult medical examination without abnormal findings: Secondary | ICD-10-CM

## 2021-03-30 DIAGNOSIS — I10 Essential (primary) hypertension: Secondary | ICD-10-CM | POA: Diagnosis not present

## 2021-03-30 DIAGNOSIS — M810 Age-related osteoporosis without current pathological fracture: Secondary | ICD-10-CM

## 2021-03-30 DIAGNOSIS — E1169 Type 2 diabetes mellitus with other specified complication: Secondary | ICD-10-CM

## 2021-03-30 DIAGNOSIS — J439 Emphysema, unspecified: Secondary | ICD-10-CM

## 2021-03-30 DIAGNOSIS — Z23 Encounter for immunization: Secondary | ICD-10-CM

## 2021-03-30 LAB — COMPREHENSIVE METABOLIC PANEL
ALT: 17 U/L (ref 0–35)
AST: 16 U/L (ref 0–37)
Albumin: 4.1 g/dL (ref 3.5–5.2)
Alkaline Phosphatase: 77 U/L (ref 39–117)
BUN: 22 mg/dL (ref 6–23)
CO2: 25 mEq/L (ref 19–32)
Calcium: 9.3 mg/dL (ref 8.4–10.5)
Chloride: 107 mEq/L (ref 96–112)
Creatinine, Ser: 0.55 mg/dL (ref 0.40–1.20)
GFR: 90.79 mL/min (ref >60.00)
Glucose, Bld: 140 mg/dL — ABNORMAL HIGH (ref 70–99)
Potassium: 4.1 mEq/L (ref 3.5–5.1)
Sodium: 140 mEq/L (ref 135–145)
Total Bilirubin: 0.5 mg/dL (ref 0.2–1.2)
Total Protein: 6.7 g/dL (ref 6.0–8.3)

## 2021-03-30 LAB — CBC WITH DIFFERENTIAL/PLATELET
Basophils Absolute: 0.1 10*3/uL (ref 0.0–0.1)
Basophils Relative: 1.1 % (ref 0.0–3.0)
Eosinophils Absolute: 0.1 10*3/uL (ref 0.0–0.7)
Eosinophils Relative: 1.3 % (ref 0.0–5.0)
HCT: 44.3 % (ref 36.0–46.0)
Hemoglobin: 14.6 g/dL (ref 12.0–15.0)
Lymphocytes Relative: 19.8 % (ref 12.0–46.0)
Lymphs Abs: 1.7 10*3/uL (ref 0.7–4.0)
MCHC: 32.9 g/dL (ref 30.0–36.0)
MCV: 90.8 fl (ref 78.0–100.0)
Monocytes Absolute: 0.6 10*3/uL (ref 0.1–1.0)
Monocytes Relative: 6.9 % (ref 3.0–12.0)
Neutro Abs: 6 10*3/uL (ref 1.4–7.7)
Neutrophils Relative %: 70.9 % (ref 43.0–77.0)
Platelets: 332 10*3/uL (ref 150.0–400.0)
RBC: 4.88 Mil/uL (ref 3.87–5.11)
RDW: 15.1 % (ref 11.5–15.5)
WBC: 8.4 10*3/uL (ref 4.0–10.5)

## 2021-03-30 LAB — HEMOGLOBIN A1C: Hgb A1c MFr Bld: 7.5 % — ABNORMAL HIGH (ref 4.6–6.5)

## 2021-03-30 LAB — LIPID PANEL
Cholesterol: 127 mg/dL (ref 0–200)
HDL: 43.9 mg/dL (ref >39.00)
LDL Cholesterol: 47 mg/dL (ref 0–99)
NonHDL: 83.53
Total CHOL/HDL Ratio: 3
Triglycerides: 181 mg/dL — ABNORMAL HIGH (ref 0.0–149.0)
VLDL: 36.2 mg/dL (ref 0.0–40.0)

## 2021-03-30 LAB — TSH: TSH: 1.49 u[IU]/mL (ref 0.35–5.50)

## 2021-03-30 MED ORDER — ATORVASTATIN CALCIUM 20 MG PO TABS: 20.0000 mg | ORAL_TABLET | Freq: Every day | ORAL | 3 refills | Status: DC

## 2021-03-30 MED ORDER — METFORMIN HCL 1000 MG PO TABS: ORAL_TABLET | ORAL | 3 refills | Status: DC

## 2021-03-30 MED ORDER — LISINOPRIL 5 MG PO TABS: 5.0000 mg | ORAL_TABLET | Freq: Every day | ORAL | 3 refills | Status: DC

## 2021-03-30 MED ORDER — OZEMPIC (0.25 OR 0.5 MG/DOSE) 2 MG/1.5ML ~~LOC~~ SOPN: PEN_INJECTOR | SUBCUTANEOUS | 0 refills | Status: DC

## 2021-03-30 NOTE — Patient Instructions (Addendum)
Health Maintenance Due  Topic Date Due   INFLUENZA VACCINE  -  - High dose flu shot done today in office.  01/22/2021   If we use Ozempic, please use their website, [email or web address] and click on How To Take Ozempic to see how to use/inject  Please stop by lab before you go If you have mychart- we will send your results within 3 business days of [place] receiving them.  If you do not have mychart- we will call you about results within 5 business days of [place] receiving them.  *please also note that you will see labs on mychart as soon as they post. I will later go in and write notes on them- will say "notes from Dr. Yong Channel"  Recommended follow up: Return in about 4 months (around 07/31/2021) for follow-up or sooner if needed since A1c has been high.[name]

## 2021-04-01 ENCOUNTER — Encounter: Payer: Self-pay | Admitting: Family Medicine

## 2021-04-02 ENCOUNTER — Other Ambulatory Visit: Payer: Medicare PPO

## 2021-04-02 ENCOUNTER — Other Ambulatory Visit: Payer: Self-pay

## 2021-04-02 LAB — POC URINALSYSI DIPSTICK (AUTOMATED)
Bilirubin, UA: NEGATIVE
Blood, UA: NEGATIVE
Glucose, UA: POSITIVE — AB
Ketones, UA: NEGATIVE
Leukocytes, UA: NEGATIVE
Nitrite, UA: POSITIVE
Protein, UA: NEGATIVE
Spec Grav, UA: 1.025 (ref 1.010–1.025)
Urobilinogen, UA: 0.2 E.U./dL
pH, UA: 6 (ref 5.0–8.0)

## 2021-04-02 NOTE — Addendum Note (Signed)
Addended by: Loura Back on: 04/02/2021 10:18 AM   Modules accepted: Orders

## 2021-04-06 ENCOUNTER — Encounter: Payer: Self-pay | Admitting: Family Medicine

## 2021-04-08 ENCOUNTER — Other Ambulatory Visit: Payer: Self-pay | Admitting: Family Medicine

## 2021-04-09 ENCOUNTER — Other Ambulatory Visit: Payer: Self-pay

## 2021-04-09 MED ORDER — METFORMIN HCL 1000 MG PO TABS: ORAL_TABLET | ORAL | 3 refills | Status: DC

## 2021-04-09 MED ORDER — LISINOPRIL 5 MG PO TABS: 5.0000 mg | ORAL_TABLET | Freq: Every day | ORAL | 3 refills | Status: DC

## 2021-04-09 MED ORDER — TRUEPLUS LANCETS 33G MISC: 3 refills | Status: DC

## 2021-04-09 MED ORDER — TRUE METRIX BLOOD GLUCOSE TEST VI STRP: ORAL_STRIP | 12 refills | Status: DC

## 2021-04-09 MED ORDER — ATORVASTATIN CALCIUM 20 MG PO TABS: 20.0000 mg | ORAL_TABLET | Freq: Every day | ORAL | 3 refills | Status: DC

## 2021-04-09 MED ORDER — ALCOHOL SWABS PADS: MEDICATED_PAD | 3 refills | Status: DC

## 2021-04-09 MED ORDER — TRUE METRIX METER W/DEVICE KIT: PACK | 3 refills | Status: DC

## 2021-04-20 ENCOUNTER — Ambulatory Visit
Admission: RE | Admit: 2021-04-20 | Discharge: 2021-04-20 | Disposition: A | Discharge status: Home / Self Care | Payer: Medicare PPO | Source: Ambulatory Visit | Attending: Family Medicine | Admitting: Family Medicine

## 2021-04-20 ENCOUNTER — Other Ambulatory Visit: Payer: Self-pay

## 2021-04-20 DIAGNOSIS — Z1231 Encounter for screening mammogram for malignant neoplasm of breast: Secondary | ICD-10-CM

## 2021-04-24 ENCOUNTER — Encounter: Payer: Self-pay | Admitting: Family Medicine

## 2021-04-24 MED ORDER — OZEMPIC (0.25 OR 0.5 MG/DOSE) 2 MG/1.5ML ~~LOC~~ SOPN: 0.5000 mg | PEN_INJECTOR | SUBCUTANEOUS | 1 refills | Status: DC

## 2021-04-24 NOTE — Telephone Encounter (Signed)
Pt called back told her Dr. Yong Channel wanted her to do Ozempic 0.25 mg x 5 weeks and then increase to 0.5 mg once a week. Told her new Rx was sent and she should continue 0.5 mg till next appt unless having problems. Pt verbalized understanding and said the Ozempic is working well bringing her sugars down. Told her good, call if any problems. Pt verbalized understanding.

## 2021-04-24 NOTE — Telephone Encounter (Signed)
Left message on voicemail to call office.  

## 2021-06-07 LAB — HM DIABETES EYE EXAM

## 2021-06-18 ENCOUNTER — Other Ambulatory Visit: Payer: Self-pay | Admitting: Family Medicine

## 2021-06-25 ENCOUNTER — Other Ambulatory Visit: Payer: Self-pay | Admitting: Family Medicine

## 2021-07-20 ENCOUNTER — Encounter: Payer: Self-pay | Admitting: Family Medicine

## 2021-07-27 NOTE — Progress Notes (Signed)
Phone [phone] In person visit   Subjective:   Samantha Snyder is a 76 y.o. year old very pleasant female patient who presents for/with See problem oriented charting Chief Complaint  Patient presents with   Follow-up   Diabetes    Pt states since starting ozempic she has had heartburn, constipation and diarrhea.   Hypertension   Hyperlipidemia    This visit occurred during the SARS-CoV-2 public health emergency.  Safety protocols were in place, including screening questions prior to the visit, additional usage of staff PPE, and extensive cleaning of exam room while observing appropriate contact time as indicated for disinfecting solutions.   Past Medical History-  Patient Active Problem List   Diagnosis Date Noted   DM (diabetes mellitus) type II controlled, neurological manifestation ([place]) 02/24/2014    Priority: High   Tobacco abuse 02/24/2014    Priority: High   Aortic atherosclerosis ([place]) 07/08/2017    Priority: Medium    Osteoporosis 03/30/2014    Priority: Medium    Essential hypertension 03/08/2014    Priority: Medium    Hyperlipidemia associated with type 2 diabetes mellitus ([place]) 02/24/2014    Priority: Medium    Emphysema lung ([place]) 12/19/2020    Priority: Low   Diabetic peripheral neuropathy ([place]) 10/27/2014    Priority: Low   Seasonal allergies 02/24/2014    Priority: Low    Medications- reviewed and updated Current Outpatient Medications  Medication Sig Dispense Refill   Alcohol Swabs PADS Use to clean area before checking blood sugars. 100 each 3   alendronate (FOSAMAX) 70 MG tablet Take 1 tablet (70 mg total) by mouth every 7 (seven) days. Take with a full glass of water on an empty stomach. 5 tablet 11   Alpha-Lipoic Acid 600 MG CAPS TAKE 1 CAPSULE (600 MG TOTAL) BY MOUTH DAILY. 60 capsule 5   aspirin 81 MG tablet Take 81 mg by mouth daily.     atorvastatin (LIPITOR) 20 MG tablet Take 1 tablet (20 mg total) by mouth daily. 90 tablet 3   Blood  Glucose Monitoring Suppl (TRUE METRIX METER) w/Device KIT Use to test blood sugars daily. Dx: E11.9 1 kit 3   Calcium Carbonate-Vit D-Min (CALCIUM 1200 PO) Take 1 tablet by mouth.     Cholecalciferol (VITAMIN D3) 2000 UNITS TABS Take 1 tablet by mouth.     fexofenadine (ALLEGRA) 180 MG tablet Take 180 mg by mouth daily.     glucose blood (TRUE METRIX BLOOD GLUCOSE TEST) test strip TEST BLOOD SUGAR  UP  TO FOUR TIMES DAILY 350 strip 0   ibuprofen (ADVIL,MOTRIN) 400 MG tablet Take 400 mg by mouth at bedtime as needed. Reported on 09/04/2015     lisinopril (ZESTRIL) 5 MG tablet Take 1 tablet (5 mg total) by mouth daily. 90 tablet 3   Magnesium 500 MG CAPS Take 1 capsule by mouth.     metFORMIN (GLUCOPHAGE) 1000 MG tablet TAKE 1 TABLET TWICE DAILY WITH A MEAL 180 tablet 3   Multiple Vitamins-Minerals (HAIR SKIN AND NAILS FORMULA PO) Take by mouth. With Biotin. 2 tablets daily.     Multiple Vitamins-Minerals (MULTIVITAMIN PO) Take 1 tablet by mouth.     Omega-3 Fatty Acids (FISH OIL PO) Take 2 tablets by mouth 2 (two) times daily. 1,000 mg     OVER THE COUNTER MEDICATION Herbal tea 8 ounces daily     OZEMPIC, 0.25 OR 0.5 MG/DOSE, 2 MG/1.5ML SOPN INJECT 0.5MG AS DIRECTED ONCE WEEKLY 1.5 mL 3  Probiotic Product (PROBIOTIC DAILY PO) Take 1 tablet by mouth.     TRUEplus Lancets 33G MISC TEST BLOOD SUGAR  UP  TO FOUR TIMES DAILY 400 each 0   Wheat Dextrin (BENEFIBER PO) Take 1 tablet by mouth.     No current facility-administered medications for this visit.     Objective:  BP 136/76    Pulse 81    Temp 98 F (36.7 C)    Ht _0  (1.753 m)    Wt 197 lb 3.2 oz (89.4 kg)    LMP  (LMP Unknown)    SpO2 97%    BMI 29.12 kg/m  Gen: NAD, resting comfortably CV: RRR no murmurs rubs or gallops Lungs: CTAB no crackles, wheeze, rhonchi Ext: trace to 1+ edema Skin: warm, dry  Diabetic Foot Exam - Simple   Simple Foot Form Diabetic Foot exam was performed with the following findings: Yes 08/03/2021  9:08 AM   Visual Inspection See comments: Yes Sensation Testing See comments: Yes Pulse Check See comments: Yes Comments Bilateral bunions. Callous at IT bilaterally on great toe. Hammertoe bilaterally- displaced on top of great toe on left foot.  Diminished sensation to monofilament at the toes - barely perceptible- able to feel at rest of foot      Assessment and Plan   #social update- they are celebrating already early for her birthday- 66 soon!  - some stress with daughter in law unfortunately  #hypertension S: medication: Lisinopril 5Mg daily Home readings #s: 130s at home BP Readings from Last 3 Encounters:  08/03/21 136/76  03/30/21 121/76  11/06/20 112/74  A/P: Controlled. Continue current medications.   #hyperlipidemia #Aortic atherosclerosis-LDL goal under 70. Incidental finding on imaging S: Medication: Atorvastatin 20Mg daily, aspirin 81 mg for primary prevention -She also prefers to take fish oil Lab Results  Component Value Date   CHOL 127 03/30/2021   HDL 43.90 03/30/2021   LDLCALC 47 03/30/2021   LDLDIRECT 73.0 03/11/2017   TRIG 181.0 (H) 03/30/2021   CHOLHDL 3 03/30/2021   A/P: Excellent control on last check-Continue current medication For aortic atherosclerosis-even at ideal goal less than 70 for LDL.  Presumed stable.  # Diabetes-A1c typically under 7 until 2022 #Diabetic neuropathy-alpha lipoic acid generally helpful but doesn't always take- upsets stomach,  also on voltaren gel . Required diabetic shoes/insoles S: Medication: Metformin 1000Mg twice daily, also on ozempic Ozempic has caused heartburn, constipation and diarrhea- doing same day as fosamax- is going to try to space to Wednesday. Issues are intermittent and not bad enough she feles she needs to stop CBGs- noted improvement on ozempic but variability Exercise and diet- doing some work in yard- picking up [employer]  Component Value Date   HGBA1C 7.5 (H) 03/30/2021   HGBA1C 7.2 (H)  09/19/2020   HGBA1C 6.6 (H) 03/20/2020   A/P: Hopefully improved-update A1c today-we discussed continuing metformin and ozempic but switching date of ozempic to see if that helps   #Osteoporosis S: Medication: Fosamax 70 mg started May 2022 on sunday Last DEXA: 10/26/2020 worst T score -2 point slightly worsened from -1.8 in 2019 at lumbar spine. Hip fracture risk at 4.9% low placing her in range to consider bisphosphonate with risk over 3%. Total fracture risk still only around 13% below threshold for treatment.  Calcium: 1211m (through diet ok) recommended - taking Vitamin D: 1000 units a day recommended- taking A/P:  hopefully stable - update bone density 2024. Continue fosamax, calcium, vitamin D- if reflux  doesn't get better could consider reclast.   #Tobacco abuse/COPD S:Emphysema was an incidental finding on CT.  She remains in the lung cancer screening program.  Currently smoking 1 PPD. UA last visit  A/P: Strongly encourage cessation-she is not ready to quit Emphysema is stable/asymptomatic-continue to monitor-hold off on inhalers unless symptomatic  Recommended follow up: Return in about 6 months (around 01/31/2022) for follow up- or sooner if needed. Unless a1c is above 7 Future Appointments  Date Time Provider [place]  04/08/2022  1:45 PM [facility]    Lab/Order associations:   ICD-10-CM   1. Type 2 diabetes mellitus with vascular disease (HCC)  E11.59 Comprehensive metabolic panel    Hemoglobin A1c    2. Aortic atherosclerosis (HCC)  I70.0     3. Hyperlipidemia associated with type 2 diabetes mellitus ([place])  E11.69    E78.5     4. Essential hypertension  I10     5. Pulmonary emphysema, unspecified emphysema type (HCC) Chronic J43.9     6. Age-related osteoporosis without current pathological fracture  M81.0     7. Controlled type 2 diabetes mellitus with diabetic polyneuropathy, without long-term current use of insulin (HCC)  E11.42      8. Diabetic peripheral neuropathy (HCC)  E11.42       No orders of the defined types were placed in this encounter.  I,Jada Bradford,acting as a scribe for Garret Reddish, MD.,have documented all relevant documentation on the behalf of Garret Reddish, MD,as directed by  Garret Reddish, MD while in the presence of Garret Reddish, MD.  I, Garret Reddish, MD, have reviewed all documentation for this visit. The documentation on 08/03/21 for the exam, diagnosis, procedures, and orders are all accurate and complete.  Return precautions advised.  Garret Reddish, MD

## 2021-08-03 ENCOUNTER — Ambulatory Visit: Payer: Medicare PPO | Admitting: Family Medicine

## 2021-08-03 ENCOUNTER — Encounter: Payer: Self-pay | Admitting: Family Medicine

## 2021-08-03 ENCOUNTER — Other Ambulatory Visit: Payer: Self-pay

## 2021-08-03 VITALS — BP 136/76 | HR 81 | Temp 98.0°F | Ht 69.0 in | Wt 197.2 lb

## 2021-08-03 DIAGNOSIS — E1169 Type 2 diabetes mellitus with other specified complication: Secondary | ICD-10-CM

## 2021-08-03 DIAGNOSIS — I700 Atherosclerosis of aorta: Secondary | ICD-10-CM

## 2021-08-03 DIAGNOSIS — E1142 Type 2 diabetes mellitus with diabetic polyneuropathy: Secondary | ICD-10-CM

## 2021-08-03 DIAGNOSIS — M810 Age-related osteoporosis without current pathological fracture: Secondary | ICD-10-CM

## 2021-08-03 DIAGNOSIS — M8588 Other specified disorders of bone density and structure, other site: Secondary | ICD-10-CM

## 2021-08-03 DIAGNOSIS — E1159 Type 2 diabetes mellitus with other circulatory complications: Secondary | ICD-10-CM

## 2021-08-03 DIAGNOSIS — J439 Emphysema, unspecified: Secondary | ICD-10-CM

## 2021-08-03 DIAGNOSIS — I10 Essential (primary) hypertension: Secondary | ICD-10-CM | POA: Diagnosis not present

## 2021-08-03 DIAGNOSIS — E785 Hyperlipidemia, unspecified: Secondary | ICD-10-CM | POA: Diagnosis not present

## 2021-08-03 LAB — COMPREHENSIVE METABOLIC PANEL
ALT: 12 U/L (ref 0–35)
AST: 12 U/L (ref 0–37)
Albumin: 3.8 g/dL (ref 3.5–5.2)
Alkaline Phosphatase: 83 U/L (ref 39–117)
BUN: 23 mg/dL (ref 6–23)
CO2: 30 mEq/L (ref 19–32)
Calcium: 9.6 mg/dL (ref 8.4–10.5)
Chloride: 107 mEq/L (ref 96–112)
Creatinine, Ser: 0.61 mg/dL (ref 0.40–1.20)
GFR: 88.34 mL/min (ref >60.00)
Glucose, Bld: 103 mg/dL — ABNORMAL HIGH (ref 70–99)
Potassium: 4.1 mEq/L (ref 3.5–5.1)
Sodium: 143 mEq/L (ref 135–145)
Total Bilirubin: 0.4 mg/dL (ref 0.2–1.2)
Total Protein: 6.4 g/dL (ref 6.0–8.3)

## 2021-08-03 LAB — HEMOGLOBIN A1C: Hgb A1c MFr Bld: 6.9 % — ABNORMAL HIGH (ref 4.6–6.5)

## 2021-08-03 NOTE — Patient Instructions (Addendum)
Please stop by lab before you go If you have mychart- we will send your results within 3 business days of [place] receiving them.  If you do not have mychart- we will call you about results within 5 business days of [place] receiving them.  *please also note that you will see labs on mychart as soon as they post. I will later go in and write notes on them- will say "notes from Dr. Yong Channel"   Move ozempic to [phone] to see if helps   Recommended follow up: Return in about 6 months (around 01/31/2022) for follow up- or sooner if needed. Unless a1c is above 7

## 2021-08-28 DIAGNOSIS — E1142 Type 2 diabetes mellitus with diabetic polyneuropathy: Secondary | ICD-10-CM | POA: Diagnosis not present

## 2021-08-29 DIAGNOSIS — E1149 Type 2 diabetes mellitus with other diabetic neurological complication: Secondary | ICD-10-CM | POA: Diagnosis not present

## 2021-08-29 DIAGNOSIS — E1142 Type 2 diabetes mellitus with diabetic polyneuropathy: Secondary | ICD-10-CM | POA: Diagnosis not present

## 2021-08-31 ENCOUNTER — Encounter: Payer: Self-pay | Admitting: Family Medicine

## 2021-09-23 ENCOUNTER — Other Ambulatory Visit: Payer: Self-pay | Admitting: Family Medicine

## 2021-10-02 ENCOUNTER — Encounter (HOSPITAL_BASED_OUTPATIENT_CLINIC_OR_DEPARTMENT_OTHER): Payer: Self-pay

## 2021-10-02 ENCOUNTER — Other Ambulatory Visit: Payer: Self-pay

## 2021-10-02 DIAGNOSIS — W010XXA Fall on same level from slipping, tripping and stumbling without subsequent striking against object, initial encounter: Secondary | ICD-10-CM | POA: Insufficient documentation

## 2021-10-02 DIAGNOSIS — Y92009 Unspecified place in unspecified non-institutional (private) residence as the place of occurrence of the external cause: Secondary | ICD-10-CM | POA: Insufficient documentation

## 2021-10-02 DIAGNOSIS — F17210 Nicotine dependence, cigarettes, uncomplicated: Secondary | ICD-10-CM | POA: Insufficient documentation

## 2021-10-02 DIAGNOSIS — E119 Type 2 diabetes mellitus without complications: Secondary | ICD-10-CM | POA: Insufficient documentation

## 2021-10-02 DIAGNOSIS — H538 Other visual disturbances: Secondary | ICD-10-CM | POA: Insufficient documentation

## 2021-10-02 DIAGNOSIS — Z7982 Long term (current) use of aspirin: Secondary | ICD-10-CM | POA: Diagnosis not present

## 2021-10-02 DIAGNOSIS — Z043 Encounter for examination and observation following other accident: Secondary | ICD-10-CM | POA: Diagnosis not present

## 2021-10-02 NOTE — ED Triage Notes (Signed)
Pt presents to the ED after a fall tonight. States that she was walking down the ramp at home and states that she couldn't stop and fell at the bottom. Denies hitting her head, denies LOC, and denies use of blood thinner. Pt denies injuries. Just wanted to get checked out.  ?

## 2021-10-03 ENCOUNTER — Encounter: Payer: Self-pay | Admitting: Family Medicine

## 2021-10-03 ENCOUNTER — Emergency Department (HOSPITAL_BASED_OUTPATIENT_CLINIC_OR_DEPARTMENT_OTHER)
Admission: EM | Admit: 2021-10-03 | Discharge: 2021-10-03 | Disposition: A | Discharge status: Home / Self Care | Payer: Medicare PPO | Attending: Emergency Medicine | Admitting: Emergency Medicine

## 2021-10-03 DIAGNOSIS — W010XXA Fall on same level from slipping, tripping and stumbling without subsequent striking against object, initial encounter: Secondary | ICD-10-CM

## 2021-10-03 NOTE — ED Provider Notes (Signed)
? ?[facility] ?Provider Note: Georgena Spurling, MD, [place] ? ?CSN: [id number] ?MRN: [id number] ?ARRIVAL: 10/02/21 at 2218 ?ROOM: [id number] ? ? ?CHIEF COMPLAINT  ?Fall ? ? ?HISTORY OF PRESENT ILLNESS  ?10/03/21 2:37 AM ?Samantha Snyder is a 76 y.o. female who was walking down a ramp at home yesterday evening and fell at the bottom.  She landed on her knees in the grass.  She did not hit her head denies any injuries elsewhere.  She just wants to be checked out.  She is not having any pain.  Her husband was concerned because she seemed a little dazed after the fall but this was brief and transient.  She states her tympanic membranes are difficult to visualize.  She has had no nausea or vomiting. ? ?Past Medical History:  ?Diagnosis Date  ? Allergy   ? minor hay fever  ? Arthritis   ? hands x 2, knee   ? Cataract   ? small  ? Chicken pox 02/24/2014  ? Has had shingles vaccine  ? Chronic diarrhea   ? For one year-needed prolonged course of antibiotics  ? Diabetes mellitus without complication ([place])   ? Diverticulosis 02/24/2014  ? Emphysema of lung ([place])   ? mild per pt. per x ray   ? History of UTI 2014  ? per pt noted after taking Bactrim due to tooth infection  ? Hyperlipidemia   ? Neuromuscular disorder ([place])   ? neuropathy both feet   ? Osteopenia   ? 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 Biopsied in 2008 and benign  ? Vaginal prolapse   ? Status post surgery. Improved urinary incontinence  ? ? ?Past Surgical History:  ?Procedure Laterality Date  ? ABDOMINAL HYSTERECTOMY  09/07/2013  ? ABDOMINAL SACROCOLPOPEXY  09/07/2013  ? BLADDER SURGERY  09/07/2013  ? vaginal prolapse  ? BREAST BIOPSY Right   ? 2014? near nipple. benign.  ? COLONOSCOPY  11/06/2005  ? sig tics, no polyps-[facility] [place]   ? CYSTOSCOPY  09/07/2013  ? ENTEROCELE REPAIR  09/07/2013  ? INDUCED ABORTION  1985  ? Therapeutic  ? LAPAROSCOPIC BILATERAL SALPINGO OOPHERECTOMY  09/07/2013  ? POLYPECTOMY    ? PUBOVAGINAL SLING  09/07/2013  ? TONSILLECTOMY  Bilateral 1955  ? TUBAL LIGATION    ? ? ?Family History  ?Problem Relation Age of Onset  ? Cancer Father   ?     Lung but was a smoker  ? Stroke Father   ?     49 massive lead to death  ? Diabetes Father   ? Bladder Cancer Father   ? Lung cancer Father   ? Osteoporosis Mother   ? Anuerysm Mother   ? Diabetes Maternal Grandmother   ? Colon cancer Neg Hx   ? Colon polyps Neg Hx   ? Esophageal cancer Neg Hx   ? Rectal cancer Neg Hx   ? Stomach cancer Neg Hx   ? Breast cancer Neg Hx   ? ? ?Social History  ? ?Tobacco Use  ? Smoking status: Every Day  ?  Packs/day: 1.00  ?  Years: 53.00  ?  Pack years: 53.00  ?  Types: Cigarettes  ? Smokeless tobacco: Never  ? Tobacco comments:  ?  Counseled that the single most powerful action she could take to decrease her risk of lung cancer is to quit smoking  ?Substance Use Topics  ? Alcohol use: No  ?  Alcohol/week: 0.0 standard drinks  ? Drug  use: No  ? ? ?Prior to Admission medications   ?Medication Sig Start Date End Date Taking? Authorizing Provider  ?Alcohol Swabs PADS Use to clean area before checking blood sugars. 04/09/21   Marin Olp, MD  ?alendronate (FOSAMAX) 70 MG tablet TAKE 1 TABLET BY MOUTH EVERY 7 DAYS. TAKE WITH A FULL GLASS OF WATER ON AN EMPTY STOMACH. 09/24/21   Marin Olp, MD  ?Alpha-Lipoic Acid 600 MG CAPS TAKE 1 CAPSULE (600 MG TOTAL) BY MOUTH DAILY. 11/08/20   Marin Olp, MD  ?aspirin 81 MG tablet Take 81 mg by mouth daily.    [provider]  ?atorvastatin (LIPITOR) 20 MG tablet Take 1 tablet (20 mg total) by mouth daily. 04/09/21   Marin Olp, MD  ?Blood Glucose Monitoring Suppl (TRUE METRIX METER) w/Device KIT Use to test blood sugars daily. Dx: E11.9 04/09/21   Marin Olp, MD  ?Calcium Carbonate-Vit D-Min (CALCIUM 1200 PO) Take 1 tablet by mouth.    [provider]  ?Cholecalciferol (VITAMIN D3) 2000 UNITS TABS Take 1 tablet by mouth.    [provider]  ?fexofenadine (ALLEGRA) 180 MG tablet Take  180 mg by mouth daily.    [provider]  ?glucose blood (TRUE METRIX BLOOD GLUCOSE TEST) test strip TEST BLOOD SUGAR  UP  TO FOUR TIMES DAILY 06/19/21   Marin Olp, MD  ?ibuprofen (ADVIL,MOTRIN) 400 MG tablet Take 400 mg by mouth at bedtime as needed. Reported on 09/04/2015    [provider]  ?lisinopril (ZESTRIL) 5 MG tablet Take 1 tablet (5 mg total) by mouth daily. 04/09/21   Marin Olp, MD  ?Magnesium 500 MG CAPS Take 1 capsule by mouth.    [provider]  ?metFORMIN (GLUCOPHAGE) 1000 MG tablet TAKE 1 TABLET TWICE DAILY WITH A MEAL 04/09/21   Marin Olp, MD  ?Multiple Vitamins-Minerals (HAIR SKIN AND NAILS FORMULA PO) Take by mouth. With Biotin. 2 tablets daily.    [provider]  ?Multiple Vitamins-Minerals (MULTIVITAMIN PO) Take 1 tablet by mouth.    [provider]  ?Omega-3 Fatty Acids (FISH OIL PO) Take 2 tablets by mouth 2 (two) times daily. 1,000 mg    [provider]  ?OVER THE COUNTER MEDICATION Herbal tea 8 ounces daily    [provider]  ?OZEMPIC, 0.25 OR 0.5 MG/DOSE, 2 MG/1.5ML SOPN INJECT 0.5MG AS DIRECTED ONCE WEEKLY 06/26/21   Marin Olp, MD  ?Probiotic Product (PROBIOTIC DAILY PO) Take 1 tablet by mouth.    [provider]  ?TRUEplus Lancets 33G MISC TEST BLOOD SUGAR  UP  TO FOUR TIMES DAILY 06/19/21   Marin Olp, MD  ?Wheat Dextrin (BENEFIBER PO) Take 1 tablet by mouth.    [provider]  ? ? ?Allergies ?Fluoride preparations ? ? ?REVIEW OF SYSTEMS  ?Negative except as noted here or in the History of Present Illness. ? ? ?PHYSICAL EXAMINATION  ?Initial Vital Signs ?Blood pressure (!) 159/85, pulse 92, temperature 99.4 ?F (37.4 ?C), temperature source Oral, resp. rate 18, SpO2 97 %. ? ?Examination ?General: Well-developed, well-nourished female in no acute distress; appearance consistent with age of record ?HENT: normocephalic; atraumatic; TMs not visualized ?Eyes: pupils equal,  round and reactive to light; extraocular muscles intact ?Neck: supple; nontender ?Heart: regular rate and rhythm ?Lungs: clear to auscultation bilaterally ?Chest: Nontender ?Abdomen: soft; nondistended; nontender; bowel sounds present ?Back: No spinal tenderness ?Extremities: No deformity; full range of motion; pulses normal; no  knee tenderness ?Neurologic: Awake, alert and oriented; motor function intact in all extremities and symmetric; no facial droop ?Skin: Warm and dry ?Psychiatric: Normal mood and affect ? ? ?RESULTS  ?Summary of this visit's results, reviewed and interpreted by myself: ? ? EKG Interpretation ? ?Date/Time:    ?Ventricular Rate:    ?PR Interval:    ?QRS Duration:   ?QT Interval:    ?QTC Calculation:   ?R Axis:     ?Text Interpretation:   ?  ? ?  ? ?Laboratory Studies: ?No results found for this or any previous visit (from the past 24 hour(s)). ?Imaging Studies: ?No results found. ? ?ED COURSE and MDM  ?Nursing notes, initial and subsequent vitals signs, including pulse oximetry, reviewed and interpreted by myself. ? ?Vitals:  ? 10/02/21 2232  ?BP: (!) 159/85  ?Pulse: 92  ?Resp: 18  ?Temp: 99.4 ?F (37.4 ?C)  ?TempSrc: Oral  ?SpO2: 97%  ? ?Medications - No data to display ? ?No evidence of injury on physical exam.  Patient is mentating normally.  I do not believe any radiographs or other diagnostic studies are indicated at this time. ? ?PROCEDURES  ?Procedures ? ? ?ED DIAGNOSES  ? ?  ICD-10-CM   ?1. Fall from slip, trip, or stumble, initial encounter  W01.0XXA   ?  ? ? ? ?  ?Shanon Rosser, MD ?10/03/21 0248 ? ?

## 2021-10-03 NOTE — ED Notes (Addendum)
Pt left prior to receiving discharge paperwork/instructions.  ?

## 2021-10-03 NOTE — ED Notes (Signed)
Dr. Florina Ou in room at same time of my assessment. After assessing patient, pt. denied any pain and stated she felt she didn't need xrays. Dr. Florina Ou asked if she wants to go home and patient stated she did. ?

## 2021-10-03 NOTE — ED Notes (Signed)
ED Provider at bedside. 

## 2021-10-03 NOTE — ED Notes (Signed)
Patient denies pain and is resting comfortably.  

## 2021-10-04 ENCOUNTER — Other Ambulatory Visit: Payer: Self-pay | Admitting: Family Medicine

## 2021-10-04 NOTE — Telephone Encounter (Signed)
There is nothing open unitl 5/5. Is there somewhere I can squeeze her in before then or is 5/5 ok? Please advise ?

## 2021-10-04 NOTE — Telephone Encounter (Signed)
Ok to squeeze her in or is it ok to wait? ?

## 2021-10-05 ENCOUNTER — Telehealth: Payer: Self-pay | Admitting: Family Medicine

## 2021-10-05 ENCOUNTER — Observation Stay (HOSPITAL_COMMUNITY)
Admission: EM | Admit: 2021-10-05 | Discharge: 2021-10-06 | Disposition: A | Discharge status: Home / Self Care | Payer: Medicare PPO | Attending: Family Medicine | Admitting: Family Medicine

## 2021-10-05 ENCOUNTER — Observation Stay (HOSPITAL_COMMUNITY)
Admission: RE | Admit: 2021-10-05 | Discharge: 2021-10-05 | Disposition: A | Discharge status: Home / Self Care | Payer: Medicare PPO | Source: Ambulatory Visit | Attending: Family | Admitting: Family

## 2021-10-05 ENCOUNTER — Encounter: Payer: Self-pay | Admitting: Family

## 2021-10-05 ENCOUNTER — Observation Stay (HOSPITAL_COMMUNITY): Payer: Medicare PPO

## 2021-10-05 ENCOUNTER — Other Ambulatory Visit: Payer: Self-pay

## 2021-10-05 ENCOUNTER — Ambulatory Visit: Payer: Medicare PPO | Admitting: Family

## 2021-10-05 ENCOUNTER — Encounter (HOSPITAL_COMMUNITY): Payer: Self-pay

## 2021-10-05 VITALS — BP 130/72 | HR 81 | Temp 98.2°F | Ht 69.0 in | Wt 192.5 lb

## 2021-10-05 DIAGNOSIS — F17210 Nicotine dependence, cigarettes, uncomplicated: Secondary | ICD-10-CM | POA: Insufficient documentation

## 2021-10-05 DIAGNOSIS — R410 Disorientation, unspecified: Secondary | ICD-10-CM | POA: Diagnosis not present

## 2021-10-05 DIAGNOSIS — S060X0A Concussion without loss of consciousness, initial encounter: Secondary | ICD-10-CM

## 2021-10-05 DIAGNOSIS — R519 Headache, unspecified: Secondary | ICD-10-CM | POA: Diagnosis not present

## 2021-10-05 DIAGNOSIS — Z7984 Long term (current) use of oral hypoglycemic drugs: Secondary | ICD-10-CM | POA: Insufficient documentation

## 2021-10-05 DIAGNOSIS — S0990XA Unspecified injury of head, initial encounter: Secondary | ICD-10-CM

## 2021-10-05 DIAGNOSIS — Z7982 Long term (current) use of aspirin: Secondary | ICD-10-CM | POA: Diagnosis not present

## 2021-10-05 DIAGNOSIS — E785 Hyperlipidemia, unspecified: Secondary | ICD-10-CM | POA: Diagnosis not present

## 2021-10-05 DIAGNOSIS — S065X0A Traumatic subdural hemorrhage without loss of consciousness, initial encounter: Principal | ICD-10-CM | POA: Insufficient documentation

## 2021-10-05 DIAGNOSIS — R269 Unspecified abnormalities of gait and mobility: Secondary | ICD-10-CM | POA: Diagnosis not present

## 2021-10-05 DIAGNOSIS — Z8679 Personal history of other diseases of the circulatory system: Secondary | ICD-10-CM | POA: Diagnosis present

## 2021-10-05 DIAGNOSIS — E1165 Type 2 diabetes mellitus with hyperglycemia: Secondary | ICD-10-CM | POA: Diagnosis not present

## 2021-10-05 DIAGNOSIS — R2681 Unsteadiness on feet: Secondary | ICD-10-CM | POA: Diagnosis not present

## 2021-10-05 DIAGNOSIS — W19XXXA Unspecified fall, initial encounter: Secondary | ICD-10-CM | POA: Diagnosis not present

## 2021-10-05 DIAGNOSIS — Z79899 Other long term (current) drug therapy: Secondary | ICD-10-CM | POA: Diagnosis not present

## 2021-10-05 DIAGNOSIS — S065XAA Traumatic subdural hemorrhage with loss of consciousness status unknown, initial encounter: Secondary | ICD-10-CM

## 2021-10-05 LAB — CBC WITH DIFFERENTIAL/PLATELET
Abs Immature Granulocytes: 0.06 10*3/uL (ref 0.00–0.07)
Basophils Absolute: 0.1 10*3/uL (ref 0.0–0.1)
Basophils Relative: 1 %
Eosinophils Absolute: 0.2 10*3/uL (ref 0.0–0.5)
Eosinophils Relative: 2 %
HCT: 45 % (ref 36.0–46.0)
Hemoglobin: 15 g/dL (ref 12.0–15.0)
Immature Granulocytes: 1 %
Lymphocytes Relative: 20 %
Lymphs Abs: 2.4 10*3/uL (ref 0.7–4.0)
MCH: 31 pg (ref 26.0–34.0)
MCHC: 33.3 g/dL (ref 30.0–36.0)
MCV: 93 fL (ref 80.0–100.0)
Monocytes Absolute: 0.9 10*3/uL (ref 0.1–1.0)
Monocytes Relative: 8 %
Neutro Abs: 8.1 10*3/uL — ABNORMAL HIGH (ref 1.7–7.7)
Neutrophils Relative %: 68 %
Platelets: 295 10*3/uL (ref 150–400)
RBC: 4.84 MIL/uL (ref 3.87–5.11)
RDW: 15 % (ref 11.5–15.5)
WBC: 11.8 10*3/uL — ABNORMAL HIGH (ref 4.0–10.5)
nRBC: 0 % (ref 0.0–0.2)

## 2021-10-05 LAB — BASIC METABOLIC PANEL
Anion gap: 7 (ref 5–15)
BUN: 20 mg/dL (ref 8–23)
CO2: 25 mmol/L (ref 22–32)
Calcium: 9.5 mg/dL (ref 8.9–10.3)
Chloride: 110 mmol/L (ref 98–111)
Creatinine, Ser: 0.66 mg/dL (ref 0.44–1.00)
GFR, Estimated: >60 mL/min (ref >60)
Glucose, Bld: 117 mg/dL — ABNORMAL HIGH (ref 70–99)
Potassium: 3.8 mmol/L (ref 3.5–5.1)
Sodium: 142 mmol/L (ref 135–145)

## 2021-10-05 MED ORDER — NAPROXEN 500 MG PO TABS: 500.0000 mg | ORAL_TABLET | Freq: Two times a day (BID) | ORAL | 0 refills | Status: DC

## 2021-10-05 NOTE — ED Provider Notes (Signed)
?[facility] DEPT ?Provider Note ? ? ?CSN: [id number] ?Arrival date & time: 10/05/21  1737 ? ?  ? ?History ? ?Chief Complaint  ?Patient presents with  ? Head Injury  ? ? ?Samantha Snyder is a 76 y.o. female. ? ?Patient fell couple days ago.  She does not think she hit her head.  She was seen by her primary care today and they got a CT scan around that shows a small subdural.  Patient has a history of diabetes ? ?The history is provided by the patient and medical records. No language interpreter was used.  ?Head Injury ?Location:  Frontal ?Mechanism of injury: fall   ?Fall:  ?  Point of impact:  Head ?  Entrapped after fall: no   ?Pain details:  ?  Quality:  Aching ?  Severity:  Mild ?  Timing:  Constant ?  Progression:  Waxing and waning ?Chronicity:  New ?Relieved by:  Nothing ?Worsened by:  Nothing ?Associated symptoms: headache   ?Associated symptoms: no seizures   ? ?  ? ?Home Medications ?Prior to Admission medications   ?Medication Sig Start Date End Date Taking? Authorizing Provider  ?Alcohol Swabs PADS Use to clean area before checking blood sugars. 04/09/21   Marin Olp, MD  ?alendronate (FOSAMAX) 70 MG tablet TAKE 1 TABLET BY MOUTH EVERY 7 DAYS. TAKE WITH A FULL GLASS OF WATER ON AN EMPTY STOMACH. 09/24/21   Marin Olp, MD  ?Alpha-Lipoic Acid 600 MG CAPS TAKE 1 CAPSULE (600 MG TOTAL) BY MOUTH DAILY. 11/08/20   Marin Olp, MD  ?aspirin 81 MG tablet Take 81 mg by mouth daily.    [provider]  ?atorvastatin (LIPITOR) 20 MG tablet Take 1 tablet (20 mg total) by mouth daily. 04/09/21   Marin Olp, MD  ?Blood Glucose Monitoring Suppl (TRUE METRIX METER) w/Device KIT Use to test blood sugars daily. Dx: E11.9 04/09/21   Marin Olp, MD  ?Calcium Carbonate-Vit D-Min (CALCIUM 1200 PO) Take 1 tablet by mouth.    [provider]  ?Cholecalciferol (VITAMIN D3) 2000 UNITS TABS Take 1 tablet by mouth.    [provider]   ?fexofenadine (ALLEGRA) 180 MG tablet Take 180 mg by mouth daily.    [provider]  ?glucose blood (TRUE METRIX BLOOD GLUCOSE TEST) test strip TEST BLOOD SUGAR  UP  TO FOUR TIMES DAILY 06/19/21   Marin Olp, MD  ?ibuprofen (ADVIL,MOTRIN) 400 MG tablet Take 400 mg by mouth at bedtime as needed. Reported on 09/04/2015    [provider]  ?lisinopril (ZESTRIL) 5 MG tablet Take 1 tablet (5 mg total) by mouth daily. 04/09/21   Marin Olp, MD  ?Magnesium 500 MG CAPS Take 1 capsule by mouth.    [provider]  ?metFORMIN (GLUCOPHAGE) 1000 MG tablet TAKE 1 TABLET TWICE DAILY WITH A MEAL 04/09/21   Marin Olp, MD  ?Multiple Vitamins-Minerals (HAIR SKIN AND NAILS FORMULA PO) Take by mouth. With Biotin. 2 tablets daily.    [provider]  ?Multiple Vitamins-Minerals (MULTIVITAMIN PO) Take 1 tablet by mouth.    [provider]  ?naproxen (NAPROSYN) 500 MG tablet Take 1 tablet (500 mg total) by mouth 2 (two) times daily with a meal. FOR headache. Do not take Ibuprofen with this. 10/05/21   Jeanie Sewer, NP  ?Omega-3 Fatty Acids (FISH OIL PO) Take 2 tablets by mouth 2 (two) times daily. 1,000 mg    [provider]  ?OVER THE COUNTER MEDICATION Herbal tea 8 ounces daily    [provider]  ?OZEMPIC, 0.25 OR 0.5 MG/DOSE, 2 MG/3ML SOPN INJECT 0.5MG AS DIRECTED ONCE WEEKLY 10/04/21   Marin Olp, MD  ?Probiotic Product (PROBIOTIC DAILY PO) Take 1 tablet by mouth.    [provider]  ?TRUEplus Lancets 33G MISC TEST BLOOD SUGAR  UP  TO FOUR TIMES DAILY 06/19/21   Marin Olp, MD  ?Wheat Dextrin (BENEFIBER PO) Take 1 tablet by mouth.    [provider]  ?   ? ?Allergies    ?Fluoride preparations   ? ?Review of Systems   ?Review of Systems  ?Constitutional:  Negative for appetite change and fatigue.  ?HENT:  Negative for congestion, ear discharge and sinus pressure.   ?Eyes:  Negative for discharge.  ?Respiratory:   Negative for cough.   ?Cardiovascular:  Negative for chest pain.  ?Gastrointestinal:  Negative for abdominal pain and diarrhea.  ?Genitourinary:  Negative for frequency and hematuria.  ?Musculoskeletal:  Negative for back pain.  ?Skin:  Negative for rash.  ?Neurological:  Positive for headaches. Negative for seizures.  ?Psychiatric/Behavioral:  Negative for hallucinations.   ? ?Physical Exam ?Updated Vital Signs ?BP 136/72 (BP Location: Left Arm)   Pulse 82   Temp 98.9 ?F (37.2 ?C) (Oral)   Resp 16   LMP  (LMP Unknown)   SpO2 97%  ?Physical Exam ?Vitals reviewed.  ?Constitutional:   ?   Appearance: She is well-developed.  ?HENT:  ?   Head: Normocephalic.  ?   Nose: Nose normal.  ?Eyes:  ?   General: No scleral icterus. ?   Conjunctiva/sclera: Conjunctivae normal.  ?Neck:  ?   Thyroid: No thyromegaly.  ?Cardiovascular:  ?   Rate and Rhythm: Normal rate and regular rhythm.  ?   Heart sounds: No murmur heard. ?  No friction rub. No gallop.  ?Pulmonary:  ?   Breath sounds: No stridor. No wheezing or rales.  ?Chest:  ?   Chest wall: No tenderness.  ?Abdominal:  ?   General: There is no distension.  ?   Tenderness: There is no abdominal tenderness. There is no rebound.  ?Musculoskeletal:     ?   General: Normal range of motion.  ?   Cervical back: Neck supple.  ?Lymphadenopathy:  ?   Cervical: No cervical adenopathy.  ?Skin: ?   Findings: No erythema or rash.  ?Neurological:  ?   Mental Status: She is oriented to person, place, and time.  ?   Motor: No abnormal muscle tone.  ?   Coordination: Coordination normal.  ?Psychiatric:     ?   Behavior: Behavior normal.  ? ? ?ED Results / Procedures / Treatments   ?Labs ?(all labs ordered are listed, but only abnormal results are displayed) ?Labs Reviewed  ?CBC WITH DIFFERENTIAL/PLATELET - Abnormal; Notable for the following components:  ?    Result Value  ? WBC 11.8 (*)   ? Neutro Abs 8.1 (*)   ? All other components within normal limits  ?BASIC METABOLIC PANEL - Abnormal;  Notable for the following components:  ? Glucose, Bld 117 (*)   ? All other components within normal limits  ? ? ?EKG ?None ? ?Radiology ?CT HEAD WO CONTRAST (5MM) ? ?Result Date: 10/05/2021 ?CLINICAL DATA:  Headache and confusion.  Fell 3 days ago. EXAM: CT HEAD WITHOUT CONTRAST TECHNIQUE: Contiguous axial images were obtained from the base of the skull through  the vertex without intravenous contrast. RADIATION DOSE REDUCTION: This exam was performed according to the departmental dose-optimization program which includes automated exposure control, adjustment of the mA and/or kV according to patient size and/or use of iterative reconstruction technique. COMPARISON:  None. FINDINGS: Brain: Acute right-sided subdural hematoma along the right cerebral convexity, falx, and right tentorium measuring up to 9 mm in thickness. No mass effect or midline shift. No evidence of acute infarction, intraparenchymal hemorrhage, hydrocephalus, or mass lesion. Vascular: Atherosclerotic vascular calcification of the carotid siphons. No hyperdense vessel. Skull: Normal. Negative for fracture or focal lesion. Sinuses/Orbits: No acute finding. Other: None. IMPRESSION: 1. Acute right-sided subdural hematoma without mass effect or midline shift. The CT tech who obtained this study is personally transporting the patient to the [facility] Emergency Room. Critical Value/emergent results were called by telephone at the time of interpretation on 10/05/2021 at 5:40 pm to provider Jaidin Richison, who verbally acknowledged these results. Electronically Signed   By: Titus Dubin M.D.   On: 10/05/2021 17:43   ? ?Procedures ?Procedures  ? ? ?Medications Ordered in ED ?Medications - No data to display ? ?ED Course/ Medical Decision Making/ A&P ?I spoke with neurosurgery and they stated the patient can be admitted to [facility] long hospital by the hospitalist and tomorrow morning she can have a repeat CT scan and if that has not changed she can  be discharged home with follow-up ?                        ?Medical Decision Making ?Amount and/or Complexity of Data Reviewed ?Labs: ordered. ? ?Risk ?Decision regarding hospitalization. ? ?This patient presents to th

## 2021-10-05 NOTE — Patient Instructions (Addendum)
It was very nice to see you today! ? ?I have sent over Naproxen to take INSTEAD of the Ibuprofen for next 3-5 days, if this is not working to relieve your headache, let us know.  ?You need to rest your brain! No overstimulating with concentrating, no TV, no reading, just rest!  ?Do NOT walk without assistance until you feel more steady, use a cane or walker. ?Refer to the handout attached. ? ?If your headache becomes severe, you start vomiting, become dizzy, or confused, then call 911 - do not go to the ER on your own. ? ? ? ? ? ? ? ?PLEASE NOTE: ? ?If you had any lab tests please let us know if you have not heard back within a few days. You may see your results on MyChart before we have a chance to review them but we will give you a call once they are reviewed by [place]. If we ordered any referrals today, please let us know if you have not heard from their office within the next week.  ? ?

## 2021-10-05 NOTE — Addendum Note (Signed)
Addended by: Verne Grain on: 10/05/2021 02:37 PM ? ? Modules accepted: Orders ? ?

## 2021-10-05 NOTE — Progress Notes (Addendum)
? ?Subjective:  ? ? ? Patient ID: Samantha Snyder, female    DOB: 1945-09-02, 76 y.o.   MRN: [id number] ? ?Chief Complaint  ?Patient presents with  ? Fall  ?  Pt fell on 4/11 she went to the ED and has been feeling weak, headaches and right arm/side pain. She does not remember if she hit her head.   ? ?HPI: ?Concussion:  pt fell 4 days ago, pt reports she fell forward on her knees and onto her right side hitting her head on the ground. She had no lump or pain in her head when seen in the ER so no scanning done. Since then she reports having headaches round the clock, taking Ibuprofen q4h, mild nausea, abnormal gait, husband reports she is confused at times, denies any dizziness, but feels weak and off-balance when walking. ? ?There are no preventive care reminders to display for this patient. ? ?Past Medical History:  ?Diagnosis Date  ? Allergy   ? minor hay fever  ? Arthritis   ? hands x 2, knee   ? Cataract   ? small  ? Chicken pox 02/24/2014  ? Has had shingles vaccine  ? Chronic diarrhea   ? For one year-needed prolonged course of antibiotics  ? Diabetes mellitus without complication ([place])   ? Diverticulosis 02/24/2014  ? Emphysema of lung ([place])   ? mild per pt. per x ray   ? History of UTI 2014  ? per pt noted after taking Bactrim due to tooth infection  ? Hyperlipidemia   ? Neuromuscular disorder ([place])   ? neuropathy both feet   ? Osteopenia   ? 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 Biopsied in 2008 and benign  ? Vaginal prolapse   ? Status post surgery. Improved urinary incontinence  ? ? ?Past Surgical History:  ?Procedure Laterality Date  ? ABDOMINAL HYSTERECTOMY  09/07/2013  ? ABDOMINAL SACROCOLPOPEXY  09/07/2013  ? BLADDER SURGERY  09/07/2013  ? vaginal prolapse  ? BREAST BIOPSY Right   ? 2014? near nipple. benign.  ? COLONOSCOPY  11/06/2005  ? sig tics, no polyps-[facility] [place]   ? CYSTOSCOPY  09/07/2013  ? ENTEROCELE REPAIR  09/07/2013  ? INDUCED ABORTION  1985  ? Therapeutic  ? LAPAROSCOPIC BILATERAL SALPINGO  OOPHERECTOMY  09/07/2013  ? POLYPECTOMY    ? PUBOVAGINAL SLING  09/07/2013  ? TONSILLECTOMY Bilateral 1955  ? TUBAL LIGATION    ? ? ?Outpatient Medications Prior to Visit  ?Medication Sig Dispense Refill  ? Alcohol Swabs PADS Use to clean area before checking blood sugars. 100 each 3  ? alendronate (FOSAMAX) 70 MG tablet TAKE 1 TABLET BY MOUTH EVERY 7 DAYS. TAKE WITH A FULL GLASS OF WATER ON AN EMPTY STOMACH. 12 tablet 3  ? Alpha-Lipoic Acid 600 MG CAPS TAKE 1 CAPSULE (600 MG TOTAL) BY MOUTH DAILY. 60 capsule 5  ? aspirin 81 MG tablet Take 81 mg by mouth daily.    ? atorvastatin (LIPITOR) 20 MG tablet Take 1 tablet (20 mg total) by mouth daily. 90 tablet 3  ? Blood Glucose Monitoring Suppl (TRUE METRIX METER) w/Device KIT Use to test blood sugars daily. Dx: E11.9 1 kit 3  ? Calcium Carbonate-Vit D-Min (CALCIUM 1200 PO) Take 1 tablet by mouth.    ? Cholecalciferol (VITAMIN D3) 2000 UNITS TABS Take 1 tablet by mouth.    ? fexofenadine (ALLEGRA) 180 MG tablet Take 180 mg by mouth daily.    ? glucose blood (TRUE  METRIX BLOOD GLUCOSE TEST) test strip TEST BLOOD SUGAR  UP  TO FOUR TIMES DAILY 350 strip 0  ? ibuprofen (ADVIL,MOTRIN) 400 MG tablet Take 400 mg by mouth at bedtime as needed. Reported on 09/04/2015    ? lisinopril (ZESTRIL) 5 MG tablet Take 1 tablet (5 mg total) by mouth daily. 90 tablet 3  ? Magnesium 500 MG CAPS Take 1 capsule by mouth.    ? metFORMIN (GLUCOPHAGE) 1000 MG tablet TAKE 1 TABLET TWICE DAILY WITH A MEAL 180 tablet 3  ? Multiple Vitamins-Minerals (HAIR SKIN AND NAILS FORMULA PO) Take by mouth. With Biotin. 2 tablets daily.    ? Multiple Vitamins-Minerals (MULTIVITAMIN PO) Take 1 tablet by mouth.    ? Omega-3 Fatty Acids (FISH OIL PO) Take 2 tablets by mouth 2 (two) times daily. 1,000 mg    ? OVER THE COUNTER MEDICATION Herbal tea 8 ounces daily    ? OZEMPIC, 0.25 OR 0.5 MG/DOSE, 2 MG/3ML SOPN INJECT 0.5MG AS DIRECTED ONCE WEEKLY 3 mL 5  ? Probiotic Product (PROBIOTIC DAILY PO) Take 1 tablet by  mouth.    ? TRUEplus Lancets 33G MISC TEST BLOOD SUGAR  UP  TO FOUR TIMES DAILY 400 each 0  ? Wheat Dextrin (BENEFIBER PO) Take 1 tablet by mouth.    ? ?No facility-administered medications prior to visit.  ? ? ?Allergies  ?Allergen Reactions  ? Fluoride Preparations Nausea Only  ?  Patient states it burnt her mouth and she had abdominal pain for 24-36 hours.   ? ? ? ?   ?Objective:  ?  ?Physical Exam ?Vitals and nursing note reviewed.  ?Constitutional:   ?   Appearance: Normal appearance.  ?HENT:  ?   Head: Contusion (right side, close to forehead area) present. No right periorbital erythema or left periorbital erythema.  ?Eyes:  ?   Extraocular Movements: Extraocular movements intact.  ?   Right eye: No nystagmus.  ?   Left eye: No nystagmus.  ?Cardiovascular:  ?   Rate and Rhythm: Normal rate and regular rhythm.  ?Pulmonary:  ?   Effort: Pulmonary effort is normal.  ?   Breath sounds: Normal breath sounds.  ?Musculoskeletal:     ?   General: Normal range of motion.  ?Skin: ?   General: Skin is warm and dry.  ?Neurological:  ?   Mental Status: She is alert and oriented to person, place, and time.  ?   Sensory: Sensation is intact.  ?   Motor: Weakness present. No tremor.  ?   Coordination: Coordination abnormal (slightly off-balance with ambulation, and slower than normal per pt and husband).  ?Psychiatric:     ?   Mood and Affect: Mood normal.     ?   Behavior: Behavior normal.  ? ? ?BP 130/72 (BP Location: Left Arm, Patient Position: Sitting, Cuff Size: Large)   Pulse 81   Temp 98.2 ?F (36.8 ?C) (Temporal)   Ht 5' 9"  (1.753 m)   Wt 192 lb 8 oz (87.3 kg)   LMP  (LMP Unknown)   SpO2 94%   BMI 28.43 kg/m?  ?Wt Readings from Last 3 Encounters:  ?10/05/21 192 lb 8 oz (87.3 kg)  ?08/03/21 197 lb 3.2 oz (89.4 kg)  ?03/30/21 198 lb 3.2 oz (89.9 kg)  ? ?   ?Assessment & Plan:  ? ?Problem List Items Addressed This Visit   ?None ?Visit Diagnoses   ? ? Concussion without loss of consciousness, initial encounter    -  Primary  ? pt fell 3 days ago, seen in ER, but no imaging done based on her complaints at the time. Since then she has had constant headache (take Ibuprofen q4h w/just mild relief) mild nausea, ataxia and she is walking very slowly which is not her normal walk, mild confusion, pain behind her eyes. Denies dizziness, and no nystagmus or neuro deficits on exam other than slow gait. Sending Naproxen, advised on use & SE & to stop the Ibuprofen. Ordering CT scan due to worsening sx. Advised pt on concussion protocol and handout provided. ? ?Relevant Medications  ? naproxen (NAPROSYN) 500 MG tablet  ? Other Relevant Orders  ? CT HEAD WO CONTRAST (5MM)  ? ?  ? ? ?Meds ordered this encounter  ?Medications  ? naproxen (NAPROSYN) 500 MG tablet  ?  Sig: Take 1 tablet (500 mg total) by mouth 2 (two) times daily with a meal. FOR headache. Do not take Ibuprofen with this.  ?  Dispense:  20 tablet  ?  Refill:  0  ?  Order Specific Question:   Supervising Provider  ?  Answer:   ANDY, CAMILLE L [[id number]]  ? ? ?Jeanie Sewer, NP ? ?

## 2021-10-05 NOTE — Telephone Encounter (Signed)
Patient seeing Jeanie Sewer today 4/14 at 1pm  ? ?Patient ?Name: ?Samantha Snyder ?CK ?Gender: Female ?DOB: March 18, 1946 ?Age: 76 Y 1 M 29 D ?Return ?Phone ?Number: ?[phone] ?(Primary) ?Address: ?City/ ?State/ ?Zip: ?[place] ? 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 She went ?to the ER , today she is dizzy and has difficulty ?walking ?Translation No ?Nurse Assessment ?Nurse: Altamease Oiler, RN, Adriana Date/Time (Eastern Time): 10/05/2021 8:03:54 AM ?Confirm and document reason for call. If ?symptomatic, describe symptoms. ?---caller states she fell on Tuesday. was seen in the ER ?(on [facility]). states she has had a sore spot in her ?right arm and right rib cage. pt reports being "woozy", ?states it is not dizziness. reports weakness and having ?to go slow. states she has had a headache twice since ?falling. reports flashes of light yesterday. denies pain ?anywhere else. states she doesn't think she hit her head. ?Does the patient have any new or worsening ?symptoms? ---Yes ?Will a triage be completed? ---Yes ?Related visit to physician within the last 2 weeks? ---Yes ?Does the PT have any chronic conditions? (i.e. ?diabetes, asthma, this includes High risk factors for ?pregnancy, etc.) ?---Yes ?List chronic conditions. ---diabetes htn high cholesterol on fosamax ?Is this a behavioral health or substance abuse call? ---No ?Guidelines ?Guideline Title Affirmed Question Affirmed Notes Nurse Date/Time (Eastern ?Time) ?Falls and Falling [1] MODERATE ?weakness (i.e., ?interferes with  work, ?school, normal ?Altamease Oiler, Therapist, sports, Fabio Bering 10/05/2021 8:09:19 ?AM ?PLEASE NOTE: All timestamps contained within this report are represented as [place] Standard Time. ?CONFIDENTIALTY NOTICE: This fax transmission is intended only for the addressee. It contains information that is legally privileged, confidential or ?otherwise protected from use or disclosure. If you are not the intended recipient, you are strictly prohibited from reviewing, disclosing, copying using ?or disseminating any of this information or taking any action in reliance on or regarding this information. If you have received this fax in error, please ?notify us immediately by telephone so that we can arrange for its return to [place]. Phone: [phone], Toll-Free: [phone], Fax: [phone] ?Page: 2 of 2 ?Call Id: [id number] ?Guidelines ?Guideline Title Affirmed Question Affirmed Notes Nurse Date/Time (Eastern ?Time) ?activities) AND ?[2] new-onset or ?worsening ?Disp. Time (Eastern ?Time) Disposition Final User ?10/05/2021 8:13:49 AM See HCP within 4 Hours (or ?PCP triage) ?Yes Altamease Oiler, RN, Fabio Bering ?Caller Disagree/Comply Comply ?Caller Understands Yes ?PreDisposition Call Doctor ?Care Advice Given Per Guideline ?SEE HCP (OR PCP TRIAGE) WITHIN 4 HOURS: * IF OFFICE WILL BE OPEN: You need to be seen within the next 3 or 4 ?hours. Call your doctor (or NP/PA) now or as soon as the office opens. CALL BACK IF: * You become worse CARE ADVICE ?given per Fall and Falling (Adult) guideline. ?

## 2021-10-05 NOTE — ED Notes (Signed)
Patient was assisted to the restroom. ?

## 2021-10-05 NOTE — Progress Notes (Signed)
Called in regards to this patients head CT. She apparently fell 3 days ago and has had persistent HA's. PCP ordered a Head CT. She is not on any blood thinners. CT shows small right convexity SDH as well as along the falx and right tentorium. No midline shift or mass effect. Recommend observation overnight with follow up head CT in the morning. Therapy as indicated for some gait instability.  ?

## 2021-10-05 NOTE — ED Triage Notes (Signed)
Pt arrived via POV, was seen at PCP office today after fall x3 days ago. Was called about CT results and told to go to ED. Pt endorses headache, denies any nausea.  ?

## 2021-10-06 ENCOUNTER — Observation Stay (HOSPITAL_COMMUNITY): Payer: Medicare PPO

## 2021-10-06 DIAGNOSIS — S065XAA Traumatic subdural hemorrhage with loss of consciousness status unknown, initial encounter: Secondary | ICD-10-CM | POA: Diagnosis not present

## 2021-10-06 DIAGNOSIS — Z79899 Other long term (current) drug therapy: Secondary | ICD-10-CM | POA: Diagnosis not present

## 2021-10-06 DIAGNOSIS — E1165 Type 2 diabetes mellitus with hyperglycemia: Secondary | ICD-10-CM | POA: Diagnosis not present

## 2021-10-06 DIAGNOSIS — R269 Unspecified abnormalities of gait and mobility: Secondary | ICD-10-CM | POA: Diagnosis not present

## 2021-10-06 DIAGNOSIS — Z7982 Long term (current) use of aspirin: Secondary | ICD-10-CM | POA: Diagnosis not present

## 2021-10-06 DIAGNOSIS — F17210 Nicotine dependence, cigarettes, uncomplicated: Secondary | ICD-10-CM | POA: Diagnosis not present

## 2021-10-06 DIAGNOSIS — E785 Hyperlipidemia, unspecified: Secondary | ICD-10-CM | POA: Diagnosis not present

## 2021-10-06 DIAGNOSIS — Z7984 Long term (current) use of oral hypoglycemic drugs: Secondary | ICD-10-CM | POA: Diagnosis not present

## 2021-10-06 DIAGNOSIS — S065X0A Traumatic subdural hemorrhage without loss of consciousness, initial encounter: Secondary | ICD-10-CM | POA: Diagnosis not present

## 2021-10-06 LAB — BASIC METABOLIC PANEL
Anion gap: 7 (ref 5–15)
BUN: 21 mg/dL (ref 8–23)
CO2: 24 mmol/L (ref 22–32)
Calcium: 9 mg/dL (ref 8.9–10.3)
Chloride: 111 mmol/L (ref 98–111)
Creatinine, Ser: 0.57 mg/dL (ref 0.44–1.00)
GFR, Estimated: >60 mL/min (ref >60)
Glucose, Bld: 111 mg/dL — ABNORMAL HIGH (ref 70–99)
Potassium: 3.7 mmol/L (ref 3.5–5.1)
Sodium: 142 mmol/L (ref 135–145)

## 2021-10-06 LAB — CBC
HCT: 42 % (ref 36.0–46.0)
Hemoglobin: 13.7 g/dL (ref 12.0–15.0)
MCH: 30.4 pg (ref 26.0–34.0)
MCHC: 32.6 g/dL (ref 30.0–36.0)
MCV: 93.1 fL (ref 80.0–100.0)
Platelets: 304 10*3/uL (ref 150–400)
RBC: 4.51 MIL/uL (ref 3.87–5.11)
RDW: 14.8 % (ref 11.5–15.5)
WBC: 10.1 10*3/uL (ref 4.0–10.5)
nRBC: 0 % (ref 0.0–0.2)

## 2021-10-06 LAB — MAGNESIUM: Magnesium: 2.1 mg/dL (ref 1.7–2.4)

## 2021-10-06 MED ORDER — ONDANSETRON HCL 4 MG/2ML IJ SOLN: 4.0000 mg | Freq: Four times a day (QID) | INTRAMUSCULAR | Status: DC | PRN

## 2021-10-06 MED ORDER — LISINOPRIL 5 MG PO TABS
5.0000 mg | ORAL_TABLET | Freq: Every day | ORAL | Status: DC
  Administered 2021-10-06: 5 mg via ORAL
  Filled 2021-10-06: qty 1

## 2021-10-06 MED ORDER — LISINOPRIL 5 MG PO TABS: 5.0000 mg | ORAL_TABLET | Freq: Every day | ORAL | Status: DC

## 2021-10-06 MED ORDER — INSULIN ASPART 100 UNIT/ML IJ SOLN
0.0000 [IU] | Freq: Three times a day (TID) | INTRAMUSCULAR | Status: DC
  Administered 2021-10-06: 1 [IU] via SUBCUTANEOUS

## 2021-10-06 MED ORDER — ALPHA-LIPOIC ACID 600 MG PO CAPS: 600.0000 mg | ORAL_CAPSULE | ORAL | Status: DC

## 2021-10-06 MED ORDER — MELATONIN 3 MG PO TABS: 3.0000 mg | ORAL_TABLET | Freq: Every evening | ORAL | Status: DC | PRN

## 2021-10-06 MED ORDER — POLYETHYLENE GLYCOL 3350 17 G PO PACK: 17.0000 g | PACK | Freq: Every day | ORAL | Status: DC | PRN

## 2021-10-06 MED ORDER — ATORVASTATIN CALCIUM 20 MG PO TABS
20.0000 mg | ORAL_TABLET | Freq: Every day | ORAL | Status: DC
  Administered 2021-10-06: 20 mg via ORAL
  Filled 2021-10-06: qty 1

## 2021-10-06 MED ORDER — INSULIN ASPART 100 UNIT/ML IJ SOLN: 0.0000 [IU] | Freq: Every day | INTRAMUSCULAR | Status: DC

## 2021-10-06 MED ORDER — ACETAMINOPHEN 325 MG PO TABS
650.0000 mg | ORAL_TABLET | Freq: Four times a day (QID) | ORAL | Status: DC | PRN
  Administered 2021-10-06: 650 mg via ORAL
  Filled 2021-10-06: qty 2

## 2021-10-06 NOTE — Evaluation (Signed)
Physical Therapy Evaluation ?Patient Details ?Name: Samantha Snyder ?MRN: [id number] ?DOB: 04/30/1946 ?Today's Date: 10/06/2021 ? ?History of Present Illness ? Samantha Snyder is a 76 y.o. female with medical history significant for type 2 diabetes, hyperlipidemia, who presented to [facility] ED as advised by her PCP due to abnormal head CT scan.  The patient suffered a concussion 3 days ago after a mechanical fall, no loss of consciousness, she went to the ED based upon her symptoms at that time she did not get a CT head.  Since her fall she has had new intermittent headaches, associated with gait abnormality.  She went to see her primary care provider on 4/14 due to worsening recurrent headaches and difficulty walking, a CT head was ordered.  The results showed subdural hematoma 9 mm with no midline shift.  ?Clinical Impression ? PATIENT DEMONSTRATES MILD FESTINATING GAIT WITH SHUFFLING. PATIENT REPORTS THIS IS DIFFERENT THAN PRIOR TO FALL. Patient has support from family. PT recommends OPPT for  neuro rehab, information   to call  clinic  provided.  Patient is DC'd today    .  ?   ? ?Recommendations for follow up therapy are one component of a multi-disciplinary discharge planning process, led by the attending physician.  Recommendations may be updated based on patient status, additional functional criteria and insurance authorization. ? ?Follow Up Recommendations Outpatient PT ? ?  ?Assistance Recommended at Discharge Set up Supervision/Assistance  ?Patient can return home with the following ? A little help with walking and/or transfers;Assistance with cooking/housework;Assist for transportation;Help with stairs or ramp for entrance ? ?  ?Equipment Recommendations None recommended by PT  ?Recommendations for Other Services ?    ?  ?Functional Status Assessment    ? ?  ?Precautions / Restrictions Precautions ?Precautions: Fall ?Restrictions ?Weight Bearing Restrictions: No  ? ?  ? ?Mobility ? Bed Mobility ?  ?  ?  ?  ?   ?  ?  ?General bed mobility comments: seatwd on bed edge ?  ? ?Transfers ?Overall transfer level: Needs assistance ?Equipment used: None ?  ?  ?  ?  ?  ?  ?  ?General transfer comment: min guard, wide base ?  ? ?Ambulation/Gait ?Ambulation/Gait assistance: Min assist ?Gait Distance (Feet): 100 Feet ?Assistive device: 1 person hand held assist ?Gait Pattern/deviations: Shuffle, Step-through pattern, Decreased stride length ?Gait velocity: decr ?  ?  ?General Gait Details: gait shuffling, relies on  HH support in open spaces and increased ditance ? ?Stairs ?  ?  ?  ?  ?  ? ?Wheelchair Mobility ?  ? ?Modified Rankin (Stroke Patients Only) ?  ? ?  ? ?Balance Overall balance assessment: Needs assistance, History of Falls ?Sitting-balance support: Feet supported, No upper extremity supported ?Sitting balance-Leahy Scale: Normal ?  ?  ?Standing balance support: During functional activity, No upper extremity supported ?Standing balance-Leahy Scale: Poor ?Standing balance comment: for dynamic, fair for static ?  ?  ?  ?  ?  ?  ?  ?  ?  ?  ?  ?   ? ? ? ?Pertinent Vitals/Pain Pain Assessment ?Pain Score: 3  ?Pain Location: headache ?Pain Descriptors / Indicators: Aching ?Pain Intervention(s): Premedicated before session  ? ? ?Home Living Family/patient expects to be discharged to:: Private residence ?Living Arrangements: Spouse/significant other ?Available Help at Discharge: Family ?Type of Home: House ?Home Access: Ramped entrance ?  ?Entrance Stairs-Number of Steps: 3 ?  ?Home Layout: One level ?Home Equipment: Grab bars -   tub/shower ?Additional Comments: pt. states shower is too small for a shower seat. pt. ed on toilet rails.  ?  ?Prior Function Prior Level of Function : Independent/Modified Independent ?  ?  ?  ?  ?  ?  ?  ?ADLs Comments: Pt. was mod i with adls and light iadls. Pt. husband assists. ?  ? ? ?Hand Dominance  ?   ? ?  ?Extremity/Trunk Assessment  ? Upper Extremity Assessment ?Upper Extremity Assessment:  Overall WFL for tasks assessed ?  ? ?Lower Extremity Assessment ?Lower Extremity Assessment: RLE deficits/detail;LLE deficits/detail;Generalized weakness ?RLE Sensation: history of peripheral neuropathy ?LLE Sensation: history of peripheral neuropathy ?  ? ?Cervical / Trunk Assessment ?Cervical / Trunk Assessment: Normal  ?Communication  ? Communication: No difficulties  ?Cognition   ?  ?  ?  ?  ?  ?  ?  ?  ?  ?  ?  ?  ?  ?  ?  ?  ?  ?  ?  ?  ?  ? ?  ?General Comments   ? ?  ?Exercises    ? ?Assessment/Plan  ?  ?PT Assessment All further PT needs can be met in the next venue of care (information for neuro REhab PT provided)  ?PT Problem List Decreased strength;Impaired sensation;Decreased balance;Decreased activity tolerance ? ?   ?  ?PT Treatment Interventions DME instruction;Therapeutic activities;Patient/family education;Functional mobility training;Gait training   ? ?PT Goals (Current goals can be found in the Care Plan section)  ?Acute Rehab PT Goals ?Patient Stated Goal: go home ?PT Goal Formulation: All assessment and education complete, DC therapy ? ?  ?Frequency   ?  ? ? ?Co-evaluation   ?  ?  ?  ?  ? ? ?  ?AM-PAC PT "6 Clicks" Mobility  ?Outcome Measure Help needed turning from your back to your side while in a flat bed without using bedrails?: None ?Help needed moving from lying on your back to sitting on the side of a flat bed without using bedrails?: None ?Help needed moving to and from a bed to a chair (including a wheelchair)?: A Little ?Help needed standing up from a chair using your arms (e.g., wheelchair or bedside chair)?: A Little ?Help needed to walk in hospital room?: A Little ?Help needed climbing 3-5 steps with a railing? : A Lot ?6 Click Score: 19 ? ?  ?End of Session Equipment Utilized During Treatment: Gait belt ?Activity Tolerance: Patient tolerated treatment well ?Patient left: in chair;with call bell/phone within reach;with family/visitor present ?Nurse Communication: Mobility  status ?PT Visit Diagnosis: Unsteadiness on feet (R26.81);Other symptoms and signs involving the nervous system (R29.898);Pain ?  ? ?Time: 0855-0920 ?PT Time Calculation (min) (ACUTE ONLY): 25 min ? ? ?Charges:   PT Evaluation ?$PT Eval Low Complexity: 1 Low ?PT Treatments ?$Gait Training: 8-22 mins ?  ?   ? ? ? Karen Hill PT ?Acute Rehabilitation Services ?Pager [phone] ?Office [phone] ? ? ?Hill, Karen Elizabeth ?10/06/2021, 11:56 AM ? ?

## 2021-10-06 NOTE — Discharge Summary (Signed)
?Physician Discharge Summary ?  ?Patient: Samantha Snyder MRN: [id number] DOB: [birth year]  ?Admit date:     10/05/2021  ?Discharge date: 10/06/2021  ?Discharge Physician: Patrecia Pour  ? ?PCP: Marin Olp, MD  ? ?Recommendations at discharge:  ?Continue outpatient PT for gait instability after patient was admitted for traumatic SDH that was stable on serial imaging.  ? ?Discharge Diagnoses: ?Principal Problem: ?  Subdural hematoma ([place]) ? ?Hospital Course: ?Chief Complaint: Fall 3 days ago with new headaches. ?  ?HPI: Samantha Snyder is a 76 y.o. female with a medical history significant for type 2 diabetes, hyperlipidemia, who presented to [facility] ED as advised by her PCP due to abnormal head CT scan.  The patient suffered a concussion 3 days ago after a mechanical fall, no loss of consciousness, she went to the ED based upon her symptoms at that time she did not get a CT head.  Since her fall she has had new intermittent headaches, associated with gait abnormality.  She went to see her primary care provider on 4/14 due to worsening recurrent headaches and difficulty walking, a CT head was ordered.  The results showed subdural hematoma 9 mm with no midline shift.  She was advised to go to the ED for further evaluation.  EDP discussed case with neurosurgery who recommended admission for observation and repeat CT scan.  Admitted by [facility], hospitalist service. ?  ?ED Course: Tmax 98.9.  BP 119/63, pulse 87, respiratory 16, oxygen saturation 94% on room air.  Lab studies essentially unremarkable except for mild leukocytosis with WBC 11.8K. ?  ?Hospital Course: Remained with a stable neurological exam. Serial CT scan showed stability in bleed with no expansion. PT worked with the patient who did well and was cleared for discharge home with family.  ? ?Assessment and Plan: ?Subdural hematoma post mechanical fall, POA: CT head 10/05/2021 showed small right convexity subdural hematoma, 9 mm, as well as along the falx and  right tentorium with no midline shift or mass effect. ?Repeat CT head showed stable acute right subdural hematoma without significant associated mass effect or midline shift.  No interval hemorrhage. ?- Hold off on home antiplatelet and home NSAIDs ?- No further work up or intervention recommended by neurosurgery ? ?Other chronic medical conditions were stable during short period of observation. ? ?Consultants: Neurosurgery, PT ?Procedures performed: None  ?Disposition: Home ?Diet recommendation:  ?Discharge Diet Orders (From admission, onward)  ? ?  Start     Ordered  ? 10/06/21 0000  Diet - low sodium heart healthy       ? 10/06/21 1145  ? ?  ?  ? ?  ? ?Cardiac diet ?DISCHARGE MEDICATION: ?Allergies as of 10/06/2021   ? ?   Reactions  ? Fluoride Preparations Nausea Only  ? Patient states it burned her mouth and she had abdominal pain for 24-36 hours (dental office)  ? ?  ? ?  ?Medication List  ?  ? ?STOP taking these medications   ? ?aspirin 81 MG tablet ?  ?ibuprofen 400 MG tablet ?Commonly known as: ADVIL ?  ?naproxen 500 MG tablet ?Commonly known as: Naprosyn ?  ? ?  ? ?TAKE these medications   ? ?Alcohol Swabs Pads ?Use to clean area before checking blood sugars. ?  ?alendronate 70 MG tablet ?Commonly known as: FOSAMAX ?TAKE 1 TABLET BY MOUTH EVERY 7 DAYS. TAKE WITH A FULL GLASS OF WATER ON AN EMPTY STOMACH. ?What changed: See the new instructions. ?  ?  Alpha-Lipoic Acid 600 MG Caps ?TAKE 1 CAPSULE (600 MG TOTAL) BY MOUTH DAILY. ?What changed: when to take this ?  ?atorvastatin 20 MG tablet ?Commonly known as: LIPITOR ?Take 1 tablet (20 mg total) by mouth daily. ?  ?CALCIUM 600 PO ?Take 1,200 mg by mouth at bedtime. ?  ?fexofenadine 180 MG tablet ?Commonly known as: ALLEGRA ?Take 180 mg by mouth daily. ?  ?FISH OIL PO ?Take 2,000 mg by mouth in the morning and at bedtime. ?  ?HAIR SKIN AND NAILS FORMULA PO ?Take 2 tablets by mouth daily. ?  ?lisinopril 5 MG tablet ?Commonly known as: ZESTRIL ?Take 1 tablet (5 mg  total) by mouth daily. ?  ?Magnesium 500 MG Caps ?Take 500 mg by mouth at bedtime. ?  ?metFORMIN 1000 MG tablet ?Commonly known as: GLUCOPHAGE ?TAKE 1 TABLET TWICE DAILY WITH A MEAL ?What changed:  ?how much to take ?how to take this ?when to take this ?additional instructions ?  ?MULTIVITAMIN PO ?Take 1 tablet by mouth at bedtime. ?  ?OVER THE COUNTER MEDICATION ?Take 240 mLs by mouth See admin instructions. Herbal tea- Drink 8 ounces by mouth daily ?  ?Ozempic (0.25 or 0.5 MG/DOSE) 2 MG/3ML Sopn ?Generic drug: Semaglutide(0.25 or 0.5MG/DOS) ?INJECT 0.5MG AS DIRECTED ONCE WEEKLY ?What changed: See the new instructions. ?  ?PROBIOTIC DAILY PO ?Take 1 capsule by mouth every evening. ?  ?True Metrix Blood Glucose Test test strip ?Generic drug: glucose blood ?TEST BLOOD SUGAR  UP  TO FOUR TIMES DAILY ?  ?True Metrix Meter w/Device Kit ?Use to test blood sugars daily. Dx: E11.9 ?  ?TRUEplus Lancets 33G Misc ?TEST BLOOD SUGAR  UP  TO FOUR TIMES DAILY ?  ?Vitamin D3 50 MCG (2000 UT) Tabs ?Take 2,000 Units by mouth at bedtime. ?  ?Voltaren 1 % Gel ?Generic drug: diclofenac Sodium ?Apply 2 g topically 4 (four) times daily as needed (to painful sites of feet and hands). ?  ? ?  ? ? Follow-up Information   ? ? Marin Olp, MD. Schedule an appointment as soon as possible for a visit.   ?Specialty: Family Medicine ?Contact information: ?[address] ?[place] [address] ?[phone] ? ? ?  ?  ? ?  ?  ? ?  ? ?Discharge Exam: ?BP 123/72 (BP Location: Left Arm)   Pulse 72   Temp 98.7 ?F (37.1 ?C) (Oral)   Resp 16   Ht 5' 9"  (1.753 m)   LMP  (LMP Unknown)   SpO2 95%   BMI 28.43 kg/m?   ?Pleasant, interactive female in no distress ?Clear, nonlabored ?RRR with minimal edema ?Alert, oriented, negative rhomberg, no pronator drift. CN's intact on testing without nystagmus and normal pupillary responses. Stands unassisted with minimal sway. Gait slow. Extremities have intact motor and sensory  function ? ?Condition at discharge: good ? ?The results of significant diagnostics from this hospitalization (including imaging, microbiology, ancillary and laboratory) are listed below for reference.  ? ?Imaging Studies: ?CT HEAD WO CONTRAST (5MM) ? ?Result Date: 10/06/2021 ?CLINICAL DATA:  Subdural hematoma, follow-up examination EXAM: CT HEAD WITHOUT CONTRAST TECHNIQUE: Contiguous axial images were obtained from the base of the skull through the vertex without intravenous contrast. RADIATION DOSE REDUCTION: This exam was performed according to the departmental dose-optimization program which includes automated exposure control, adjustment of the mA and/or kV according to patient size and/or use of iterative reconstruction technique. COMPARISON:  10/05/2021 FINDINGS: Brain: Acute right subdural hematoma layering along the right cerebral convexity,  falx, and right tentorium is unchanged measuring up to 5 mm in thickness along the convexity and up to 9-10 mm in thickness along the falx. No interval hemorrhage. No significant associated mass effect. No midline shift. Mild parenchymal volume loss is commensurate with the patient's age. No acute infarct. Ventricular size is normal. Cerebellum is unremarkable. Vascular: No asymmetric hyperdense vasculature at the skull base. Skull: Intact Sinuses/Orbits: No acute finding. Other: Mastoid air cells and middle ear cavities are clear. IMPRESSION: Stable acute right subdural hematoma without significant associated mass effect or midline shift. No interval hemorrhage. Electronically Signed   By: Fidela Salisbury M.D.   On: 10/06/2021 01:26  ? ?CT HEAD WO CONTRAST (5MM) ? ?Result Date: 10/05/2021 ?CLINICAL DATA:  Headache and confusion.  Fell 3 days ago. EXAM: CT HEAD WITHOUT CONTRAST TECHNIQUE: Contiguous axial images were obtained from the base of the skull through the vertex without intravenous contrast. RADIATION DOSE REDUCTION: This exam was performed according to the  departmental dose-optimization program which includes automated exposure control, adjustment of the mA and/or kV according to patient size and/or use of iterative reconstruction technique. COMPARISON:  None. FINDINGS: Brain: Acute rig

## 2021-10-06 NOTE — H&P (Addendum)
History and Physical  Temprence Body UJW:[id number] DOB: 11/06/1945 DOA: 10/05/2021  Referring physician: Dr. Estell Harpin, EDP  PCP: Shelva Majestic, MD  Outpatient Specialists: None Patient coming from: Home  Chief Complaint: Fall 3 days ago with new headaches.  HPI: Samantha Snyder is a 76 y.o. female with medical history significant for type 2 diabetes, hyperlipidemia, who presented to [facility] ED as advised by her PCP due to abnormal head CT scan.  The patient suffered a concussion 3 days ago after a mechanical fall, no loss of consciousness, she went to the ED based upon her symptoms at that time she did not get a CT head.  Since her fall she has had new intermittent headaches, associated with gait abnormality.  She went to see her primary care provider on 4/14 due to worsening recurrent headaches and difficulty walking, a CT head was ordered.  The results showed subdural hematoma 9 mm with no midline shift.  She was advised to go to the ED for further evaluation.  EDP discussed case with neurosurgery who recommended admission for observation and repeat CT scan.  Admitted by [facility], hospitalist service.  ED Course: Tmax 98.9.  BP 119/63, pulse 87, respiratory 16, oxygen saturation 94% on room air.  Lab studies essentially unremarkable except for mild leukocytosis with WBC 11.8K.  Review of Systems: Review of systems as noted in the HPI. All other systems reviewed and are negative.   Past Medical History:  Diagnosis Date   Allergy    minor hay fever   Arthritis    hands x 2, knee    Cataract    small   Chicken pox 02/24/2014   Has had shingles vaccine   Chronic diarrhea    For one year-needed prolonged course of antibiotics   Diabetes mellitus without complication (HCC)    Diverticulosis 02/24/2014   Emphysema of lung (HCC)    mild per pt. per x ray    History of UTI 2014   per pt noted after taking Bactrim due to tooth infection   Hyperlipidemia    Neuromuscular disorder (HCC)     neuropathy both feet    Osteopenia    Right adrenal mass (HCC)    Biopsied in 2008 and benign   Vaginal prolapse    Status post surgery. Improved urinary incontinence   Past Surgical History:  Procedure Laterality Date   ABDOMINAL HYSTERECTOMY  09/07/2013   ABDOMINAL SACROCOLPOPEXY  09/07/2013   BLADDER SURGERY  09/07/2013   vaginal prolapse   BREAST BIOPSY Right    2014? near nipple. benign.   COLONOSCOPY  11/06/2005   sig tics, no polyps-[facility]    CYSTOSCOPY  09/07/2013   ENTEROCELE REPAIR  09/07/2013   INDUCED ABORTION  1985   Therapeutic   LAPAROSCOPIC BILATERAL SALPINGO OOPHERECTOMY  09/07/2013   POLYPECTOMY     PUBOVAGINAL SLING  09/07/2013   TONSILLECTOMY Bilateral 1955   TUBAL LIGATION      Social History:  reports that she has been smoking cigarettes. She has a 53.00 pack-year smoking history. She has never used smokeless tobacco. She reports that she does not drink alcohol and does not use drugs.   Allergies  Allergen Reactions   Fluoride Preparations Nausea Only    Patient states it burned her mouth and she had abdominal pain for 24-36 hours (dental office)    Family History  Problem Relation Age of Onset   Cancer Father        Lung but  was a smoker   Stroke Father        14 massive lead to death   Diabetes Father    Bladder Cancer Father    Lung cancer Father    Osteoporosis Mother    Anuerysm Mother    Diabetes Maternal Grandmother    Colon cancer Neg Hx    Colon polyps Neg Hx    Esophageal cancer Neg Hx    Rectal cancer Neg Hx    Stomach cancer Neg Hx    Breast cancer Neg Hx       Prior to Admission medications   Medication Sig Start Date End Date Taking? Authorizing Provider  alendronate (FOSAMAX) 70 MG tablet TAKE 1 TABLET BY MOUTH EVERY 7 DAYS. TAKE WITH A FULL GLASS OF WATER ON AN EMPTY STOMACH. Patient taking differently: Take 70 mg by mouth every Sunday. 09/24/21  Yes Shelva Majestic, MD  Alpha-Lipoic Acid 600 MG CAPS TAKE 1  CAPSULE (600 MG TOTAL) BY MOUTH DAILY. Patient taking differently: Take 600 mg by mouth every other day. 11/08/20  Yes Shelva Majestic, MD  aspirin 81 MG tablet Take 81 mg by mouth in the morning.   Yes [provider]  atorvastatin (LIPITOR) 20 MG tablet Take 1 tablet (20 mg total) by mouth daily. 04/09/21  Yes Shelva Majestic, MD  Calcium Carbonate (CALCIUM 600 PO) Take 1,200 mg by mouth at bedtime.   Yes [provider]  Cholecalciferol (VITAMIN D3) 2000 UNITS TABS Take 2,000 Units by mouth at bedtime.   Yes [provider]  diclofenac Sodium (VOLTAREN) 1 % GEL Apply 2 g topically 4 (four) times daily as needed (to painful sites of feet and hands).   Yes [provider]  fexofenadine (ALLEGRA) 180 MG tablet Take 180 mg by mouth daily.   Yes [provider]  ibuprofen (ADVIL,MOTRIN) 400 MG tablet Take 400 mg by mouth at bedtime.   Yes [provider]  lisinopril (ZESTRIL) 5 MG tablet Take 1 tablet (5 mg total) by mouth daily. 04/09/21  Yes Shelva Majestic, MD  Magnesium 500 MG CAPS Take 500 mg by mouth at bedtime.   Yes [provider]  metFORMIN (GLUCOPHAGE) 1000 MG tablet TAKE 1 TABLET TWICE DAILY WITH A MEAL Patient taking differently: Take 1,000 mg by mouth 2 (two) times daily after a meal. 04/09/21  Yes Shelva Majestic, MD  Multiple Vitamins-Minerals (HAIR SKIN AND NAILS FORMULA PO) Take 2 tablets by mouth daily.   Yes [provider]  Multiple Vitamins-Minerals (MULTIVITAMIN PO) Take 1 tablet by mouth at bedtime.   Yes [provider]  Omega-3 Fatty Acids (FISH OIL PO) Take 2,000 mg by mouth in the morning and at bedtime.   Yes [provider]  OVER THE COUNTER MEDICATION Take 240 mLs by mouth See admin instructions. Herbal tea- Drink 8 ounces by mouth daily   Yes [provider]  OZEMPIC, 0.25 OR 0.5 MG/DOSE, 2 MG/3ML SOPN INJECT 0.5MG  AS DIRECTED ONCE WEEKLY Patient taking  differently: Inject 0.5 mg into the skin every Wednesday. 10/04/21  Yes Shelva Majestic, MD  Probiotic Product (PROBIOTIC DAILY PO) Take 1 capsule by mouth every evening.   Yes [provider]  Alcohol Swabs PADS Use to clean area before checking blood sugars. 04/09/21   Shelva Majestic, MD  Blood Glucose Monitoring Suppl (TRUE METRIX METER) w/Device KIT Use to test blood sugars daily. Dx: E11.9 04/09/21   Shelva Majestic, MD  glucose blood (TRUE METRIX BLOOD GLUCOSE TEST) test strip TEST BLOOD SUGAR  UP  TO FOUR TIMES DAILY 06/19/21   Shelva Majestic, MD  naproxen (NAPROSYN) 500 MG tablet Take 1 tablet (500 mg total) by mouth 2 (two) times daily with a meal. FOR headache. Do not take Ibuprofen with this. Patient not taking: Reported on 10/05/2021 10/05/21   Dulce Sellar, NP  TRUEplus Lancets 33G MISC TEST BLOOD SUGAR  UP  TO FOUR TIMES DAILY 06/19/21   Shelva Majestic, MD    Physical Exam: BP 119/63 (BP Location: Left Arm)   Pulse 87   Temp 98.7 F (37.1 C) (Oral)   Resp 18   Ht 5\' 9"  (1.753 m)   LMP  (LMP Unknown)   SpO2 94%   BMI 28.43 kg/m   General: 76 y.o. year-old female well developed well nourished in no acute distress.  Alert and oriented x3. Cardiovascular: Regular rate and rhythm with no rubs or gallops.  No thyromegaly or JVD noted.  No lower extremity edema. 2/4 pulses in all 4 extremities. Respiratory: Clear to auscultation with no wheezes or rales. Good inspiratory effort. Abdomen: Soft nontender nondistended with normal bowel sounds x4 quadrants. Muskuloskeletal: No cyanosis, clubbing or edema noted bilaterally Neuro: CN II-XII intact, strength, sensation, reflexes Skin: No ulcerative lesions noted or rashes Psychiatry: Judgement and insight appear normal. Mood is appropriate for condition and setting          Labs on Admission:  Basic Metabolic Panel: Recent Labs  Lab 10/05/21 1826  NA 142  K 3.8  CL 110  CO2 25  GLUCOSE 117*  BUN 20   CREATININE 0.66  CALCIUM 9.5   Liver Function Tests: No results for input(s): AST, ALT, ALKPHOS, BILITOT, PROT, ALBUMIN in the last 168 hours. No results for input(s): LIPASE, AMYLASE in the last 168 hours. No results for input(s): AMMONIA in the last 168 hours. CBC: Recent Labs  Lab 10/05/21 1826  WBC 11.8*  NEUTROABS 8.1*  HGB 15.0  HCT 45.0  MCV 93.0  PLT 295   Cardiac Enzymes: No results for input(s): CKTOTAL, CKMB, CKMBINDEX, TROPONINI in the last 168 hours.  BNP (last 3 results) No results for input(s): BNP in the last 8760 hours.  ProBNP (last 3 results) No results for input(s): PROBNP in the last 8760 hours.  CBG: No results for input(s): GLUCAP in the last 168 hours.  Radiological Exams on Admission: CT HEAD WO CONTRAST ( )  Result Date: 10/06/2021 CLINICAL DATA:  Subdural hematoma, follow-up examination EXAM: CT HEAD WITHOUT CONTRAST TECHNIQUE: Contiguous axial images were obtained from the base of the skull through the vertex without intravenous contrast. RADIATION DOSE REDUCTION: This exam was performed according to the departmental dose-optimization program which includes automated exposure control, adjustment of the mA and/or kV according to patient size and/or use of iterative reconstruction technique. COMPARISON:  10/05/2021 FINDINGS: Brain: Acute right subdural hematoma layering along the right cerebral convexity, falx, and right tentorium is unchanged measuring up to 5 mm in thickness along the convexity and up to 9-10 mm in thickness along the falx. No interval hemorrhage. No significant associated mass effect. No midline shift. Mild parenchymal volume loss is commensurate with the patient's age. No acute infarct. Ventricular size is normal. Cerebellum is unremarkable. Vascular: No asymmetric hyperdense vasculature at the skull base. Skull: Intact Sinuses/Orbits: No acute finding. Other: Mastoid air cells and middle ear cavities are clear. IMPRESSION: Stable  acute right subdural hematoma without significant associated mass  effect or midline shift. No interval hemorrhage. Electronically Signed   By: Helyn Numbers M.D.   On: 10/06/2021 01:26   CT HEAD WO CONTRAST ( )  Result Date: 10/05/2021 CLINICAL DATA:  Headache and confusion.  Fell 3 days ago. EXAM: CT HEAD WITHOUT CONTRAST TECHNIQUE: Contiguous axial images were obtained from the base of the skull through the vertex without intravenous contrast. RADIATION DOSE REDUCTION: This exam was performed according to the departmental dose-optimization program which includes automated exposure control, adjustment of the mA and/or kV according to patient size and/or use of iterative reconstruction technique. COMPARISON:  None. FINDINGS: Brain: Acute right-sided subdural hematoma along the right cerebral convexity, falx, and right tentorium measuring up to 9 mm in thickness. No mass effect or midline shift. No evidence of acute infarction, intraparenchymal hemorrhage, hydrocephalus, or mass lesion. Vascular: Atherosclerotic vascular calcification of the carotid siphons. No hyperdense vessel. Skull: Normal. Negative for fracture or focal lesion. Sinuses/Orbits: No acute finding. Other: None. IMPRESSION: 1. Acute right-sided subdural hematoma without mass effect or midline shift. The CT tech who obtained this study is personally transporting the patient to the [facility] Emergency Room. Critical Value/emergent results were called by telephone at the time of interpretation on 10/05/2021 at 5:40 pm to provider JOSEPH ZAMMIT, who verbally acknowledged these results. Electronically Signed   By: Obie Dredge M.D.   On: 10/05/2021 17:43    EKG: I independently viewed the EKG done and my findings are as followed: None available at the time of this visit.  Ordered.  Assessment/Plan Present on Admission:  Subdural hematoma (HCC)  Principal Problem:   Subdural hematoma (HCC)  Subdural hematoma post mechanical  fall, POA Hold off on home antiplatelet and home NSAIDs CT head 10/05/2021 showed small right convexity subdural hematoma, 9 mm, as well as along the falx and right tentorium with no midline shift or mass effect. Repeat CT head showed stable acute right subdural hematoma without significant associated mass effect or midline shift.  No interval hemorrhage. Goal BP normotensive less than 130/80. Neurochecks every 4 hours  Gait instability PT OT to assess Fall precautions  Essential hypertension Maintain goal normotensive Resume home lisinopril Closely monitor vital signs  Hyperlipidemia Resume home regimen, low-dose Lipitor 20 mg daily  Type 2 diabetes with hyperglycemia Hemoglobin A1c 6.9 on 08/03/2021 Hold off home oral hypoglycemics Insulin sliding scale   DVT prophylaxis: SCDs, pharmacological DVT prophylaxis contraindicated in the setting of intracranial bleed.  Code Status: Full code  Family Communication: Husband at bedside.  Disposition Plan: Admitted to telemetry unit  Consults called: Neurosurgery  Admission status: Observation status   Status is: Observation    Darlin Drop MD [facility] Pager [phone]  If 7PM-7AM, please contact night-coverage [email or web address] Password [facility]  10/06/2021, 2:55 AM

## 2021-10-06 NOTE — Progress Notes (Signed)
Pt. Was seen for OT to assess for needs. Pt. Was able to preform ADL transfers and ADLs without assist or LOB with use of RW. Pt. Did not have any weakness in either UE. Pt. Was alert and oriented and able to follow directions. Pt. Does not have any OT needs at this time and pt. Is in agreement. One time evaluation and OT to sign off.  ? 10/06/21 1000  ?OT Visit Information  ?Last OT Received On 10/06/21  ?Assistance Needed +1  ?History of Present Illness Samantha Snyder is a 76 y.o. female with medical history significant for type 2 diabetes, hyperlipidemia, who presented to [facility] ED as advised by her PCP due to abnormal head CT scan.  The patient suffered a concussion 3 days ago after a mechanical fall, no loss of consciousness, she went to the ED based upon her symptoms at that time she did not get a CT head.  Since her fall she has had new intermittent headaches, associated with gait abnormality.  She went to see her primary care provider on 4/14 due to worsening recurrent headaches and difficulty walking, a CT head was ordered.  The results showed subdural hematoma 9 mm with no midline shift.  ?Precautions  ?Precautions Fall  ?Restrictions  ?Weight Bearing Restrictions No  ?Home Living  ?Family/patient expects to be discharged to: Private residence  ?Living Arrangements Spouse/significant other  ?Available Help at Discharge Family  ?Type of Home House  ?Bathroom Shower/Tub Walk-in shower  ?Bathroom Toilet Handicapped height  ?Home Equipment Grab bars - tub/shower  ?Additional Comments pt. states shower is too small for a shower seat. pt. ed on toilet rails.  ?Prior Function  ?Prior Level of Function  Independent/Modified Independent  ?ADLs Comments Pt. was mod i with adls and light iadls. Pt. husband assists.  ?Communication  ?Communication No difficulties  ?Pain Assessment  ?Pain Assessment 0-10  ?Pain Score 3  ?Pain Location headache  ?Pain Descriptors / Indicators Aching  ?Pain Intervention(s) Limited  activity within patient's tolerance;Premedicated before session  ?Cognition  ?Arousal/Alertness Awake/alert  ?Behavior During Therapy [facility] for tasks assessed/performed  ?Overall Cognitive Status Within Functional Limits for tasks assessed  ?Upper Extremity Assessment  ?Upper Extremity Assessment Overall WFL for tasks assessed  ?Vision- History  ?Baseline Vision/History 1 Wears glasses  ?Ability to See in Adequate Light 0 Adequate  ?Patient Visual Report No change from baseline  ?Vision- Assessment  ?Vision Assessment? No apparent visual deficits  ?ADL  ?Overall ADL's  Modified independent  ?General ADL Comments no changes from baseline  ?Transfers  ?Overall transfer level Modified independent  ?Equipment used Rolling walker (2 wheels)  ?General transfer comment Pt. ed on proper hand placement  ?OT - End of Session  ?Equipment Utilized During [employer] walker (2 wheels)  ?Activity Tolerance Patient tolerated treatment well  ?Patient left in chair;with call bell/phone within reach;with family/visitor present  ?Nurse Communication  ?(ok therapy)  ?OT Assessment  ?OT Recommendation/Assessment Patient does not need any further OT services  ?AM-PAC OT "6 Clicks" Daily Activity Outcome Measure (Version 2)  ?Help from another person eating meals? 4  ?Help from another person taking care of personal grooming? 4  ?Help from another person toileting, which includes using toliet, bedpan, or urinal? 4  ?Help from another person bathing (including washing, rinsing, drying)? 4  ?Help from another person to put on and taking off regular upper body clothing? 4  ?Help from another person to put on and taking off regular lower body  clothing? 4  ?6 Click Score 24  ?Progressive Mobility  ?What is the highest level of mobility based on the progressive mobility assessment? Level 6 (Walks independently in room and hall) - Balance while walking in room without assist - Complete  ?Activity Ambulated independently in room  ?OT  Recommendation  ?Follow Up Recommendations No OT follow up  ?Individuals Consulted  ?Consulted and Agree with Results and Recommendations Patient  ?Acute Rehab OT Goals  ?Patient Stated Goal to go home  ?OT Goal Formulation With patient  ?OT Time Calculation  ?OT Start Time (ACUTE ONLY) 1033  ?OT Stop Time (ACUTE ONLY) 1056  ?OT Time Calculation (min) 23 min  ?OT General Charges  ?$OT Visit 1 Visit  ?OT Evaluation  ?$OT Eval Moderate Complexity 1 Mod  ? ?Reece Packer OT/L ? ?

## 2021-10-06 NOTE — Progress Notes (Signed)
Discharge instructions provided to and reviewed with patient and patient's husband.  PIV and cardiac monitoring removed.  Patient escorted to main entrance with belongings for transport home with husband. ? ?Angie Fava, RN  ?

## 2021-10-06 NOTE — TOC Transition Note (Signed)
Transition of Care (TOC) - CM/SW Discharge Note ? ? ?Patient Details  ?Name: Samantha Snyder ?MRN: [id number] ?Date of Birth: May 18, 1946 ? ?Transition of Care (TOC) CM/SW Contact:  ?Molleigh Huot, Marta Lamas, LCSW ?Phone Number: ?10/06/2021, 1:07 PM ? ? ?Clinical Narrative:    ? ?CSW received call from [place], bedside RN for patient, indicating that patient is being discharged home today (04/15) and will need outpatient rehabilitative services arranged.  CSW spoke with patient, who reported that Attending Physician, Dr. Vance Gather would like for her to receive outpatient rehabilitative services at [facility], located at [address], [place], [place] [address].  CSW provided patient with contact information (# [phone]), as well as left HIPAA compliant message on voicemail for [facility].  CSW encouraged patient to follow-up on 04/17, when office reopens, to arrange outpatient rehabilitation schedule.  Patient voiced understanding and was agreeable to this plan. ? ?Patient Goals and CMS Choice ?  ?Home ? ?Discharge Placement ?  ?N/A ? ?Discharge Plan and Services ?  ?Home with Self Care, [place] [facility], located at [address], [place], [place] [address] [phone])  ? ? ?Social Determinants of Health (SDOH) Interventions ?  ?N/A ? ?Readmission Risk Interventions ?   ? View : No data to display.  ?  ?  ?  ? ? ? ? ? ?

## 2021-10-06 NOTE — Plan of Care (Signed)

## 2021-10-07 ENCOUNTER — Encounter: Payer: Self-pay | Admitting: Family Medicine

## 2021-10-07 ENCOUNTER — Encounter: Payer: Self-pay | Admitting: Family

## 2021-10-07 NOTE — Progress Notes (Signed)
CT positive for a blood clot and she was sent to the ER . Please see if you can reach Samantha Snyder or her husband and see how she is doing, thx

## 2021-10-08 ENCOUNTER — Telehealth: Payer: Self-pay | Admitting: Family Medicine

## 2021-10-08 LAB — GLUCOSE, CAPILLARY
Glucose-Capillary: 125 mg/dL — ABNORMAL HIGH (ref 70–99)
Glucose-Capillary: 122 mg/dL — ABNORMAL HIGH (ref 70–99)

## 2021-10-08 NOTE — Telephone Encounter (Signed)
I Called and spoke with patient. She has a appointment with Dr. Yong Channel on 10/09/2021 at 4pm. ?

## 2021-10-08 NOTE — Telephone Encounter (Signed)
?  Patient ?Name: ?Samantha Snyder ?CK ?Gender: Female ?DOB: 02-02-1946 ?Age: 76 Y 1 M 29 D ?Return ?Phone ?Number: ?[id number] ?(Primary) ?Address: ?City/ ?State/ ?Zip: ?[place] ? [address] ?Client [facility] [facility] - ?Clie ?Presenter, broadcasting at [place] [employer] ?Contact Type Call ?Who Is Calling Lab / Radiology ?Lab Name [facility] Radiology ?Lab Phone Number [phone] ?Lab Tech Name Erline Levine ?Chief Complaint Lab Result (Critical or Stat) ?Call Type Lab Send to RN ?Reason for Call Report lab results ?Initial Comment Caller states she is calling to report critical results. ?Dr. Felipe Drone is the doctor, not listed, address ?confirmed. ?Translation No ?Nurse Assessment ?Nurse: Volanda Napoleon, RN, Wells Guiles Date/Time Eilene Ghazi Time): 10/05/2021 5:41:25 PM ?Confirm and document reason for call. If ?symptomatic, describe symptoms. ?---Caller is radiology department to report critical CT ?Head results. ?Does the patient have any new or worsening ?symptoms? ---No ?Please document clinical information provided and ?list any resource used. ?---Acute right sided subdural hematoma without mass ?effect of midline shift. [facility]. CT tech has ?already contacted patient to send her to ED. ?Disp. Time (Eastern ?Time) Disposition Final User ?10/05/2021 5:45:22 PM Paged On Call back to Call Jennings, RN, Wells Guiles ?10/05/2021 6:33:48 PM Called On-Call Provider Volanda Napoleon, RN, Wells Guiles ?10/05/2021 6:34:37 PM Lab Call Volanda Napoleon, RN, Wells Guiles ?Reason: received critical rad results for ?patient ?10/05/2021 6:34:46 PM Clinical Call Yes Volanda Napoleon, RN, Wells Guiles ?Paging ?DoctorName Phone DateTime Result/ ?Outcome Message Type Notes ?[place], ?Kendrick Fries- MD [phone] ?10/05/2021 ?5:45:22 ?PM ?Paged On Call ?Back to Call ?Center ?Doctor Paged ?THis is Wells Guiles, Therapist, sports at ?accessnurse needed to report ?critical results about a patient. ?Please call back at [phone] ?thank you ?[place], ?Kendrick Fries- MD  [phone] ?10/05/2021 ?6:33:48 ?PM ?Called On ?Call Provider - ?Reached ?Doctor Paged ?[place]ne-Chase, ?Kendrick Fries- MD ?10/05/2021 ?6:34:02 ?PM ?Spoke with On ?Call - General Message Result results reported to [facility] provider ?

## 2021-10-09 ENCOUNTER — Ambulatory Visit: Payer: Medicare PPO | Admitting: Family Medicine

## 2021-10-09 ENCOUNTER — Encounter: Payer: Self-pay | Admitting: Family Medicine

## 2021-10-09 VITALS — BP 119/76 | HR 82 | Temp 98.4°F | Ht 69.0 in | Wt 191.0 lb

## 2021-10-09 DIAGNOSIS — E1142 Type 2 diabetes mellitus with diabetic polyneuropathy: Secondary | ICD-10-CM | POA: Diagnosis not present

## 2021-10-09 DIAGNOSIS — I10 Essential (primary) hypertension: Secondary | ICD-10-CM | POA: Diagnosis not present

## 2021-10-09 DIAGNOSIS — S065XAA Traumatic subdural hemorrhage with loss of consciousness status unknown, initial encounter: Secondary | ICD-10-CM

## 2021-10-09 DIAGNOSIS — E785 Hyperlipidemia, unspecified: Secondary | ICD-10-CM

## 2021-10-09 DIAGNOSIS — I700 Atherosclerosis of aorta: Secondary | ICD-10-CM

## 2021-10-09 DIAGNOSIS — J439 Emphysema, unspecified: Secondary | ICD-10-CM

## 2021-10-09 DIAGNOSIS — E1169 Type 2 diabetes mellitus with other specified complication: Secondary | ICD-10-CM | POA: Diagnosis not present

## 2021-10-09 NOTE — Patient Instructions (Addendum)
Recommended follow up: consider follow up in a month or keep august otherwise- suspect slow but steady improvement- if worsening please let me know ? ?As always quitting smoking is one of the best things you can do for your health ?

## 2021-10-09 NOTE — Progress Notes (Signed)
?Phone [phone] ?In person visit ?  ?Subjective:  ? ?Samantha Snyder is a 76 y.o. year old very pleasant female patient who presents for/with See problem oriented charting ?Chief Complaint  ?Patient presents with  ? Follow-up  ?  Fall on 4/11, ED on 4/14-4/15 and they found a subdermal hematoma in her head. She is still having headaches and taking Tylenol 107m every 6 hours.   ? ? ?This visit occurred during the SARS-CoV-2 public health emergency.  Safety protocols were in place, including screening questions prior to the visit, additional usage of staff PPE, and extensive cleaning of exam room while observing appropriate contact time as indicated for disinfecting solutions.  ? ?Past Medical History-  ?Patient Active Problem List  ? Diagnosis Date Noted  ? Subdural hematoma ([facility] 10/05/2021  ?  Priority: High  ? DM (diabetes mellitus) type II controlled, neurological manifestation ([facility] [place] 02/24/2014  ?  Priority: High  ? Tobacco abuse 02/24/2014  ?  Priority: High  ? [place]ortic atherosclerosis ([place] 07/08/2017  ?  Priority: Medium   ? Osteoporosis 03/30/2014  ?  Priority: Medium   ? Essential hypertension 03/08/2014  ?  Priority: Medium   ? Hyperlipidemia associated with type 2 diabetes mellitus ([facility] 02/24/2014  ?  Priority: Medium   ? Em[place]se[place] lung ([place] 12/19/2020  ?  Priority: Low  ? Diabetic peripheral neuropathy ([place] 10/27/2014  ?  Priority: Low  ? Seasonal allergies 02/24/2014  ?  Priority: Low  ? ? ?Medications- reviewed and updated ?Current Outpatient Medications  ?Medication Sig Dispense Refill  ? Alcohol Swabs PADS Use to clean area before checking blood sugars. 100 each 3  ? alendronate (FOSAMAX) 70 MG tablet TAKE 1 TABLET BY MOUTH EVERY 7 DAYS. TAKE WITH A FULL GLASS OF WATER ON AN EMPTY STOMACH. (Patient taking differently: Take 70 mg by mouth every Sunday.) 12 tablet 3  ? Alpha-Lipoic Acid 600 MG CAPS TAKE 1 CAPSULE (600 MG TOTAL) BY MOUTH DAILY. (Patient taking differently: Take 600 mg by mouth  every other day.) 60 capsule 5  ? atorvastatin (LIPITOR) 20 MG tablet Take 1 tablet (20 mg total) by mouth daily. 90 tablet 3  ? Blood Glucose Monitoring Suppl (TRUE METRIX METER) w/Device KIT Use to test blood sugars daily. Dx: E11.9 1 kit 3  ? Calcium Carbonate (CALCIUM 600 PO) Take 1,200 mg by mouth at bedtime.    ? Cholecalciferol (VITAMIN D3) 2000 UNITS TABS Take 2,000 Units by mouth at bedtime.    ? diclofenac Sodium (VOLTAREN) 1 % GEL Apply 2 g topically 4 (four) times daily as needed (to painful sites of feet and hands).    ? fexofenadine (ALLEGRA) 180 MG tablet Take 180 mg by mouth daily.    ? glucose blood (TRUE METRIX BLOOD GLUCOSE TEST) test strip TEST BLOOD SUGAR  UP  TO FOUR TIMES DAILY 350 strip 0  ? lisinopril (ZESTRIL) 5 MG tablet Take 1 tablet (5 mg total) by mouth daily. 90 tablet 3  ? Magnesium 500 MG CAPS Take 500 mg by mouth at bedtime.    ? metFORMIN (GLUCOPHAGE) 1000 MG tablet TAKE 1 TABLET TWICE DAILY WITH A MEAL (Patient taking differently: Take 1,000 mg by mouth 2 (two) times daily after a meal.) 180 tablet 3  ? Multiple Vitamins-Minerals (HAIR SKIN AND NAILS FORMULA PO) Take 2 tablets by mouth daily.    ? Multiple Vitamins-Minerals (MULTIVITAMIN PO) Take 1 tablet by mouth at bedtime.    ? Omega-3 Fatty Acids (FISH OIL  PO) Take 2,000 mg by mouth in the morning and at bedtime.    ? OVER THE COUNTER MEDICATION Take 240 mLs by mouth See admin instructions. Herbal tea- Drink 8 ounces by mouth daily    ? OZEMPIC, 0.25 OR 0.5 MG/DOSE, 2 MG/3ML SOPN INJECT 0.5MG AS DIRECTED ONCE WEEKLY (Patient taking differently: Inject 0.5 mg into the skin every Wednesday.) 3 mL 5  ? Probiotic Product (PROBIOTIC DAILY PO) Take 1 capsule by mouth every evening.    ? TRUEplus Lancets 33G MISC TEST BLOOD SUGAR  UP  TO FOUR TIMES DAILY 400 each 0  ? ?No current facility-administered medications for this visit.  ? ?  ?Objective:  ?BP 119/76 (BP Location: Left Arm, Patient Position: Sitting, Cuff Size: Large)    Pulse 82   Temp 98.4 ?F (36.9 ?C) (Temporal)   Ht 5' 9"  (1.753 m)   Wt 191 lb (86.6 kg)   LMP  (LMP Unknown)   SpO2 97%   BMI 28.21 kg/m?  ?Gen: NAD, resting comfortably ?CV: RRR no murmurs rubs or gallops ?Lungs: CTAB no crackles, wheeze, rhonchi ?Ext: trace edema ?Skin: warm, dry ? Neuro: CN II-XII intact, sensation and reflexes normal throughout, 5/5 muscle strength in bilateral upper and lower extremities. Normal finger to nose. Normal gait with walker.   ?  ? ?Assessment and Plan  ?# hospital follow up for Subdural hematoma ?S:patient originally seen ED 10/03/21 after per note  "walking down a ramp at home yesterday evening and fell at the bottom.  She landed on her knees in the grass.  She did not hit her head denies any injuries elsewhere.  She just wants to be checked out.  She is not having any pain.  Her husband was concerned because she seemed a little dazed after the fall but this was brief and transient. " She reports she said that she was not sure if she hit her head. Husband reports had memory issues about 40 minutes later before going into ER. She states she was having mild headache at time of ER and then pain under right arm- didn't really remember full details of the fall. She believes she hit the ledge of ramp- does remember falling forward and not being able to stop and then calling out afterwards for husband- think may have hit head on tree. Also possible passed out after the fall. She did take ibuprofen for headache initially. Sugar was not low when this happened ? ?When patient was seen 10/05/21 by Cherylann Parr, NP reported headaches and that she did think she hit her head at that point- Ct scan was ordered with worsening headache pattern and SD[facility] noted "Acute right-sided subdural hematoma without mass effect or ?midline shift." She was taken immediately to ER to be admitted- Repeat CT next day showed stability ?-she knows to avoid nsaids. Doing tylenol  ?-considered PT ?-will remain  off aspirin and ibuprofen and aleve ?- had some memory issues at first but improving regularly at this point ?-no shortness of breath. Does have some right back rib pain- could have mild fracture. Also some pain under right arm ?-headache up to 5/10 at worst ?A/P: Patient with subdural hematoma and likely concussion as well-with gradually improving mental clarity and ongoing headaches.  I am thankful she was taken off of aspirin and advised to avoid NSAIDs-she will remain off of these-we discussed appropriate usage of Tylenol extra strength versus arthritis in max doses per day with #6 of extra strength 500 mg as  well as #6 of Tylenol arthritis 650 mg as options ?- Does have some right-sided rib pain-possible fracture versus contusion-she wants to hold off on x-rays at this time as likely would not change management ?- It sounds like this was a mechanical fall exacerbated by her neuropathy and difficulty placing her feet as she fell-she remembers falling so no evidence of syncope but she did have loss of consciousness after the fall for a period. ?- We discussed return precautions ?-Labs were stable in hospital we did not think repeat necessary at this time ? ?In regards to fall prevention ?- will build handrail to give more support on that ramp- prior had been removed duet to rotting ?-walker for now- consider graduating to cane long-term with neuropathy issues ? ? ? ?#hypertension ?S: medication: Lisinopril 5Mg ?A/P:  Controlled. Continue current medications.   ? ?#hyperlipidemia ?#Aortic atherosclerosis-LDL goal under 70.  Incidental finding on imaging ?S: Medication: Atorvastatin 20Mg ?Lab Results  ?Component Value Date  ? CHOL 127 03/30/2021  ? HDL 43.90 03/30/2021  ? LDLCALC 47 03/30/2021  ? LDLDIRECT 73.0 03/11/2017  ? TRIG 181.0 (H) 03/30/2021  ? CHOLHDL 3 03/30/2021  ?  ? A/P: Lipids have been well controlled-continue current medication.  Aortic atherosclerosis at goal for LDL 70 or less. ?- She agrees to  remain off aspirin long-term for primary prevention ? ?# Diabetes-A1c typically under 7 ?#Diabetic neuropathy-alpha lipoic acid generally helpful, also on voltaren gel .  Requires diabetic shoes/insoles-  ?S: Medi

## 2021-10-10 NOTE — Telephone Encounter (Signed)
Patient was seen on 10/09/21 by Dr. Yong Channel.  ?

## 2021-10-15 ENCOUNTER — Encounter: Payer: Self-pay | Admitting: Family Medicine

## 2021-10-15 ENCOUNTER — Ambulatory Visit: Payer: Medicare PPO | Admitting: Family Medicine

## 2021-10-15 VITALS — BP 108/60 | HR 94 | Temp 98.1°F | Ht 69.0 in | Wt 189.2 lb

## 2021-10-15 DIAGNOSIS — E1169 Type 2 diabetes mellitus with other specified complication: Secondary | ICD-10-CM | POA: Diagnosis not present

## 2021-10-15 DIAGNOSIS — I69093 Ataxia following nontraumatic subarachnoid hemorrhage: Secondary | ICD-10-CM

## 2021-10-15 DIAGNOSIS — S065XAA Traumatic subdural hemorrhage with loss of consciousness status unknown, initial encounter: Secondary | ICD-10-CM

## 2021-10-15 DIAGNOSIS — R270 Ataxia, unspecified: Secondary | ICD-10-CM

## 2021-10-15 DIAGNOSIS — E785 Hyperlipidemia, unspecified: Secondary | ICD-10-CM

## 2021-10-15 DIAGNOSIS — I10 Essential (primary) hypertension: Secondary | ICD-10-CM | POA: Diagnosis not present

## 2021-10-15 DIAGNOSIS — S0990XA Unspecified injury of head, initial encounter: Secondary | ICD-10-CM | POA: Diagnosis not present

## 2021-10-15 DIAGNOSIS — I700 Atherosclerosis of aorta: Secondary | ICD-10-CM | POA: Diagnosis not present

## 2021-10-15 DIAGNOSIS — E1142 Type 2 diabetes mellitus with diabetic polyneuropathy: Secondary | ICD-10-CM | POA: Diagnosis not present

## 2021-10-15 NOTE — Patient Instructions (Addendum)
blood pressure possibly over controlled and could contribute to less confidence standing- hold lisinopril at home unless blood pressure >135/85 ? ?Team set her up for stat MRI ? ?We will call you within two weeks about your referral to home health physical therapy and occupational therapy. If you do not hear within 2 weeks, give [place] a call.  ? ? ?Recommended follow up: Return for as needed for new, worsening, persistent symptoms. ?

## 2021-10-15 NOTE — Progress Notes (Signed)
?Phone [phone] ?In person visit ?  ?Subjective:  ? ?Samantha Snyder is a 76 y.o. year old very pleasant female patient who presents for/with See problem oriented charting ?Chief Complaint  ?Patient presents with  ? Follow-up  ?  Pt here to f/u on "stumbling", yesterday pt was leaning against the shelf while standing and could not move yesterday and couldn't get legs stabilized under her and it happened again today.  ? ? ?This visit occurred during the SARS-CoV-2 public health emergency.  Safety protocols were in place, including screening questions prior to the visit, additional usage of staff PPE, and extensive cleaning of exam room while observing appropriate contact time as indicated for disinfecting solutions.  ? ?Past Medical History-  ?Patient Active Problem List  ? Diagnosis Date Noted  ? Subdural hematoma ([place]) 10/05/2021  ?  Priority: High  ? DM (diabetes mellitus) type II controlled, neurological manifestation ([place]) 02/24/2014  ?  Priority: High  ? Tobacco abuse 02/24/2014  ?  Priority: High  ? Aortic atherosclerosis ([place]) 07/08/2017  ?  Priority: Medium   ? Osteoporosis 03/30/2014  ?  Priority: Medium   ? Essential hypertension 03/08/2014  ?  Priority: Medium   ? Hyperlipidemia associated with type 2 diabetes mellitus ([place]) 02/24/2014  ?  Priority: Medium   ? Emphysema lung ([place]) 12/19/2020  ?  Priority: Low  ? Diabetic peripheral neuropathy ([place]) 10/27/2014  ?  Priority: Low  ? Seasonal allergies 02/24/2014  ?  Priority: Low  ? ? ?Medications- reviewed and updated ?Current Outpatient Medications  ?Medication Sig Dispense Refill  ? Alcohol Swabs PADS Use to clean area before checking blood sugars. 100 each 3  ? alendronate (FOSAMAX) 70 MG tablet TAKE 1 TABLET BY MOUTH EVERY 7 DAYS. TAKE WITH A FULL GLASS OF WATER ON AN EMPTY STOMACH. (Patient taking differently: Take 70 mg by mouth every Sunday.) 12 tablet 3  ? Alpha-Lipoic Acid 600 MG CAPS TAKE 1 CAPSULE (600 MG TOTAL) BY MOUTH DAILY. (Patient  taking differently: Take 600 mg by mouth every other day.) 60 capsule 5  ? atorvastatin (LIPITOR) 20 MG tablet Take 1 tablet (20 mg total) by mouth daily. 90 tablet 3  ? Blood Glucose Monitoring Suppl (TRUE METRIX METER) w/Device KIT Use to test blood sugars daily. Dx: E11.9 1 kit 3  ? Calcium Carbonate (CALCIUM 600 PO) Take 1,200 mg by mouth at bedtime.    ? Cholecalciferol (VITAMIN D3) 2000 UNITS TABS Take 2,000 Units by mouth at bedtime.    ? diclofenac Sodium (VOLTAREN) 1 % GEL Apply 2 g topically 4 (four) times daily as needed (to painful sites of feet and hands).    ? fexofenadine (ALLEGRA) 180 MG tablet Take 180 mg by mouth daily.    ? glucose blood (TRUE METRIX BLOOD GLUCOSE TEST) test strip TEST BLOOD SUGAR  UP  TO FOUR TIMES DAILY 350 strip 0  ? lisinopril (ZESTRIL) 5 MG tablet Take 1 tablet (5 mg total) by mouth daily. 90 tablet 3  ? Magnesium 500 MG CAPS Take 500 mg by mouth at bedtime.    ? metFORMIN (GLUCOPHAGE) 1000 MG tablet TAKE 1 TABLET TWICE DAILY WITH A MEAL (Patient taking differently: Take 1,000 mg by mouth 2 (two) times daily after a meal.) 180 tablet 3  ? Multiple Vitamins-Minerals (HAIR SKIN AND NAILS FORMULA PO) Take 2 tablets by mouth daily.    ? Multiple Vitamins-Minerals (MULTIVITAMIN PO) Take 1 tablet by mouth at bedtime.    ? Omega-3  Fatty Acids (FISH OIL PO) Take 2,000 mg by mouth in the morning and at bedtime.    ? OVER THE COUNTER MEDICATION Take 240 mLs by mouth See admin instructions. Herbal tea- Drink 8 ounces by mouth daily    ? OZEMPIC, 0.25 OR 0.5 MG/DOSE, 2 MG/3ML SOPN INJECT 0.5MG AS DIRECTED ONCE WEEKLY (Patient taking differently: Inject 0.5 mg into the skin every Wednesday.) 3 mL 5  ? Probiotic Product (PROBIOTIC DAILY PO) Take 1 capsule by mouth every evening.    ? TRUEplus Lancets 33G MISC TEST BLOOD SUGAR  UP  TO FOUR TIMES DAILY 400 each 0  ? ?No current facility-administered medications for this visit.  ? ?  ?Objective:  ?BP 108/60   Pulse 94   Temp 98.1 ?F (36.7  ?C)   Ht 5' 9"  (1.753 m)   Wt 189 lb 3.2 oz (85.8 kg)   LMP  (LMP Unknown)   SpO2 97%   BMI 27.94 kg/m?  ?Gen: NAD, resting comfortably ?CV: RRR no murmurs rubs or gallops ?Lungs: CTAB no crackles, wheeze, rhonchi ?Ext: no edema ?Skin: warm, dry ?Neuro: CN II-XII intact, sensation and reflexes normal throughout, 5/5 muscle strength in bilateral upper and lower extremities. Normal finger to nose. Normal rapid alternating movements. No pronator drift. Normal romberg. Staggering gait - takes very small steps- feels imbalanced. Walks with walker with more confidence ? ?  ? ?Assessment and Plan  ? ?#Subdural hematoma with ataxia ?S: Patient with subdural hematoma and likely concussion last seen on 10/09/2021.  Had been seen in the emergency room on 10/05/2021 after outpatient CT showed subdural and was admitted-stable CT scan over the next 24 hours.  Patient had ongoing headaches 5 out of 10 at last visit-aspirin had already been removed. ?- We thought this was a mechanical fall exacerbated by her neuropathy and difficulty placing her feet as she fell-there was no evidence of syncope ?- Plan was to build a guardrail/femoral to give more support on the ramp-prior 1 have been removed due to rotting ?- Was advised to use a walker at all times  ? ?Husband reports she feels weaker overall. Has periods where she cannot recall a word or has somewhat delayed response to something. Last night was leaning up against the wall and called her husband to assist- she stated walker was not working- she simply could not get stabilized and he had to sit her down on the bed - heavily supported her getting to bed. Had a similar episode this morning. Noted a flash of light earlier today that lasted 15 minutes then went away.  ?- Headaches can get up to 6/10 (pretty similar to 5/10 last visit) and most of the day more 3/10. She is tired of hurting- headaches are not worse.  ?A/P: Patient with subdural hematoma noted 10/05/2021 who also  likely has a concussion with issues with ataxia/stumbling since her subdural was discovered-in retrospect I wonder if even her initial event of falling which led to the subdural could have been from ataxia-has already had 2 CTs and no stroke was noted and we did strongly consider repeating in case subdural was worsening though I do not strongly suspect-we thought would be better to rule out stroke with MRI of the brain-would likely need neurology input if there was in fact a stroke to allow [place] to balance stroke with subdural.  Could also be that neuropathy has decreased her confidence along with prior fall but she does not seem nearly herself walking today ?-  will likely check b12 with next labs ?- wears apple watch but not sure if reads ekg- may be older model-would consider cardiac monitoring if concern for embolic stroke ?- Obviously needs to quit smoking-this has been challenging for her ?- Discussed if did have stroke likely get carotid ultrasound, potential echocardiogram as well-plan she is already on statin as below ?  ?#hypertension ?S: medication: Lisinopril 5Mg ?A/P: blood pressure possibly over controlled and could contribute to less confidence standing- hold lisinopril at home unless blood pressure >135/85 ? ?#hyperlipidemia ?#Aortic atherosclerosis-LDL goal under 70.  Incidental finding on imaging ?S: Medication: Atorvastatin 20Mg ?-Prior on aspirin 81 mg for primary prevention-stopped after subdural hematoma April 2023 ?Lab Results  ?Component Value Date  ? CHOL 127 03/30/2021  ? HDL 43.90 03/30/2021  ? LDLCALC 47 03/30/2021  ? LDLDIRECT 73.0 03/11/2017  ? TRIG 181.0 (H) 03/30/2021  ? CHOLHDL 3 03/30/2021  ? A/P: increased risk of stroke given atherosclerosis risk factor. LDL under goal even if she has had a stroke- most concerned about cerebe[place]um. Continue current meds for now and LDL goal under 70 ? ?# Diabetes-A1c typically under 7 ?#Diabetic neuropathy-alpha lipoic acid generally helpful, also on  voltaren gel ?S: Medication: Metformin 1000Mg twice daily, Ozempic 0.5 mg daily ?Lab Results  ?Component Value Date  ? HGBA1C 6.9 (H) 08/03/2021  ?A/P: reasonable control- continue current meds ? ?Recommended foll

## 2021-10-16 ENCOUNTER — Encounter (HOSPITAL_COMMUNITY): Payer: Self-pay | Admitting: *Deleted

## 2021-10-16 ENCOUNTER — Emergency Department (HOSPITAL_COMMUNITY)
Admission: EM | Admit: 2021-10-16 | Discharge: 2021-10-16 | Disposition: A | Discharge status: Home / Self Care | Payer: Medicare PPO | Attending: Emergency Medicine | Admitting: Emergency Medicine

## 2021-10-16 ENCOUNTER — Other Ambulatory Visit: Payer: Self-pay

## 2021-10-16 ENCOUNTER — Encounter: Payer: Self-pay | Admitting: Family Medicine

## 2021-10-16 ENCOUNTER — Emergency Department (HOSPITAL_COMMUNITY): Payer: Medicare PPO

## 2021-10-16 DIAGNOSIS — S0990XD Unspecified injury of head, subsequent encounter: Secondary | ICD-10-CM | POA: Diagnosis present

## 2021-10-16 DIAGNOSIS — I6200 Nontraumatic subdural hemorrhage, unspecified: Secondary | ICD-10-CM | POA: Diagnosis not present

## 2021-10-16 DIAGNOSIS — W01198D Fall on same level from slipping, tripping and stumbling with subsequent striking against other object, subsequent encounter: Secondary | ICD-10-CM | POA: Diagnosis not present

## 2021-10-16 DIAGNOSIS — S065XAA Traumatic subdural hemorrhage with loss of consciousness status unknown, initial encounter: Secondary | ICD-10-CM

## 2021-10-16 DIAGNOSIS — S065X0A Traumatic subdural hemorrhage without loss of consciousness, initial encounter: Secondary | ICD-10-CM | POA: Diagnosis not present

## 2021-10-16 DIAGNOSIS — N390 Urinary tract infection, site not specified: Secondary | ICD-10-CM | POA: Diagnosis not present

## 2021-10-16 DIAGNOSIS — S065X0D Traumatic subdural hemorrhage without loss of consciousness, subsequent encounter: Secondary | ICD-10-CM | POA: Diagnosis not present

## 2021-10-16 DIAGNOSIS — B9689 Other specified bacterial agents as the cause of diseases classified elsewhere: Secondary | ICD-10-CM | POA: Insufficient documentation

## 2021-10-16 DIAGNOSIS — I609 Nontraumatic subarachnoid hemorrhage, unspecified: Secondary | ICD-10-CM | POA: Diagnosis not present

## 2021-10-16 LAB — CBC
HCT: 46.1 % — ABNORMAL HIGH (ref 36.0–46.0)
Hemoglobin: 15.2 g/dL — ABNORMAL HIGH (ref 12.0–15.0)
MCH: 30.7 pg (ref 26.0–34.0)
MCHC: 33 g/dL (ref 30.0–36.0)
MCV: 93.1 fL (ref 80.0–100.0)
Platelets: 449 10*3/uL — ABNORMAL HIGH (ref 150–400)
RBC: 4.95 MIL/uL (ref 3.87–5.11)
RDW: 14.8 % (ref 11.5–15.5)
WBC: 12.4 10*3/uL — ABNORMAL HIGH (ref 4.0–10.5)
nRBC: 0 % (ref 0.0–0.2)

## 2021-10-16 LAB — URINALYSIS, ROUTINE W REFLEX MICROSCOPIC
Bilirubin Urine: NEGATIVE
Glucose, UA: NEGATIVE mg/dL
Hgb urine dipstick: NEGATIVE
Ketones, ur: NEGATIVE mg/dL
Nitrite: POSITIVE — AB
Protein, ur: NEGATIVE mg/dL
Specific Gravity, Urine: 1.012 (ref 1.005–1.030)
pH: 5 (ref 5.0–8.0)

## 2021-10-16 LAB — BASIC METABOLIC PANEL
Anion gap: 11 (ref 5–15)
BUN: 17 mg/dL (ref 8–23)
CO2: 19 mmol/L — ABNORMAL LOW (ref 22–32)
Calcium: 9.4 mg/dL (ref 8.9–10.3)
Chloride: 110 mmol/L (ref 98–111)
Creatinine, Ser: 0.59 mg/dL (ref 0.44–1.00)
GFR, Estimated: >60 mL/min (ref >60)
Glucose, Bld: 114 mg/dL — ABNORMAL HIGH (ref 70–99)
Potassium: 4.4 mmol/L (ref 3.5–5.1)
Sodium: 140 mmol/L (ref 135–145)

## 2021-10-16 MED ORDER — ACETAMINOPHEN 325 MG PO TABS
650.0000 mg | ORAL_TABLET | Freq: Once | ORAL | Status: AC
  Administered 2021-10-16: 650 mg via ORAL
  Filled 2021-10-16: qty 2

## 2021-10-16 MED ORDER — NITROFURANTOIN MONOHYD MACRO 100 MG PO CAPS: 100.0000 mg | ORAL_CAPSULE | Freq: Two times a day (BID) | ORAL | 0 refills | Status: DC

## 2021-10-16 NOTE — ED Provider Notes (Signed)
?[facility] DEPT ?Provider Note ? ? ?CSN: [id number] ?Arrival date & time: 10/16/21  1015 ? ?  ? ?History ? ?Chief Complaint  ?Patient presents with  ? MRI sent by PCP  ? ? ?Samantha Snyder is a 76 y.o. female.  Presenting to ER due to concern for MRI.  Patient reports that she had a fall and was diagnosed with a subdural hematoma.  Per my review of chart this occurred on 4/14.  She was ultimately discharged home.  Ever since discharge she has continued to deal with headaches, seem to be well controlled with Tylenol.  Has been taking Tylenol on basically a daily basis.  Currently headache is moderate.  Frontal.  Has had some issues with balance and walking but has been able to walk independently with use of a walker.  Followed up with her PCP who advised her to go to the ER for MRI.  To rule out stroke.  Completed chart review, reviewed recent discharge summary from hospital admission on 4/14 as well as PCP note from Dr. Yong Channel from yesterday.  He was concerned about ataxia from possible.  ? ?She denies any dysuria or hematuria, abdominal pain or fevers or chills.  Overall feels like she is improving but it is going very slowly. ? ?HPI ? ?  ? ?Home Medications ?Prior to Admission medications   ?Medication Sig Start Date End Date Taking? Authorizing Provider  ?nitrofurantoin, macrocrystal-monohydrate, (MACROBID) 100 MG capsule Take 1 capsule (100 mg total) by mouth 2 (two) times daily. 10/16/21  Yes Lucrezia Starch, MD  ?Alcohol Swabs PADS Use to clean area before checking blood sugars. 04/09/21   Marin Olp, MD  ?alendronate (FOSAMAX) 70 MG tablet TAKE 1 TABLET BY MOUTH EVERY 7 DAYS. TAKE WITH A FULL GLASS OF WATER ON AN EMPTY STOMACH. ?Patient taking differently: Take 70 mg by mouth every Sunday. 09/24/21   Marin Olp, MD  ?Alpha-Lipoic Acid 600 MG CAPS TAKE 1 CAPSULE (600 MG TOTAL) BY MOUTH DAILY. ?Patient taking differently: Take 600 mg by mouth every other day.  11/08/20   Marin Olp, MD  ?atorvastatin (LIPITOR) 20 MG tablet Take 1 tablet (20 mg total) by mouth daily. 04/09/21   Marin Olp, MD  ?Blood Glucose Monitoring Suppl (TRUE METRIX METER) w/Device KIT Use to test blood sugars daily. Dx: E11.9 04/09/21   Marin Olp, MD  ?Calcium Carbonate (CALCIUM 600 PO) Take 1,200 mg by mouth at bedtime.    [provider]  ?Cholecalciferol (VITAMIN D3) 2000 UNITS TABS Take 2,000 Units by mouth at bedtime.    [provider]  ?diclofenac Sodium (VOLTAREN) 1 % GEL Apply 2 g topically 4 (four) times daily as needed (to painful sites of feet and hands).    [provider]  ?fexofenadine (ALLEGRA) 180 MG tablet Take 180 mg by mouth daily.    [provider]  ?glucose blood (TRUE METRIX BLOOD GLUCOSE TEST) test strip TEST BLOOD SUGAR  UP  TO FOUR TIMES DAILY 06/19/21   Marin Olp, MD  ?lisinopril (ZESTRIL) 5 MG tablet Take 1 tablet (5 mg total) by mouth daily. 04/09/21   Marin Olp, MD  ?Magnesium 500 MG CAPS Take 500 mg by mouth at bedtime.    [provider]  ?metFORMIN (GLUCOPHAGE) 1000 MG tablet TAKE 1 TABLET TWICE DAILY WITH A MEAL ?Patient taking differently: Take 1,000 mg by mouth 2 (two) times daily after a meal. 04/09/21   Hunter,  [name], MD  ?Multiple Vitamins-Minerals (HAIR SKIN AND NAILS FORMULA PO) Take 2 tablets by mouth daily.    [provider]  ?Multiple Vitamins-Minerals (MULTIVITAMIN PO) Take 1 tablet by mouth at bedtime.    [provider]  ?Omega-3 Fatty Acids (FISH OIL PO) Take 2,000 mg by mouth in the morning and at bedtime.    [provider]  ?OVER THE COUNTER MEDICATION Take 240 mLs by mouth See admin instructions. Herbal tea- Drink 8 ounces by mouth daily    [provider]  ?OZEMPIC, 0.25 OR 0.5 MG/DOSE, 2 MG/3ML SOPN INJECT 0.5MG AS DIRECTED ONCE WEEKLY ?Patient taking differently: Inject 0.5 mg into the skin every Wednesday. 10/04/21   Marin Olp, MD  ?Probiotic Product (PROBIOTIC DAILY PO) Take 1 capsule by mouth every evening.    [provider]  ?TRUEplus Lancets 33G MISC TEST BLOOD SUGAR  UP  TO FOUR TIMES DAILY 06/19/21   Marin Olp, MD  ?   ? ?Allergies    ?Fluoride preparations   ? ?Review of Systems   ?Review of Systems  ?Constitutional:  Positive for fatigue. Negative for chills and fever.  ?HENT:  Negative for ear pain and sore throat.   ?Eyes:  Negative for pain and visual disturbance.  ?Respiratory:  Negative for cough and shortness of breath.   ?Cardiovascular:  Negative for chest pain and palpitations.  ?Gastrointestinal:  Negative for abdominal pain and vomiting.  ?Genitourinary:  Negative for dysuria and hematuria.  ?Musculoskeletal:  Negative for arthralgias and back pain.  ?Skin:  Negative for color change and rash.  ?Neurological:  Positive for headaches. Negative for dizziness, seizures and syncope.  ?All other systems reviewed and are negative. ? ?Physical Exam ?Updated Vital Signs ?BP 125/75   Pulse 75   Temp 97.8 ?F (36.6 ?C) (Oral)   Resp 14   Ht 5' 9"  (1.753 m)   Wt 87.1 kg   LMP  (LMP Unknown)   SpO2 95%   BMI 28.35 kg/m?  ?Physical Exam ?Vitals and nursing note reviewed.  ?Constitutional:   ?   General: She is not in acute distress. ?   Appearance: She is well-developed.  ?HENT:  ?   Head: Normocephalic and atraumatic.  ?Eyes:  ?   Conjunctiva/sclera: Conjunctivae normal.  ?Cardiovascular:  ?   Rate and Rhythm: Normal rate and regular rhythm.  ?   Heart sounds: No murmur heard. ?Pulmonary:  ?   Effort: Pulmonary effort is normal. No respiratory distress.  ?   Breath sounds: Normal breath sounds.  ?Abdominal:  ?   Palpations: Abdomen is soft.  ?   Tenderness: There is no abdominal tenderness.  ?Musculoskeletal:     ?   General: No swelling.  ?   Cervical back: Neck supple.  ?Skin: ?   General: Skin is warm and dry.  ?   Capillary Refill: Capillary refill takes less than 2 seconds.  ?Neurological:  ?    Mental Status: She is alert.  ?   Comments: Alert and oriented, answering all questions appropriately, no focal neurodeficit noted  ?Psychiatric:     ?   Mood and Affect: Mood normal.  ? ? ?ED Results / Procedures / Treatments   ?Labs ?(all labs ordered are listed, but only abnormal results are displayed) ?Labs Reviewed  ?CBC - Abnormal; Notable for the following components:  ?    Result Value  ? WBC 12.4 (*)   ? Hemoglobin 15.2 (*)   ?  HCT 46.1 (*)   ? Platelets 449 (*)   ? All other components within normal limits  ?BASIC METABOLIC PANEL - Abnormal; Notable for the following components:  ? CO2 19 (*)   ? Glucose, Bld 114 (*)   ? All other components within normal limits  ?URINALYSIS, ROUTINE W REFLEX MICROSCOPIC - Abnormal; Notable for the following components:  ? APPearance CLOUDY (*)   ? Nitrite POSITIVE (*)   ? Leukocytes,Ua LARGE (*)   ? Bacteria, UA MANY (*)   ? All other components within normal limits  ? ? ?EKG ?None ? ?Radiology ?MR BRAIN WO CONTRAST ? ?Result Date: 10/16/2021 ?CLINICAL DATA:  Neuro deficit, acute, stroke suspected dysequilibrium - recent subdural but no change on CT scan EXAM: MRI HEAD WITHOUT CONTRAST TECHNIQUE: Multiplanar, multiecho pulse sequences of the brain and surrounding structures were obtained without intravenous contrast. COMPARISON:  Correlation made with recent CT FINDINGS: Brain: As seen on CT, there is subdural hemorrhage along the right aspect of the falx, right cerebral convexity, and right tentorial leaflet. Minor associated mass effect. There is small volume adjacent sulcal subarachnoid hemorrhage as well. No acute infarction. No intracranial mass or edema. No hydrocephalus. Patchy T2 hyperintensity in the supratentorial and pontine white matter is nonspecific but may reflect minor chronic microvascular ischemic changes. Vascular: Major vessel flow voids at the skull base are preserved. Skull and upper cervical spine: Normal marrow signal is preserved. Sinuses/Orbits:  Paranasal sinuses are aerated. Orbits are unremarkable. Other: Sella is unremarkable.  Mastoid air cells are clear. IMPRESSION: Subdural hemorrhage along the right cerebral convexity, falx, and tentorium with mi

## 2021-10-16 NOTE — ED Notes (Signed)
Pt d/c home per MD order. Discharge summary reviewed, pt verbalizes understanding. Off unit via WC. Discharged home with visitor. No s/s of acute distress noted at discharge.  ?

## 2021-10-16 NOTE — ED Provider Triage Note (Signed)
Emergency Medicine Provider Triage Evaluation Note ? ?Samantha Snyder , a 76 y.o. female  was evaluated in triage.  Pt complains of dysequilibrium since she left the hospital 4/14 when she had a subdural hematoma from a fall. ? ?She is walking with a walker now (was not using one before the fall).  ? ? ? ?Review of Systems  ?Positive: Headaches, dysequilibrium ?Negative: Fever ? ?Physical Exam  ?BP (!) 141/66 (BP Location: Left Arm)   Pulse 92   Temp 98.8 ?F (37.1 ?C) (Oral)   Resp 18   Ht '5\' 9"'$  (1.753 m)   Wt 87.1 kg   LMP  (LMP Unknown)   SpO2 98%   BMI 28.35 kg/m?  ?Gen:   Awake, no distress  ?Resp:  Normal effort  ?MSK:   Moves extremities without difficulty  ?Other:  Some mild weakness w extension of left arm at elbow.  ?Alert and oriented to self, place, time and event.  ? ?Speech is fluent, clear without dysarthria or dysphasia.  ? ?Strength 5/5 in upper/lower extremities   ?Sensation intact in upper/lower extremities  ? ?Negative Romberg. No pronator drift.  ?CN I not tested  ?CN II grossly intact visual fields bilaterally. Did not visualize posterior eye.  ?CN III, IV, VI PERRLA and EOMs intact bilaterally  ?CN V Intact sensation to sharp and light touch to the face  ?CN VII facial movements symmetric  ?CN VIII not tested  ?CN IX, X no uvula deviation, symmetric rise of soft palate  ?CN XI 5/5 SCM and trapezius strength bilaterally  ?CN XII Midline tongue protrusion, symmetric L/R movements   ? ?Medical Decision Making  ?Medically screening exam initiated at 11:12 AM.  Appropriate orders placed.  Lavonne Chick Riepe was informed that the remainder of the evaluation will be completed by another provider, this initial triage assessment does not replace that evaluation, and the importance of remaining in the ED until their evaluation is complete. ? ?Labs, MRI, urine ?  ?Tedd Sias, [place] ?10/16/21 1120 ? ?

## 2021-10-16 NOTE — ED Notes (Signed)
Patient transported to MRI 

## 2021-10-16 NOTE — Progress Notes (Signed)
Transition of Care [facility]) - Emergency Department Mini Assessment ? ? ?Patient Details  ?Name: Samantha Snyder ?MRN: [id number] ?Date of Birth: 1945-08-25 ? ?Transition of Care (TOC) CM/SW Contact:    ?Roseanne Kaufman, RN ?Phone Number: ?10/16/2021, 2:31 PM ? ? ?Clinical Narrative: ?Patient presented to [facility] ED due to fall at home on 10/02/2021 and PCP sent her to ED today for MRI. RNCM received TOC consult for HHC: PT. ? ? ?ED Mini Assessment: ? Kathreen Cornfield, BSN, RN, [place], [place] [phone] ?RNCM spoke with pt and husband via phone at bedside regarding discharge planning for [place]. Offered pt medicare.gov list of home health agencies to choose from. Patient does not have preference. Pt chose [facility] to render services. Cindie of [place] notified. Patient made aware that Alvis Lemmings will be in contact in 24-48 hours.  No DME needs identified at this time.  ?EDP and RN notified ?Patient resides at home with her husband.  ?No additional TOC needs at this time. ? ?Patient Contact and Communications ?  ?  Samantha Snyder ([id number]):[phone] ? ? Admission diagnosis:  fall;hit head on 4/18 ?Patient Active Problem List  ? Diagnosis Date Noted  ? Subdural hematoma ([place]) 10/05/2021  ? Emphysema lung ([place]) 12/19/2020  ? Aortic atherosclerosis ([place]) 07/08/2017  ? Diabetic peripheral neuropathy ([place]) 10/27/2014  ? Osteoporosis 03/30/2014  ? Essential hypertension 03/08/2014  ? DM (diabetes mellitus) type II controlled, neurological manifestation ([place]) 02/24/2014  ? Seasonal allergies 02/24/2014  ? Hyperlipidemia associated with type 2 diabetes mellitus ([place]) 02/24/2014  ? Tobacco abuse 02/24/2014  ? ?PCP:  Marin Olp, MD ?Pharmacy:   ?[id number] [place], [place] - [address] ?[id number]?Phone: [phone]: [phone]?  ?

## 2021-10-16 NOTE — Discharge Instructions (Addendum)
Your urine sample showed a likely urinary tract infection.  Please take the antibiotic as prescribed.  Please follow-up with a neurosurgeon regarding your subdural hematoma.   ? ?Please follow-up with your primary care doctor. ? ?Please follow-up with the home health agency to get physical therapy set up. ? ?Come back to ER if you develop increasing pain, vomiting, difficulty walking or other new concerning symptom. ?

## 2021-10-16 NOTE — ED Triage Notes (Signed)
Pt fell hitting head on April 11th, was seen and dx with CT Subdural hematoma. Saw her PCP and was told to come here for MRI ?

## 2021-10-17 ENCOUNTER — Encounter: Payer: Self-pay | Admitting: Family Medicine

## 2021-10-17 ENCOUNTER — Other Ambulatory Visit: Payer: Self-pay | Admitting: Family Medicine

## 2021-10-17 DIAGNOSIS — I609 Nontraumatic subarachnoid hemorrhage, unspecified: Secondary | ICD-10-CM

## 2021-10-17 DIAGNOSIS — S065XAA Traumatic subdural hemorrhage with loss of consciousness status unknown, initial encounter: Secondary | ICD-10-CM

## 2021-10-18 ENCOUNTER — Encounter: Payer: Self-pay | Admitting: Family Medicine

## 2021-10-21 ENCOUNTER — Encounter: Payer: Self-pay | Admitting: Family Medicine

## 2021-10-22 ENCOUNTER — Encounter: Payer: Self-pay | Admitting: Family Medicine

## 2021-10-23 ENCOUNTER — Other Ambulatory Visit: Payer: Self-pay

## 2021-10-23 DIAGNOSIS — R82998 Other abnormal findings in urine: Secondary | ICD-10-CM

## 2021-10-30 ENCOUNTER — Telehealth: Payer: Self-pay | Admitting: Family Medicine

## 2021-10-30 NOTE — Telephone Encounter (Signed)
.  Home Health Certification or Plan of Care Tracking ? ?Is this a Certification or Plan of Care? yes ? ?[place] Agency: [facility] care  ? ?Order Number:  [id number] ? ?Has charge sheet been attached? yes ? ?Where has form been placed:  In provider's box ? ?Faxed to:   [phone] ?

## 2021-10-31 DIAGNOSIS — Z6828 Body mass index (BMI) 28.0-28.9, adult: Secondary | ICD-10-CM | POA: Diagnosis not present

## 2021-10-31 DIAGNOSIS — S065XAA Traumatic subdural hemorrhage with loss of consciousness status unknown, initial encounter: Secondary | ICD-10-CM | POA: Diagnosis not present

## 2021-10-31 NOTE — Telephone Encounter (Signed)
Noted  

## 2021-11-02 ENCOUNTER — Encounter: Payer: Self-pay | Admitting: Family Medicine

## 2021-11-12 ENCOUNTER — Other Ambulatory Visit: Payer: Self-pay | Admitting: Neurosurgery

## 2021-11-12 ENCOUNTER — Encounter: Payer: Self-pay | Admitting: Family Medicine

## 2021-11-12 DIAGNOSIS — S065XAA Traumatic subdural hemorrhage with loss of consciousness status unknown, initial encounter: Secondary | ICD-10-CM

## 2021-11-14 ENCOUNTER — Other Ambulatory Visit: Payer: Self-pay | Admitting: Family Medicine

## 2021-11-27 ENCOUNTER — Encounter: Payer: Self-pay | Admitting: Family Medicine

## 2021-11-28 ENCOUNTER — Other Ambulatory Visit: Payer: Medicare PPO

## 2021-11-28 DIAGNOSIS — R82998 Other abnormal findings in urine: Secondary | ICD-10-CM | POA: Diagnosis not present

## 2021-11-28 NOTE — Telephone Encounter (Signed)
Pt stated she wanted to come in with husband to provide a urine sample for a UTI.  Pt declined to make an appointment with any provider, twice.  Pt states she will wait to have PCP put in orders for urine collection and come in to the office "next week" when spouse has PT on 12/05/21.  Not further follow up requested.

## 2021-11-30 LAB — URINE CULTURE
MICRO NUMBER:: 13495042
SPECIMEN QUALITY:: ADEQUATE

## 2021-12-04 ENCOUNTER — Ambulatory Visit
Admission: RE | Admit: 2021-12-04 | Discharge: 2021-12-04 | Disposition: A | Discharge status: Home / Self Care | Payer: Medicare PPO | Source: Ambulatory Visit | Attending: Neurosurgery | Admitting: Neurosurgery

## 2021-12-04 DIAGNOSIS — S065XAA Traumatic subdural hemorrhage with loss of consciousness status unknown, initial encounter: Secondary | ICD-10-CM

## 2021-12-04 DIAGNOSIS — I6200 Nontraumatic subdural hemorrhage, unspecified: Secondary | ICD-10-CM | POA: Diagnosis not present

## 2021-12-06 DIAGNOSIS — Z6828 Body mass index (BMI) 28.0-28.9, adult: Secondary | ICD-10-CM | POA: Diagnosis not present

## 2021-12-06 DIAGNOSIS — S065XAA Traumatic subdural hemorrhage with loss of consciousness status unknown, initial encounter: Secondary | ICD-10-CM | POA: Diagnosis not present

## 2021-12-07 ENCOUNTER — Other Ambulatory Visit: Payer: Self-pay | Admitting: Family Medicine

## 2021-12-10 ENCOUNTER — Ambulatory Visit (INDEPENDENT_AMBULATORY_CARE_PROVIDER_SITE_OTHER)
Admission: RE | Admit: 2021-12-10 | Discharge: 2021-12-10 | Disposition: A | Discharge status: Home / Self Care | Payer: Medicare PPO | Source: Ambulatory Visit | Attending: Acute Care | Admitting: Acute Care

## 2021-12-10 ENCOUNTER — Encounter: Payer: Self-pay | Admitting: Family Medicine

## 2021-12-10 ENCOUNTER — Other Ambulatory Visit: Payer: Self-pay

## 2021-12-10 DIAGNOSIS — F17210 Nicotine dependence, cigarettes, uncomplicated: Secondary | ICD-10-CM | POA: Diagnosis not present

## 2021-12-10 DIAGNOSIS — Z87891 Personal history of nicotine dependence: Secondary | ICD-10-CM | POA: Diagnosis not present

## 2021-12-10 MED ORDER — BD SWAB SINGLE USE REGULAR PADS: MEDICATED_PAD | 3 refills | Status: DC

## 2021-12-10 MED ORDER — TRUE METRIX AIR GLUCOSE METER DEVI: 3 refills | Status: DC

## 2021-12-10 NOTE — Telephone Encounter (Signed)
FYI

## 2021-12-12 ENCOUNTER — Other Ambulatory Visit: Payer: Self-pay | Admitting: Acute Care

## 2021-12-12 DIAGNOSIS — F17210 Nicotine dependence, cigarettes, uncomplicated: Secondary | ICD-10-CM

## 2021-12-12 DIAGNOSIS — Z87891 Personal history of nicotine dependence: Secondary | ICD-10-CM

## 2021-12-12 DIAGNOSIS — Z122 Encounter for screening for malignant neoplasm of respiratory organs: Secondary | ICD-10-CM

## 2022-01-13 ENCOUNTER — Encounter: Payer: Self-pay | Admitting: Family Medicine

## 2022-01-14 DIAGNOSIS — I10 Essential (primary) hypertension: Secondary | ICD-10-CM | POA: Diagnosis not present

## 2022-01-14 DIAGNOSIS — F17210 Nicotine dependence, cigarettes, uncomplicated: Secondary | ICD-10-CM | POA: Diagnosis not present

## 2022-01-14 DIAGNOSIS — E1142 Type 2 diabetes mellitus with diabetic polyneuropathy: Secondary | ICD-10-CM | POA: Diagnosis not present

## 2022-01-14 DIAGNOSIS — E785 Hyperlipidemia, unspecified: Secondary | ICD-10-CM | POA: Diagnosis not present

## 2022-01-14 DIAGNOSIS — Z7983 Long term (current) use of bisphosphonates: Secondary | ICD-10-CM | POA: Diagnosis not present

## 2022-01-14 DIAGNOSIS — M810 Age-related osteoporosis without current pathological fracture: Secondary | ICD-10-CM | POA: Diagnosis not present

## 2022-01-14 DIAGNOSIS — M1990 Unspecified osteoarthritis, unspecified site: Secondary | ICD-10-CM | POA: Diagnosis not present

## 2022-01-14 DIAGNOSIS — E1136 Type 2 diabetes mellitus with diabetic cataract: Secondary | ICD-10-CM | POA: Diagnosis not present

## 2022-01-14 DIAGNOSIS — K5900 Constipation, unspecified: Secondary | ICD-10-CM | POA: Diagnosis not present

## 2022-01-20 ENCOUNTER — Other Ambulatory Visit: Payer: Self-pay | Admitting: Family Medicine

## 2022-02-01 ENCOUNTER — Encounter: Payer: Self-pay | Admitting: Family Medicine

## 2022-02-01 ENCOUNTER — Ambulatory Visit: Payer: Medicare PPO | Admitting: Family Medicine

## 2022-02-01 VITALS — BP 118/74 | HR 70 | Temp 97.8°F | Ht 69.0 in | Wt 187.0 lb

## 2022-02-01 DIAGNOSIS — Z79899 Other long term (current) drug therapy: Secondary | ICD-10-CM | POA: Diagnosis not present

## 2022-02-01 DIAGNOSIS — E1142 Type 2 diabetes mellitus with diabetic polyneuropathy: Secondary | ICD-10-CM | POA: Diagnosis not present

## 2022-02-01 DIAGNOSIS — E785 Hyperlipidemia, unspecified: Secondary | ICD-10-CM

## 2022-02-01 DIAGNOSIS — I10 Essential (primary) hypertension: Secondary | ICD-10-CM | POA: Diagnosis not present

## 2022-02-01 DIAGNOSIS — E1169 Type 2 diabetes mellitus with other specified complication: Secondary | ICD-10-CM | POA: Diagnosis not present

## 2022-02-01 LAB — CBC WITH DIFFERENTIAL/PLATELET
Basophils Absolute: 0.1 10*3/uL (ref 0.0–0.1)
Basophils Relative: 0.9 % (ref 0.0–3.0)
Eosinophils Absolute: 0.1 10*3/uL (ref 0.0–0.7)
Eosinophils Relative: 1.2 % (ref 0.0–5.0)
HCT: 47.2 % — ABNORMAL HIGH (ref 36.0–46.0)
Hemoglobin: 15.4 g/dL — ABNORMAL HIGH (ref 12.0–15.0)
Lymphocytes Relative: 18.7 % (ref 12.0–46.0)
Lymphs Abs: 1.9 10*3/uL (ref 0.7–4.0)
MCHC: 32.7 g/dL (ref 30.0–36.0)
MCV: 93 fl (ref 78.0–100.0)
Monocytes Absolute: 0.6 10*3/uL (ref 0.1–1.0)
Monocytes Relative: 5.8 % (ref 3.0–12.0)
Neutro Abs: 7.5 10*3/uL (ref 1.4–7.7)
Neutrophils Relative %: 73.4 % (ref 43.0–77.0)
Platelets: 362 10*3/uL (ref 150.0–400.0)
RBC: 5.08 Mil/uL (ref 3.87–5.11)
RDW: 15.1 % (ref 11.5–15.5)
WBC: 10.2 10*3/uL (ref 4.0–10.5)

## 2022-02-01 LAB — COMPREHENSIVE METABOLIC PANEL
ALT: 17 U/L (ref 0–35)
AST: 16 U/L (ref 0–37)
Albumin: 4.4 g/dL (ref 3.5–5.2)
Alkaline Phosphatase: 92 U/L (ref 39–117)
BUN: 19 mg/dL (ref 6–23)
CO2: 28 mEq/L (ref 19–32)
Calcium: 9.8 mg/dL (ref 8.4–10.5)
Chloride: 105 mEq/L (ref 96–112)
Creatinine, Ser: 0.63 mg/dL (ref 0.40–1.20)
GFR: 87.35 mL/min (ref >60.00)
Glucose, Bld: 115 mg/dL — ABNORMAL HIGH (ref 70–99)
Potassium: 4.3 mEq/L (ref 3.5–5.1)
Sodium: 142 mEq/L (ref 135–145)
Total Bilirubin: 0.5 mg/dL (ref 0.2–1.2)
Total Protein: 7.1 g/dL (ref 6.0–8.3)

## 2022-02-01 LAB — MICROALBUMIN / CREATININE URINE RATIO
Creatinine,U: 38.4 mg/dL
Microalb Creat Ratio: 1.8 mg/g (ref 0.0–30.0)
Microalb, Ur: <0.7 mg/dL (ref 0.0–1.9)

## 2022-02-01 LAB — VITAMIN B12: Vitamin B-12: 562 pg/mL (ref 211–911)

## 2022-02-01 LAB — HEMOGLOBIN A1C: Hgb A1c MFr Bld: 6.7 % — ABNORMAL HIGH (ref 4.6–6.5)

## 2022-02-01 NOTE — Progress Notes (Signed)
Phone [phone] In person visit   Subjective:   Samantha Snyder is a 76 y.o. 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[place]) 02/24/2014    Priority: High   Tobacco abuse 02/24/2014    Priority: High   Aortic atherosclerosis ([place]) 07/08/2017    Priority: Medium    Osteoporosis 03/30/2014    Priority: Medium    Essential hypertension 03/08/2014    Priority: Medium    Hyperlipidemia associated with type 2 diabetes mellitus ([place]) 02/24/2014    Priority: Medium    Emphysema lung ([place]) 12/19/2020    Priority: Low   Diabetic peripheral neuropathy ([place]) 10/27/2014    Priority: Low   Seasonal allergies 02/24/2014    Priority: Low    Medications- reviewed and updated Current Outpatient Medications  Medication Sig Dispense Refill   Alcohol Swabs (B-D SINGLE USE SWABS REGULAR) PADS Use to cleanse are before checking blood sugar. Dx: E11.9 100 each 3   Alcohol Swabs PADS Use to clean area before checking blood sugars. 100 each 3   alendronate (FOSAMAX) 70 MG tablet TAKE 1 TABLET BY MOUTH EVERY 7 DAYS. TAKE WITH A FULL GLASS OF WATER ON AN EMPTY STOMACH. (Patient taking differently: Take 70 mg by mouth every Sunday.) 12 tablet 3   Alpha-Lipoic Acid 600 MG CAPS TAKE 1 CAPSULE (600 MG TOTAL) BY MOUTH DAILY. 60 capsule 5   atorvastatin (LIPITOR) 20 MG tablet TAKE 1 TABLET EVERY DAY 90 tablet 3   Blood Glucose Monitoring Suppl (TRUE METRIX AIR GLUCOSE METER) DEVI Use to test blood sugars daily. Dx: E11.9 1 each 3   Calcium Carbonate (CALCIUM 600 PO) Take 1,200 mg by mouth at bedtime.     Cholecalciferol (VITAMIN D3) 2000 UNITS TABS  Take 2,000 Units by mouth at bedtime.     diclofenac Sodium (VOLTAREN) 1 % GEL Apply 2 g topically 4 (four) times daily as needed (to painful sites of feet and hands).     fexofenadine (ALLEGRA) 180 MG tablet Take 180 mg by mouth daily.     lisinopril (ZESTRIL) 5 MG tablet TAKE 1 TABLET EVERY DAY 90 tablet 3   Magnesium 500 MG CAPS Take 500 mg by mouth at bedtime.     metFORMIN (GLUCOPHAGE) 1000 MG tablet TAKE 1 TABLET TWICE DAILY WITH MEALS 180 tablet 3   Multiple Vitamins-Minerals (HAIR SKIN AND NAILS FORMULA PO) Take 2 tablets by mouth daily.     Multiple Vitamins-Minerals (MULTIVITAMIN PO) Take 1 tablet by mouth at bedtime.     Omega-3 Fatty Acids (FISH OIL PO) Take 2,000 mg by mouth in the morning and at bedtime.     OVER THE COUNTER MEDICATION Take 240 mLs by mouth See admin instructions. Herbal tea- Drink 8 ounces by mouth daily     OZEMPIC, 0.25 OR 0.5 MG/DOSE, 2 MG/3ML SOPN INJECT 0.'5MG'$  AS DIRECTED ONCE WEEKLY (Patient taking differently: Inject 0.5 mg into the skin every Wednesday.) 3 mL 5   Probiotic Product (PROBIOTIC DAILY PO) Take 1 capsule by mouth every evening.     TRUE METRIX BLOOD GLUCOSE TEST test strip TEST BLOOD SUGAR  UP  TO FOUR TIMES DAILY 350 strip 0   TRUEplus Lancets 33G MISC TEST BLOOD SUGAR  UP  TO FOUR TIMES DAILY 400 each 0   No current facility-administered medications for this visit.     Objective:  BP 118/74   Pulse 70   Temp 97.8 F (36.6 C)   Ht '5\' 9"'$  (1.753 m)   Wt 187 lb (84.8 kg)   LMP  (LMP Unknown)   SpO2 98%   BMI 27.62 kg/m  Gen: NAD, resting comfortably CV: RRR no murmurs rubs or gallops Lungs: CTAB no crackles, wheeze, rhonchi Ext: trace edema Skin: warm, dry     Assessment and Plan   #subdural hematoma- continues to heal after prior incident- last seen [place] neurosurgery 12/06/21- was released after reassuring CT. Neuropathy likely led to fall.   #Bunion/hammer toe- not ready for surgery  #hypertension S: medication:  Lisinopril '5Mg'$  Home readings #s: doesn't check BP Readings from Last 3 Encounters:  02/01/22 118/74  10/16/21 125/75  10/15/21 108/60  A/P: Controlled. Continue current medications.   #hyperlipidemia #Aortic atherosclerosis-LDL goal under 70.  Incidental finding on imaging S: Medication: Atorvastatin '20Mg'$  -Prior on aspirin 81 mg for primary prevention-stopped after subdural hematoma April 2023 Lab Results  Component Value Date   CHOL 127 03/30/2021   HDL 43.90 03/30/2021   LDLCALC 47 03/30/2021   LDLDIRECT 73.0 03/11/2017   TRIG 181.0 (H) 03/30/2021   CHOLHDL 3 03/30/2021   A/P: cholesterol has been well controlled- continue current medicine- recheck next visit/physical  # Diabetes-A1c typically under 7 #Diabetic neuropathy-alpha lipoic acid generally helpful, also on voltaren gel  S: Medication: Metformin '1000Mg'$  twice daily, Ozempic 0.5 mg daily- notes diarrhea next day after dose Exercise and diet- down 2 lbs from last visit Lab Results  Component Value Date   HGBA1C 6.9 (H) 08/03/2021   HGBA1C 7.5 (H) 03/30/2021   HGBA1C 7.2 (H) 09/19/2020  A/P: diabetes- Controlled. Continue current medications if a1c stable.  Neuropathy stable. Check b12 with long term metformin - was ok 08/2020  #Tobacco abuse/COPD S:trying to cut back - about 1 PPD - lung cancer screening in June - no shortness of breath thankfully A/P: COPD largely asymptomatic. Advised quitting smoking- she is not ready to quit. Consider UA at CPE   #Osteoporosis S: Medication: Fosamax 70 mg started May 2022 Last DEXA: 10/26/2020 worst T score -2 point slightly worsened from -1.8 in 2019 at lumbar spine. Hip fracture risk at 4.9% low placing her in range to consider bisphosphonate with risk over 3%. Total fracture risk still only around 13% below threshold for treatment.  Calcium: '1200mg'$  (through diet ok) recommended  Vitamin D: 1000 units a day recommended A/P: hopefully stable- update bone density likely may 2024.  Continue current meds for now   #high hemoglobin- encouraged quitting smoking as well- check cbc today- discussed risks of high hgb Lab Results  Component Value Date   WBC 12.4 (H) 10/16/2021   HGB 15.2 (H) 10/16/2021   HCT 46.1 (H) 10/16/2021   MCV 93.1 10/16/2021   PLT 449 (H) 10/16/2021   Recommended follow up: Return in about 6 months (around 08/04/2022) for physical or sooner if needed.Schedule b4 you leave. Future Appointments  Date Time Provider [place]  04/08/2022  1:45 PM [facility] COACH [facility]    Lab/Order associations:   ICD-10-CM   1. Controlled type 2 diabetes mellitus with diabetic polyneuropathy, without long-term current use of insulin (HCC)  E11.42 Hemoglobin A1c  Microalbumin / creatinine urine ratio    Comprehensive metabolic panel    CBC with Differential/Platelet    2. Essential hypertension  I10 Comprehensive metabolic panel    CBC with Differential/Platelet    3. Hyperlipidemia associated with type 2 diabetes mellitus (HCC)  E11.69 Comprehensive metabolic panel   P82.4 CBC with Differential/Platelet    4. High risk medication use  Z79.899 Vitamin B12      No orders of the defined types were placed in this encounter.   Return precautions advised.  Garret Reddish, MD

## 2022-02-01 NOTE — Patient Instructions (Addendum)
Flu shot- we should have these available within a month or two but please let us know if you get at outside pharmacy -wait on new covid shot until october  Please stop by lab before you go If you have mychart- we will send your results within 3 business days of [place] receiving them.  If you do not have mychart- we will call you about results within 5 business days of [place] receiving them.  *please also note that you will see labs on mychart as soon as they post. I will later go in and write notes on them- will say "notes from Dr. Yong Channel"   Recommended follow up: Return in about 6 months (around 08/04/2022) for physical or sooner if needed.Schedule b4 you leave.

## 2022-02-04 ENCOUNTER — Encounter: Payer: Self-pay | Admitting: Physician Assistant

## 2022-02-04 ENCOUNTER — Ambulatory Visit (INDEPENDENT_AMBULATORY_CARE_PROVIDER_SITE_OTHER)
Admission: RE | Admit: 2022-02-04 | Discharge: 2022-02-04 | Disposition: A | Discharge status: Home / Self Care | Payer: Medicare PPO | Source: Ambulatory Visit | Attending: Physician Assistant | Admitting: Physician Assistant

## 2022-02-04 ENCOUNTER — Ambulatory Visit: Payer: Medicare PPO | Admitting: Physician Assistant

## 2022-02-04 ENCOUNTER — Encounter: Payer: Self-pay | Admitting: Family Medicine

## 2022-02-04 VITALS — BP 120/80 | HR 90 | Temp 97.8°F | Ht 69.0 in | Wt 188.4 lb

## 2022-02-04 DIAGNOSIS — S91105A Unspecified open wound of left lesser toe(s) without damage to nail, initial encounter: Secondary | ICD-10-CM

## 2022-02-04 DIAGNOSIS — M7989 Other specified soft tissue disorders: Secondary | ICD-10-CM | POA: Diagnosis not present

## 2022-02-04 MED ORDER — AMOXICILLIN-POT CLAVULANATE 875-125 MG PO TABS: 1.0000 | ORAL_TABLET | Freq: Two times a day (BID) | ORAL | 0 refills | Status: AC

## 2022-02-04 NOTE — Patient Instructions (Signed)
Please go to [facility] for XR of this toe. Start on Augmentin antibiotics as directed. Monitor closely! Call sooner if any worse pain, redness, oozing, or fever develops.  Keep appt with podiatry and wound care.   Take care!

## 2022-02-04 NOTE — Progress Notes (Signed)
Subjective:    Patient ID: Samantha Snyder, female    DOB: 18-Nov-1945, 76 y.Snyder.   MRN: [id number]  Chief Complaint  Patient presents with   Hammer Toe    Pthas soreon toe due to hammer toe on left foot, has been bothering her for several weeks but now has open sore on toe. Using neosporin on the sore as well. Has appt at wound center on 28th and could get in with [facility] on 8/23 at 9am.     HPI Patient with controlled T2DM - last Ha1c 6.7 on 02/01/22, is in today for left 2nd toe wound x 1 week. She has a hammer toe. Pain with palpation. No drainage. No odor. Some redness around site, but says it's looking better today than the last two days. Wound care appt on 8/28; [facility] appt 02/13/22. No fever or chills. No other symptoms. Wears diabetic shoes. Has a supportive wedge to wear between great toe and 2nd toe.   Past Medical History:  Diagnosis Date   Allergy    minor hay fever   Arthritis    hands x 2, knee    Cataract    small   Chicken pox 02/24/2014   Has had shingles vaccine   Chronic diarrhea    For one year-needed prolonged course of antibiotics   Diabetes mellitus without complication ([place])    Diverticulosis 02/24/2014   Emphysema of lung (HCC)    mild per pt. 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 Improved urinary incontinence    Past Surgical History:  Procedure Laterality Date   ABDOMINAL HYSTERECTOMY  09/07/2013   ABDOMINAL SACROCOLPOPEXY  09/07/2013   BLADDER SURGERY  09/07/2013   vaginal prolapse   BREAST BIOPSY Right    2014? near nipple. benign.   COLONOSCOPY  11/06/2005   sig tics, no polyps-[facility]    CYSTOSCOPY  09/07/2013   ENTEROCELE REPAIR  09/07/2013   INDUCED ABORTION   1985   Therapeutic   LAPAROSCOPIC BILATERAL SALPINGO OOPHERECTOMY  09/07/2013   POLYPECTOMY     PUBOVAGINAL SLING  09/07/2013   TONSILLECTOMY Bilateral 1955   TUBAL LIGATION      Family History  Problem Relation Age of Onset   Cancer Father        Lung but was a smoker   Stroke Father        50 massive lead to death   Diabetes Father    Bladder Cancer Father    Lung cancer Father    Osteoporosis Mother    Anuerysm Mother    Diabetes Maternal Grandmother    Colon cancer Neg Hx    Colon polyps Neg Hx    Esophageal cancer Neg Hx    Rectal cancer Neg Hx    Stomach cancer Neg Hx    Breast cancer Neg Hx     Social History   Tobacco Use   Smoking status: Every Day    Packs/day: 1.00    Years: 53.00    Total pack years: 53.00    Types: Cigarettes   Smokeless tobacco: Never   Tobacco comments:  Counseled that the single most powerful action she could take to decrease her risk of lung cancer is to quit smoking  Substance Use Topics   Alcohol use: No    Alcohol/week: 0.0 standard drinks of alcohol   Drug use: No     Allergies  Allergen Reactions   Fluoride Preparations Nausea Only    Patient states it burned her mouth and she had abdominal pain for 24-36 hours (dental office)    Review of Systems NEGATIVE UNLESS OTHERWISE INDICATED IN HPI      Objective:     BP 120/80 (BP Location: Left Arm)   Pulse 90   Temp 97.8 F (36.6 C) (Temporal)   Ht '5\' 9"'$  (1.753 m)   Wt 188 lb 6.4 oz (85.5 kg)   LMP  (LMP Unknown)   SpO2 95%   BMI 27.82 kg/m   Wt Readings from Last 3 Encounters:  02/04/22 188 lb 6.4 oz (85.5 kg)  02/01/22 187 lb (84.8 kg)  10/16/21 192 lb (87.1 kg)    BP Readings from Last 3 Encounters:  02/04/22 120/80  02/01/22 118/74  10/16/21 125/75     Physical Exam Constitutional:      Appearance: Normal appearance.  Skin:    Comments: Wound, left 2nd toe - see photos  N/V intact Pedal pulses normal   Neurological:     Mental Status: She  is alert.            Assessment & Plan:   Problem List Items Addressed This Visit   None Visit Diagnoses     Open wound of second toe of left foot, initial encounter    -  Primary   Relevant Orders   DG Toe 2nd Left        Meds ordered this encounter  Medications   amoxicillin-clavulanate (AUGMENTIN) 875-125 MG tablet    Sig: Take 1 tablet by mouth 2 (two) times daily for 7 days.    Dispense:  14 tablet    Refill:  0    Order Specific Question:   Supervising Provider    Answer:   Samantha Snyder [[id number]]    1. Open wound of second toe of left foot, initial encounter With pt's hx of T2DM, will order XRAY to r/Snyder underlying bony involvement Start on Augmentin as directed Had PT Samantha Snyder look at wound as well - gave her an open toed walking shoe Keep covered with Vaseline and Band-Aid LOW THRESHOLD for ED right away if any red flag sx's as discussed She will keep f/up appts with podiatry and wound care; recheck with [place] prn     Samantha Foskey M Cheryel Kyte, PA-C

## 2022-02-05 ENCOUNTER — Encounter: Payer: Self-pay | Admitting: Physician Assistant

## 2022-02-05 NOTE — Telephone Encounter (Signed)
Please see pt msg and advise 

## 2022-02-07 ENCOUNTER — Other Ambulatory Visit: Payer: Self-pay

## 2022-02-07 MED ORDER — BD SWAB SINGLE USE REGULAR PADS: MEDICATED_PAD | 3 refills | Status: DC

## 2022-02-13 ENCOUNTER — Ambulatory Visit: Payer: Medicare PPO | Admitting: Podiatry

## 2022-02-13 ENCOUNTER — Encounter: Payer: Self-pay | Admitting: Physician Assistant

## 2022-02-13 ENCOUNTER — Encounter: Payer: Self-pay | Admitting: Family Medicine

## 2022-02-13 ENCOUNTER — Ambulatory Visit (INDEPENDENT_AMBULATORY_CARE_PROVIDER_SITE_OTHER): Payer: Medicare PPO

## 2022-02-13 DIAGNOSIS — M2042 Other hammer toe(s) (acquired), left foot: Secondary | ICD-10-CM

## 2022-02-13 NOTE — Progress Notes (Signed)
Chief Complaint  Patient presents with   Hammer Toe    Left foot second toe pain, the patient is diabetic and has neuropathy.last A1c 6.7      HPI: 76 y.o. female presenting today as a reestablish new patient for evaluation of a symptomatic hammertoe which has developed a wound overlying the PIPJ.  The wound has been present for about a month.  She has had constant pain with achiness for several months however.  Her shoes developed to the wound.  She also has bunion deformities bilateral.  She says that the bunions do not bother her and are not symptomatic or painful.  She presents for further treatment and evaluation  Past Medical History:  Diagnosis Date   Allergy    minor hay fever   Arthritis    hands x 2, knee    Cataract    small   Chicken pox 02/24/2014   Has had shingles vaccine   Chronic diarrhea    For one year-needed prolonged course of antibiotics   Diabetes mellitus without complication ([place])    Diverticulosis 02/24/2014   Emphysema of lung (HCC)    mild per pt. 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 Improved urinary incontinence    Past Surgical History:  Procedure Laterality Date   ABDOMINAL HYSTERECTOMY  09/07/2013   ABDOMINAL SACROCOLPOPEXY  09/07/2013   BLADDER SURGERY  09/07/2013   vaginal prolapse   BREAST BIOPSY Right    2014? near nipple. benign.   COLONOSCOPY  11/06/2005   sig tics, no polyps-[facility] [place]    CYSTOSCOPY  09/07/2013   ENTEROCELE REPAIR  09/07/2013   INDUCED ABORTION  1985   Therapeutic   LAPAROSCOPIC BILATERAL SALPINGO OOPHERECTOMY  09/07/2013   POLYPECTOMY     PUBOVAGINAL SLING  09/07/2013   TONSILLECTOMY Bilateral 1955   TUBAL LIGATION      Allergies  Allergen Reactions    Fluoride Preparations Nausea Only    Patient states it burned her mouth and she had abdominal pain for 24-36 hours (dental office)         Physical Exam: General: The patient is alert and oriented x3 in no acute distress.  Dermatology: Ulcer noted overlying the PIPJ of the second digit left foot with fat layer exposed.  Currently there is no exposed bone.  Serous drainage.  It measures about 1 cm in diameter.  Vascular: Palpable pedal pulses bilaterally. Capillary refill within normal limits.  Negative for any significant edema or erythema  Neurological: Light touch and protective threshold diminished  Musculoskeletal Exam: Hallux valgus deformity noted bilateral.  Asymptomatic with palpation and range of motion.  She does have a significant amount of pain and tenderness associated to the second digit hammertoe left.  Hammertoe deformity contributing to the overlying ulcer which rubs in her shoes  Radiographic Exam:  Hallux valgus deformity noted with an increased intermetatarsal angle and hallux abductus angle with crossover deformity of the second digit.  Diffuse degenerative changes also noted throughout both foot.  No acute fractures identified.  No osseous erosions or cortical irregularities that would be concerning for osteomyelitis  Assessment: 1.  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  Asymptomatic hallux valgus deformity bilateral   Plan of Care:  1. Patient evaluated. X-Rays reviewed.  2.  Patient has an appointment on Monday, 02/18/2022, with the [facility] wound care center.  Recommend follow-up there for routine foot care 3.  Patient currently wearing a postsurgical shoe.  Wear daily 4.  Today we discussed different options for the patient to provide lasting relief.  The first surgical option would be bunion and hammertoe correction.  This would entail a longer recovery and more invasive type surgery including osteotomies with internal hardware fixation.   The bunion deformity does not bother the patient and it is asymptomatic.  We also discussed the possibility of more simple second toe amputation to the left foot.  The patient is more interested and amenable to the amputation of the toe versus reconstruction of the forefoot. 5.  Patient is going to the [employer] next week.  Return to clinic after her trip to discuss possible toe potation left second digit      Edrick Kins, DPM [facility]  Dr. Edrick Kins, DPM    [address], [place] [address]                Office [phone]  Fax [phone]

## 2022-02-18 ENCOUNTER — Encounter (HOSPITAL_BASED_OUTPATIENT_CLINIC_OR_DEPARTMENT_OTHER): Payer: Medicare PPO | Attending: Internal Medicine | Admitting: Internal Medicine

## 2022-02-18 DIAGNOSIS — J439 Emphysema, unspecified: Secondary | ICD-10-CM | POA: Insufficient documentation

## 2022-02-18 DIAGNOSIS — E11621 Type 2 diabetes mellitus with foot ulcer: Secondary | ICD-10-CM | POA: Diagnosis not present

## 2022-02-18 DIAGNOSIS — E1142 Type 2 diabetes mellitus with diabetic polyneuropathy: Secondary | ICD-10-CM | POA: Insufficient documentation

## 2022-02-18 DIAGNOSIS — L97522 Non-pressure chronic ulcer of other part of left foot with fat layer exposed: Secondary | ICD-10-CM | POA: Insufficient documentation

## 2022-02-18 DIAGNOSIS — M2042 Other hammer toe(s) (acquired), left foot: Secondary | ICD-10-CM | POA: Insufficient documentation

## 2022-02-18 DIAGNOSIS — F17210 Nicotine dependence, cigarettes, uncomplicated: Secondary | ICD-10-CM | POA: Diagnosis not present

## 2022-02-22 NOTE — Progress Notes (Signed)
Samantha Snyder, Samantha Snyder ([id number]) Visit Report for 02/18/2022 Allergy List Details Patient Name: Date of Service: BLA LO [place]. 02/18/2022 8:00 A M Medical Record Number: [id number] Patient Account Number: [id number] Date of Birth/Sex: Treating RN: 1945-11-17 (76 y.o. Samantha Snyder Primary Care Shakiara Lukic: Garret Reddish Other Clinician: Referring Taetum Flewellen: Treating Azucena Dart/Extender: Nelva Bush in Treatment: 0 Allergies Active Allergies fluoride Reaction: nausea Allergy Notes Electronic Signature(s) Signed: 02/22/2022 1:10:23 PM By: Rhae Hammock RN Entered By: Rhae Hammock on 02/18/2022 08:15:54 -------------------------------------------------------------------------------- Arrival Information Details Patient Name: Date of Service: BLA LO CK, Samantha Snyder. 02/18/2022 8:00 A M Medical Record Number: [id number] Patient Account Number: [id number] Date of Birth/Sex: Treating RN: Feb 08, 1946 (76 y.o. Samantha Snyder, Lauren Primary Care Raynell Upton: Garret Reddish Other Clinician: Referring Carlita Whitcomb: Treating Chanell Nadeau/Extender: Nelva Bush in Treatment: 0 Visit Information Patient Arrived: Ambulatory Arrival Time: 08:11 Accompanied By: SELF Transfer Assistance: None Patient Identification Verified: Yes Secondary Verification Process Completed: Yes Patient Requires Transmission-Based Precautions: No Patient Has Alerts: No Electronic Signature(s) Signed: 02/22/2022 1:10:23 PM By: Rhae Hammock RN Entered By: Rhae Hammock on 02/18/2022 08:13:03 -------------------------------------------------------------------------------- Clinic Level of Care Assessment Details Patient Name: Date of Service: BLA LO CK, Samantha Snyder. 02/18/2022 8:00 A M Medical Record Number: [id number] Patient Account Number: [id number] Date of Birth/Sex: Treating RN: [birth year] (76 y.o. Samantha Snyder, Lauren Primary Care Ovid Witman: Garret Reddish Other  Clinician: Referring Lamis Behrmann: Treating Chirsty Armistead/Extender: Nelva Bush in Treatment: 0 Clinic Level of Care Assessment Items TOOL 4 Quantity Score X- 1 0 Use when only an EandM is performed on FOLLOW-UP visit ASSESSMENTS - Nursing Assessment / Reassessment X- 1 10 Reassessment of Co-morbidities (includes updates in patient status) X- 1 5 Reassessment of Adherence to Treatment Plan ASSESSMENTS - Wound and Skin A ssessment / Reassessment X - Simple Wound Assessment / Reassessment - one wound 1 5 '[]'$  - 0 Complex Wound Assessment / Reassessment - multiple wounds '[]'$  - 0 Dermatologic / Skin Assessment (not related to wound area) ASSESSMENTS - Focused Assessment X- 1 5 Circumferential Edema Measurements - multi extremities '[]'$  - 0 Nutritional Assessment / Counseling / Intervention '[]'$  - 0 Lower Extremity Assessment (monofilament, tuning fork, pulses) '[]'$  - 0 Peripheral Arterial Disease Assessment (using hand held doppler) ASSESSMENTS - Ostomy and/or Continence Assessment and Care '[]'$  - 0 Incontinence Assessment and Management '[]'$  - 0 Ostomy Care Assessment and Management (repouching, etc.) PROCESS - Coordination of Care X - Simple Patient / Family Education for ongoing care 1 15 '[]'$  - 0 Complex (extensive) Patient / Family Education for ongoing care X- 1 10 Staff obtains Programmer, systems, Records, T Results / Process Orders est '[]'$  - 0 Staff telephones HHA, Nursing Homes / Clarify orders / etc '[]'$  - 0 Routine Transfer to another Facility (non-emergent condition) '[]'$  - 0 Routine Hospital Admission (non-emergent condition) X- 1 15 New Admissions / Biomedical engineer / Ordering NPWT Apligraf, etc. , '[]'$  - 0 Emergency Hospital Admission (emergent condition) X- 1 10 Simple Discharge Coordination '[]'$  - 0 Complex (extensive) Discharge Coordination PROCESS - Special Needs '[]'$  - 0 Pediatric / Minor Patient Management '[]'$  - 0 Isolation Patient Management '[]'$  -  0 Hearing / Language / Visual special needs '[]'$  - 0 Assessment of Community assistance (transportation, D/C planning, etc.) '[]'$  - 0 Additional assistance / Altered mentation '[]'$  - 0 Support Surface(s) Assessment (bed, cushion, seat, etc.) INTERVENTIONS - Wound Cleansing / Measurement X - Simple Wound Cleansing - one wound 1 5 '[]'$  -  0 Complex Wound Cleansing - multiple wounds X- 1 5 Wound Imaging (photographs - any number of wounds) '[]'$  - 0 Wound Tracing (instead of photographs) X- 1 5 Simple Wound Measurement - one wound '[]'$  - 0 Complex Wound Measurement - multiple wounds INTERVENTIONS - Wound Dressings X - Small Wound Dressing one or multiple wounds 1 10 '[]'$  - 0 Medium Wound Dressing one or multiple wounds '[]'$  - 0 Large Wound Dressing one or multiple wounds '[]'$  - 0 Application of Medications - topical '[]'$  - 0 Application of Medications - injection INTERVENTIONS - Miscellaneous '[]'$  - 0 External ear exam '[]'$  - 0 Specimen Collection (cultures, biopsies, blood, body fluids, etc.) '[]'$  - 0 Specimen(s) / Culture(s) sent or taken to Lab for analysis '[]'$  - 0 Patient Transfer (multiple staff / Civil Service fast streamer / Similar devices) '[]'$  - 0 Simple Staple / Suture removal (25 or less) '[]'$  - 0 Complex Staple / Suture removal (26 or more) '[]'$  - 0 Hypo / Hyperglycemic Management (close monitor of Blood Glucose) X- 1 15 Ankle / Brachial Index (ABI) - do not check if billed separately X- 1 5 Vital Signs Has the patient been seen at the hospital within the last three years: Yes Total Score: 120 Level Of Care: New/Established - Level 4 Electronic Signature(s) Signed: 02/22/2022 1:10:23 PM By: Rhae Hammock RN Entered By: Rhae Hammock on 02/18/2022 09:26:17 -------------------------------------------------------------------------------- Encounter Discharge Information Details Patient Name: Date of Service: BLA LO CK, Samantha Snyder. 02/18/2022 8:00 A M Medical Record Number: [id number] Patient Account  Number: [id number] Date of Birth/Sex: Treating RN: [birth year] (76 y.o. Samantha Snyder, Lauren Primary Care Adriane Gabbert: Garret Reddish Other Clinician: Referring Ysabelle Goodroe: Treating Makyiah Lie/Extender: Nelva Bush in Treatment: 0 Encounter Discharge Information Items Discharge Condition: Stable Ambulatory Status: Ambulatory Discharge Destination: Home Transportation: Private Auto Accompanied By: self Schedule Follow-up Appointment: Yes Clinical Summary of Care: Patient Declined Electronic Signature(s) Signed: 02/22/2022 1:10:23 PM By: Rhae Hammock RN Entered By: Rhae Hammock on 02/18/2022 09:29:53 -------------------------------------------------------------------------------- Lower Extremity Assessment Details Patient Name: Date of Service: BLA LO CK, Samantha Snyder. 02/18/2022 8:00 A M Medical Record Number: [id number] Patient Account Number: [id number] Date of Birth/Sex: Treating RN: Oct 28, 1945 (76 y.o. Samantha Snyder, Lauren Primary Care Marni Franzoni: Garret Reddish Other Clinician: Referring Kaz Auld: Treating Nazire Fruth/Extender: Nelva Bush in Treatment: 0 Edema Assessment Assessed: Shirlyn Goltz: Yes] Patrice Paradise: No] Edema: [Left: Ye] [Right: s] Calf Left: Right: Point of Measurement: 38 cm From Medial Instep 34 cm Ankle Left: Right: Point of Measurement: 9 cm From Medial Instep 22 cm Knee To Floor Left: Right: From Medial Instep 46 cm Vascular Assessment Pulses: Dorsalis Pedis Palpable: [Left:Yes] Posterior Tibial Palpable: [Left:Yes] Blood Pressure: Brachial: [Left:135] Ankle: [Left:Dorsalis Pedis: 130 0.96] Electronic Signature(s) Signed: 02/22/2022 1:10:23 PM By: Rhae Hammock RN Entered By: Rhae Hammock on 02/18/2022 08:34:51 -------------------------------------------------------------------------------- Multi Wound Chart Details Patient Name: Date of Service: BLA LO CK, Samantha Snyder. 02/18/2022 8:00 A M Medical  Record Number: [id number] Patient Account Number: [id number] Date of Birth/Sex: Treating RN: [birth year] (76 y.o. 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 Primary Etiology: Extremity Cataracts, Coronary Artery Disease, N/A N/A Comorbid History: Hypertension,  Type II Diabetes, Osteoarthritis 02/01/2022 N/A N/A Date Acquired: 0 N/A N/A Weeks of Treatment: Open N/A N/A Wound Status: No N/A N/A Wound Recurrence: Yes N/A N/A Pending A mputation on Presentation: 0.3x0.5x0.3 N/A N/A Measurements L x W x D (cm) 0.118 N/A N/A A (cm) : rea 0.035 N/A N/A Volume (cm) : 0.00% N/A N/A % Reduction in A rea: 0.00% N/A N/A % Reduction in Volume: 12 Starting Position 1 (o'clock): 12 Ending Position 1 (o'clock): 0.5 Maximum Distance 1 (cm): Yes N/A N/A Undermining: Grade 2 N/A N/A Classification: Medium N/A N/A Exudate A mount: Serosanguineous N/A N/A Exudate Type: red, brown N/A N/A Exudate Color: Distinct, outline attached N/A N/A Wound Margin: None Present (0%) N/A N/A Granulation A mount: Large (67-100%) N/A N/A Necrotic A mount: Eschar, Adherent Slough N/A N/A Necrotic Tissue: Fascia: No N/A N/A Exposed Structures: Fat Layer (Subcutaneous Tissue): No Tendon: No Muscle: No Joint: No Bone: No None N/A N/A Epithelialization: Treatment Notes Electronic Signature(s) Signed: 02/18/2022 10:13:31 AM By: Kalman Shan DO Entered By: Kalman Shan on 02/18/2022  09:18:51 -------------------------------------------------------------------------------- Multi-Disciplinary Care Plan Details Patient Name: Date of Service: BLA LO CK, Samantha Snyder. 02/18/2022 8:00 A M Medical Record Number: [id number] Patient Account Number: [id number] Date of Birth/Sex: Treating RN: [birth year] (76 y.o. Samantha Snyder, Lauren Primary Care Asees Manfredi: Garret Reddish Other Clinician: Referring Galaxy Borden: Treating Kaitrin Seybold/Extender: Nelva Bush in Treatment: 0 Active Inactive Electronic Signature(s) Signed: 02/22/2022 1:10:23 PM By: Rhae Hammock RN Signed: 02/22/2022 1:10:23 PM By: Rhae Hammock RN Entered By: Rhae Hammock on 02/22/2022 11:56:50 -------------------------------------------------------------------------------- Pain Assessment Details Patient Name: Date of Service: BLA LO CK, Samantha Snyder. 02/18/2022 8:00 A M Medical Record Number: [id number] Patient Account Number: [id number] Date of Birth/Sex: Treating RN: August 14, 1945 (76 y.o. Samantha Snyder, Lauren Primary Care Gery Sabedra: Garret Reddish Other Clinician: Referring Tnya Ades: Treating Aneka Fagerstrom/Extender: Nelva Bush in Treatment: 0 Active Problems Location of Pain Severity and Description of Pain Patient Has Paino Yes Site Locations Pain Location: Generalized Pain, Pain in Ulcers With Dressing Change: Yes Duration of the Pain. Constant / Intermittento Constant Rate the pain. Current Pain Level: 7 Worst Pain Level: 10 Least Pain Level: 0 Tolerable Pain Level: 7 Character of Pain Describe the Pain: Aching Pain Management and Medication Current Pain Management: Medication: No Cold Application: No Rest: No Massage: No Activity: No T.E.N.S.: No Heat Application: No Leg drop or elevation: No Is the Current Pain Management Adequate: Adequate How does your wound impact your activities of daily livingo Sleep: No Bathing: No Appetite:  No Relationship With Others: No Bladder Continence: No Emotions: No Bowel Continence: No Work: No Toileting: No Drive: No Dressing: No Hobbies: No Electronic Signature(s) Signed: 02/22/2022 1:10:23 PM By: Rhae Hammock RN Entered By: Rhae Hammock on 02/18/2022 08:20:34 -------------------------------------------------------------------------------- Patient/Caregiver Education Details Patient Name: Date of Service: BLA LO CK, JA N W. 8/28/2023andnbsp8:00 A M Medical Record Number: [id number] Patient Account Number: [id number] Date of Birth/Gender: Treating RN: [birth year] (76 y.o. Samantha Snyder, Lauren Primary Care Physician: Garret Reddish Other Clinician: Referring Physician: Treating Physician/Extender: Nelva Bush in Treatment: 0 Education Assessment Education Provided To: Patient Education Topics Provided [place]: o Methods: Explain/Verbal Responses: Reinforcements needed, State content correctly Electronic Signature(s) Signed: 02/22/2022 1:10:23 PM By: Rhae Hammock RN Entered By: Rhae Hammock on 02/18/2022 08:48:15 -------------------------------------------------------------------------------- Wound Assessment Details Patient Name: Date of Service: BLA LO CK, Samantha Snyder. 02/18/2022 8:00 A M Medical Record Number: [id number] Patient Account  Number: [id number] Date of Birth/Sex: Treating RN: 11-04-1945 (77 y.o. Samantha Snyder, Lauren Primary Care Ryota Treece: Garret Reddish Other Clinician: Referring Javon Snee: Treating Terreon Ekholm/Extender: Nelva Bush in Treatment: 0 Wound Status Wound Number: 1 Primary Diabetic Wound/Ulcer of the Lower Extremity Etiology: Wound Location: Left T Second oe Wound Open Wounding Event: Gradually Appeared Status: Date Acquired: 02/01/2022 Comorbid Cataracts, Coronary Artery Disease, Hypertension, Type II Weeks Of Treatment: 0 History: Diabetes,  Osteoarthritis Clustered Wound: No Photos Wound Measurements Length: (cm) 0.3 Width: (cm) 0.5 Depth: (cm) 0.3 Area: (cm) 0.118 Volume: (cm) 0.035 Wound Description Classification: Grade 2 Wound Margin: Distinct, outline attached Exudate Amount: Medium Exudate Type: Serosanguineous Exudate Color: red, brown Foul Odor After Cleansing: No Slough/Fibrino Ye % Reduction in Area: 0% % Reduction in Volume: 0% Epithelialization: None Tunneling: No Undermining: Yes Starting Position (o'clock): 12 Ending Position (o'clock): 12 Maximum Distance: (cm) 0.5 s Wound Bed Granulation Amount: None Present (0%) Exposed Structure Necrotic Amount: Large (67-100%) Fascia Exposed: No Necrotic Quality: Eschar, Adherent Slough Fat Layer (Subcutaneous Tissue) Exposed: No Tendon Exposed: No Muscle Exposed: No Joint Exposed: No Bone Exposed: No Electronic Signature(s) Signed: 02/22/2022 1:10:23 PM By: Rhae Hammock RN Entered By: Rhae Hammock on 02/18/2022 08:37:28 -------------------------------------------------------------------------------- Vitals Details Patient Name: Date of Service: BLA LO CK, Samantha Snyder. 02/18/2022 8:00 A M Medical Record Number: [id number] Patient Account Number: [id number] Date of Birth/Sex: Treating RN: 10-29-1945 (76 y.o. Samantha Snyder, Lauren Primary Care Sherrina Zaugg: Garret Reddish Other Clinician: Referring [place] Sloop: Treating Tatijana Bierly/Extender: Nelva Bush in Treatment: 0 Vital Signs Time Taken: 08:14 Temperature (F): 98.5 Height (in): 68 Pulse (bpm): 84 Source: Stated Respiratory Rate (breaths/min): 17 Weight (lbs): 188 Blood Pressure (mmHg): 139/79 Source: Stated Capillary Blood Glucose (mg/dl): 112 Body Mass Index (BMI): 28.6 Reference Range: 80 - 120 mg / dl Electronic Signature(s) Signed: 02/22/2022 1:10:23 PM By: Rhae Hammock RN Entered By: Rhae Hammock on 02/18/2022 08:15:45

## 2022-02-22 NOTE — Progress Notes (Signed)
MASON, BURLEIGH ([id number]) Visit Report for 02/18/2022 Chief Complaint Document Details Patient Name: Date of Service: BLA LO [place] Constable. 02/18/2022 8:00 A M Medical Record Number: [id number] Patient Account Number: [id number] Date of Birth/Sex: Treating RN: [birth year] (76 y.o. F) Primary Care Provider: Garret Reddish Other Clinician: Referring Provider: Treating Provider/Extender: Nelva Bush in Treatment: 0 Information Obtained from: Patient Chief Complaint 02/18/2022; left second toe wound Electronic Signature(s) Signed: 02/18/2022 10:13:31 AM By: Kalman Shan DO Entered By: Kalman Shan on 02/18/2022 09:19:19 -------------------------------------------------------------------------------- HPI Details Patient Name: Date of Service: BLA LO CK, [name]. 02/18/2022 8:00 A M Medical Record Number: [id number] Patient Account Number: [id number] Date of Birth/Sex: Treating RN: [birth year] (76 y.o. F) Primary Care Provider: Garret Reddish Other Clinician: Referring Provider: Treating Provider/Extender: Nelva Bush in Treatment: 0 History of Present Illness HPI Description: 02/18/2022 Ms. Yarima Penman is a 76 year old female with a past medical history of currently controlled type 2 diabetes complicated by peripheral neuropathy and emphysema that presents to the clinic for a wound to the left second hammertoe. She states that this started 1 month ago. It was discovered while she was doing physical therapy. She has been wearing closed toed shoes when the wound was discovered. She has been wearing a surgical open toed shoe for the past week. She has been using Vaseline to the wound bed. She has been following Dr. Amalia Hailey for this issue. Plan is for amputation. Patient will be going on vacation and returns in 1- 2 weeks. She has follow-up with Dr. Amalia Hailey on 9/13. Patient presents today for dressing recommendations. She currently denies  signs of infection. Electronic Signature(s) Signed: 02/18/2022 10:13:31 AM By: Kalman Shan DO Entered By: Kalman Shan on 02/18/2022 09:25:28 -------------------------------------------------------------------------------- Physical Exam Details Patient Name: Date of Service: BLA LO CK, [name]. 02/18/2022 8:00 A M Medical Record Number: [id number] Patient Account Number: [id number] Date of Birth/Sex: Treating RN: Nov 11, 1945 (76 y.o. F) Primary Care Provider: Other Clinician: Garret Reddish Referring Provider: Treating Provider/Extender: Nelva Bush in Treatment: 0 Constitutional respirations regular, non-labored and within target range for patient.. Cardiovascular 2+ dorsalis pedis/posterior tibialis pulses. Psychiatric pleasant and cooperative. Notes Left foot: Second toe with hammertoe deformity. Open wound to the dorsal aspect that is undermining from the 3 to 9 o'clock position. Granulation tissue and nonviable tissue noted at the opening. No signs of surrounding soft tissue infection including increased warmth, erythema or drainage. Electronic Signature(s) Signed: 02/18/2022 10:13:31 AM By: Kalman Shan DO Entered By: Kalman Shan on 02/18/2022 09:26:19 -------------------------------------------------------------------------------- Physician Orders Details Patient Name: Date of Service: BLA LO CK, [name]. 02/18/2022 8:00 A M Medical Record Number: [id number] Patient Account Number: [id number] Date of Birth/Sex: Treating RN: 06/29/1945 (76 y.o. [name], Lauren Primary Care Provider: Garret Reddish Other Clinician: Referring Provider: Treating Provider/Extender: Nelva Bush in Treatment: 0 Verbal / Phone Orders: No Diagnosis Coding ICD-10 Coding Code Description E11.621 Type 2 diabetes mellitus with foot ulcer L97.522 Non-pressure chronic ulcer of other part of left foot with fat layer  exposed Follow-up Appointments Other: - Call us if you need a f/u appointment!!:) Look on [place] to see about cast protector to keep foot from getting wet. Wear socks with grip over the cast protector. Then your surgical shoe over that. Bathing/ Shower/ Hygiene May shower with protection but do not get wound dressing(s) wet. Edema Control - Lymphedema / SCD / Other Elevate legs to the level of  the heart or above for 30 minutes daily and/or when sitting, a frequency of: Avoid standing for long periods of time. Off-Loading Open toe surgical shoe to: - left foot Wound Treatment Wound #1 - T Second oe Wound Laterality: Left Cleanser: Wound Cleanser 1 x Per Day/30 Days Discharge Instructions: Cleanse the wound with wound cleanser prior to applying a clean dressing using gauze sponges, not tissue or cotton balls. Prim Dressing: MediHoney Gel, tube 1.5 (oz) 1 x Per Day/30 Days ary Discharge Instructions: Apply to wound bed as instructed Secondary Dressing: Bordered Gauze, 2x2 in 1 x Per Day/30 Days Discharge Instructions: Apply over primary dressing as directed. Secured With: bandaid 1 x Per Day/30 Days Electronic Signature(s) Signed: 02/18/2022 10:13:31 AM By: Kalman Shan DO Entered By: Kalman Shan on 02/18/2022 09:26:26 -------------------------------------------------------------------------------- Problem List Details Patient Name: Date of Service: BLA LO CK, [name]. 02/18/2022 8:00 A M Medical Record Number: [id number] Patient Account Number: [id number] Date of Birth/Sex: Treating RN: 1945/12/16 (76 y.o. F) Primary Care Provider: Garret Reddish Other Clinician: Referring Provider: Treating Provider/Extender: Nelva Bush in Treatment: 0 Active Problems ICD-10 Encounter Code Description Active Date MDM Diagnosis E11.621 Type 2 diabetes mellitus with foot ulcer 02/18/2022 No Yes L97.522 Non-pressure chronic ulcer of other part of left foot  with fat layer exposed 02/18/2022 No Yes M20.42 Other hammer toe(s) (acquired), left foot 02/18/2022 No Yes Inactive Problems Resolved Problems Electronic Signature(s) Signed: 02/18/2022 10:13:31 AM By: Kalman Shan DO Entered By: Kalman Shan on 02/18/2022 09:18:45 -------------------------------------------------------------------------------- Progress Note Details Patient Name: Date of Service: BLA LO CK, [name]. 02/18/2022 8:00 A M Medical Record Number: [id number] Patient Account Number: [id number] Date of Birth/Sex: Treating RN: 1945/09/14 (76 y.o. F) Primary Care Provider: Garret Reddish Other Clinician: Referring Provider: Treating Provider/Extender: Nelva Bush in Treatment: 0 Subjective Chief Complaint Information obtained from Patient 02/18/2022; left second toe wound History of Present Illness (HPI) 02/18/2022 Ms. Madelyn Tlatelpa is a 76 year old female with a past medical history of currently controlled type 2 diabetes complicated by peripheral neuropathy and emphysema that presents to the clinic for a wound to the left second hammertoe. She states that this started 1 month ago. It was discovered while she was doing physical therapy. She has been wearing closed toed shoes when the wound was discovered. She has been wearing a surgical open toed shoe for the past week. She has been using Vaseline to the wound bed. She has been following Dr. Amalia Hailey for this issue. Plan is for amputation. Patient will be going on vacation and returns in 1- 2 weeks. She has follow-up with Dr. Amalia Hailey on 9/13. Patient presents today for dressing recommendations. She currently denies signs of infection. Patient History Information obtained from Chart. Allergies fluoride (Reaction: nausea) Family History Cancer - Father, Diabetes - Maternal Grandparents, Lung Disease - Father, Stroke - Father. Social History Current every day smoker - 1ppd, Marital Status - Married,  Alcohol Use - Never, Drug Use - No History, Caffeine Use - Never. Medical History Eyes Patient has history of Cataracts - small Cardiovascular Patient has history of Coronary Artery Disease, Hypertension Endocrine Patient has history of Type II Diabetes Musculoskeletal Patient has history of Osteoarthritis Hospitalization/Surgery History - hysterectomy. - bladder surgery. Medical A Surgical History Notes nd Respiratory emphysema Cardiovascular hyperlipidemia Gastrointestinal diverticulosis Genitourinary UTIs right renal mass benign 2018 Immunological chicken pox 2015 Objective Constitutional respirations regular, non-labored and within target range for patient.. Vitals Time Taken: 8:14 AM, Height: 68  in, Source: Stated, Weight: 188 lbs, Source: Stated, BMI: 28.6, Temperature: 98.5 F, Pulse: 84 bpm, Respiratory Rate: 17 breaths/min, Blood Pressure: 139/79 mmHg, Capillary Blood Glucose: 112 mg/dl. Cardiovascular 2+ dorsalis pedis/posterior tibialis pulses. Psychiatric pleasant and cooperative. General Notes: Left foot: Second toe with hammertoe deformity. Open wound to the dorsal aspect that is undermining from the 3 to 9 o'clock position. Granulation tissue and nonviable tissue noted at the opening. No signs of surrounding soft tissue infection including increased warmth, erythema or drainage. Integumentary (Hair, Skin) Wound #1 status is Open. Original cause of wound was Gradually Appeared. The date acquired was: 02/01/2022. The wound is located on the Left T Second. oe The wound measures 0.3cm length x 0.5cm width x 0.3cm depth; 0.118cm^2 area and 0.035cm^3 volume. There is no tunneling noted, however, there is undermining starting at 12:00 and ending at 12:00 with a maximum distance of 0.5cm. There is a medium amount of serosanguineous drainage noted. The wound margin is distinct with the outline attached to the wound base. There is no granulation within the wound bed. There  is a large (67-100%) amount of necrotic tissue within the wound bed including Eschar and Adherent Slough. Assessment Active Problems ICD-10 Type 2 diabetes mellitus with foot ulcer Non-pressure chronic ulcer of other part of left foot with fat layer exposed Other hammer toe(s) (acquired), left foot Patient presents with a 1 month history of nonhealing ulcer to the second left toe complicated by peripheral neuropathy and hammertoe deformity. Patient declines debridement. She is following Dr. Amalia Hailey for this issue and presents today for dressing change recommendation. At this time I recommended Medihoney daily. She can continue her surgical open toed shoe. She plans on having the toe amputated but has close follow-up with Dr. Amalia Hailey on 9/13 to discuss this further. She may follow-up here as needed. Plan Follow-up Appointments: Other: - Call us if you need a f/u appointment!!:) Look on [employer] to see about cast protector to keep foot from getting wet. Wear socks with grip over the cast protector. Then your surgical shoe over that. Bathing/ Shower/ Hygiene: May shower with protection but do not get wound dressing(s) wet. Edema Control - Lymphedema / SCD / Other: Elevate legs to the level of the heart or above for 30 minutes daily and/or when sitting, a frequency of: Avoid standing for long periods of time. Off-Loading: Open toe surgical shoe to: - left foot WOUND #1: - T Second Wound Laterality: Left oe Cleanser: Wound Cleanser 1 x Per Day/30 Days Discharge Instructions: Cleanse the wound with wound cleanser prior to applying a clean dressing using gauze sponges, not tissue or cotton balls. Prim Dressing: MediHoney Gel, tube 1.5 (oz) 1 x Per Day/30 Days ary Discharge Instructions: Apply to wound bed as instructed Secondary Dressing: Bordered Gauze, 2x2 in 1 x Per Day/30 Days Discharge Instructions: Apply over primary dressing as directed. Secured With: bandaid 1 x Per Day/30 Days 1.  Medihoney 2. Surgical shoe, open toed 3. Follow-up as needed Electronic Signature(s) Signed: 02/18/2022 10:13:31 AM By: Kalman Shan DO Entered By: Kalman Shan on 02/18/2022 09:30:29 -------------------------------------------------------------------------------- HxROS Details Patient Name: Date of Service: BLA LO CK, [name]. 02/18/2022 8:00 A M Medical Record Number: [id number] Patient Account Number: [id number] Date of Birth/Sex: Treating RN: December 02, 1945 (76 y.o. Debby Bud Primary Care Provider: Garret Reddish Other Clinician: Referring Provider: Treating Provider/Extender: Nelva Bush in Treatment: 0 Information Obtained From Chart Eyes Medical History: Positive for: Cataracts - small Respiratory Medical History:  Past Medical History Notes: emphysema Cardiovascular Medical History: Positive for: Coronary Artery Disease; Hypertension Past Medical History Notes: hyperlipidemia Gastrointestinal Medical History: Past Medical History Notes: diverticulosis Endocrine Medical History: Positive for: Type II Diabetes Genitourinary Medical History: Past Medical History Notes: UTIs right renal mass benign 2018 Immunological Medical History: Past Medical History Notes: chicken pox 2015 Musculoskeletal Medical History: Positive for: Osteoarthritis HBO Extended History Items Eyes: Cataracts Immunizations Pneumococcal Vaccine: Received Pneumococcal Vaccination: No Implantable Devices No devices added Hospitalization / Surgery History Type of Hospitalization/Surgery hysterectomy bladder surgery Family and Social History Cancer: Yes - Father; Diabetes: Yes - Maternal Grandparents; Lung Disease: Yes - Father; Stroke: Yes - Father; Current every day smoker - 1ppd; Marital Status - Married; Alcohol Use: Never; Drug Use: No History; Caffeine Use: Never; Financial Concerns: No; Food, Clothing or Shelter Needs: No; Support System  Lacking: No; Transportation Concerns: No Electronic Signature(s) Signed: 02/18/2022 10:13:31 AM By: Kalman Shan DO Signed: 02/18/2022 5:13:32 PM By: Deon Pilling RN, BSN Signed: 02/22/2022 1:10:23 PM By: Rhae Hammock RN Entered By: Rhae Hammock on 02/18/2022 08:17:09 -------------------------------------------------------------------------------- SuperBill Details Patient Name: Date of Service: BLA LO CK, [name] 02/18/2022 Medical Record Number: [id number] Patient Account Number: [id number] Date of Birth/Sex: Treating RN: 06/27/1945 (76 y.o. [name], Lauren Primary Care Provider: Garret Reddish Other Clinician: Referring Provider: Treating Provider/Extender: Nelva Bush in Treatment: 0 Diagnosis Coding ICD-10 Codes Code Description [phone] Type 2 diabetes mellitus with foot ulcer L97.522 Non-pressure chronic ulcer of other part of left foot with fat layer exposed M20.42 Other hammer toe(s) (acquired), left foot Facility Procedures CPT4 Code: 08811031 Description: 99213 - WOUND CARE VISIT-LEV 3 EST PT Modifier: Quantity: 1 Physician Procedures : CPT4 Code Description Modifier [id number] - WC PHYS LEVEL 4 - NEW PT ICD-10 Diagnosis Description E11.621 Type 2 diabetes mellitus with foot ulcer L97.522 Non-pressure chronic ulcer of other part of left foot with fat layer exposed M20.42 Other  hammer toe(s) (acquired), left foot Quantity: 1 Electronic Signature(s) Signed: 02/18/2022 10:13:31 AM By: Kalman Shan DO Entered By: Kalman Shan on 02/18/2022 09:30:42

## 2022-02-22 NOTE — Progress Notes (Signed)
Samantha Snyder, Samantha Snyder ([id number]) Visit Report for 02/18/2022 Abuse Risk Screen Details Patient Name: Date of Service: Samantha LO [place]. 02/18/2022 8:00 A M Medical Record Number: [id number] Patient Account Number: [id number] Date of Birth/Sex: Treating RN: 08/15/1945 (76 y.o. [name], Lauren Primary Care Tavita Eastham: Garret Reddish Other Clinician: Referring Isidoro Santillana: Treating Ziyon Cedotal/Extender: Nelva Bush in Treatment: 0 Abuse Risk Screen Items Answer ABUSE RISK SCREEN: Has anyone close to you tried to hurt or harm you recentlyo No Do you feel uncomfortable with anyone in your familyo No Has anyone forced you do things that you didnt want to doo No Electronic Signature(s) Signed: 02/22/2022 1:10:23 PM By: Rhae Hammock RN Entered By: Rhae Hammock on 02/18/2022 08:17:17 -------------------------------------------------------------------------------- Activities of Daily Living Details Patient Name: Date of Service: Samantha Snyder, Samantha Aly. 02/18/2022 8:00 A M Medical Record Number: [id number] Patient Account Number: [id number] Date of Birth/Sex: Treating RN: [birth year] (76 y.o. [name], Lauren Primary Care Niccolas Loeper: Garret Reddish Other Clinician: Referring Lacreshia Bondarenko: Treating Alejandrina Raimer/Extender: Nelva Bush in Treatment: 0 Activities of Daily Living Items Answer Activities of Daily Living (Please select one for each item) Drive Automobile Completely Able T Medications ake Completely Able Use T elephone Completely Able Care for Appearance Completely Able Use T oilet Completely Able Bath / Shower Completely Able Dress Self Completely Able Feed Self Completely Able Walk Completely Able Get In / Out Bed Completely Able Housework Completely Able Prepare Meals Completely [place] Completely Able Shop for Self Completely Able Electronic Signature(s) Signed: 02/22/2022 1:10:23 PM By: Rhae Hammock RN Entered  By: Rhae Hammock on 02/18/2022 08:17:32 -------------------------------------------------------------------------------- Education Screening Details Patient Name: Date of Service: Samantha Snyder, Samantha Aly. 02/18/2022 8:00 A M Medical Record Number: [id number] Patient Account Number: [id number] Date of Birth/Sex: Treating RN: 1945/12/17 (76 y.o. [name], Lauren Primary Care Lulu Hirschmann: Garret Reddish Other Clinician: Referring Marion Seese: Treating Ledora Delker/Extender: Nelva Bush in Treatment: 0 Primary Learner Assessed: Patient Learning Preferences/Education Level/Primary Language Learning Preference: Explanation, Demonstration, Communication Board, Printed Material Highest Education Level: College or Above Preferred Language: English Cognitive Barrier Language Barrier: No Translator Needed: No Memory Deficit: No Emotional Barrier: No Cultural/Religious Beliefs Affecting Medical Care: No Physical Barrier Impaired Vision: Yes Glasses Impaired Hearing: No Decreased Hand dexterity: No Knowledge/Comprehension Knowledge Level: High Comprehension Level: High Ability to understand written instructions: High Ability to understand verbal instructions: High Motivation Anxiety Level: Calm Cooperation: Cooperative Education Importance: Denies Need Interest in Health Problems: Asks Questions Perception: Coherent Willingness to Engage in Self-Management High Activities: Readiness to Engage in Self-Management High Activities: Electronic Signature(s) Signed: 02/22/2022 1:10:23 PM By: Rhae Hammock RN Entered By: Rhae Hammock on 02/18/2022 08:18:09 -------------------------------------------------------------------------------- Fall Risk Assessment Details Patient Name: Date of Service: Samantha Snyder, Samantha Aly. 02/18/2022 8:00 A M Medical Record Number: [id number] Patient Account Number: [id number] Date of Birth/Sex: Treating RN: 1945/09/02 (76 y.o. [name], Lauren Primary Care Chiamaka Latka: Garret Reddish Other Clinician: Referring Daana Petrasek: Treating Zaidan Keeble/Extender: Nelva Bush in Treatment: 0 Fall Risk Assessment Items Have you had 2 or more falls in the last 12 monthso 0 No Have you had any fall that resulted in injury in the last 12 monthso 0 Yes FALLS RISK SCREEN History of falling - immediate or within 3 months 0 No Secondary diagnosis (Do you have 2 or more medical diagnoseso) 0 No Ambulatory aid None/bed rest/wheelchair/nurse 0 No Crutches/cane/walker 0 No Furniture 0 No Intravenous therapy Access/Saline/Heparin [employer]  0 No Gait/Transferring Normal/ bed rest/ wheelchair 0 No Weak (short steps with or without shuffle, stooped but able to lift head while walking, may seek 0 No support from furniture) Impaired (short steps with shuffle, may have difficulty arising from chair, head down, impaired 0 No balance) Mental Status Oriented to own ability 0 No Electronic Signature(s) Signed: 02/22/2022 1:10:23 PM By: Rhae Hammock RN Entered By: Rhae Hammock on 02/18/2022 08:18:24 -------------------------------------------------------------------------------- Foot Assessment Details Patient Name: Date of Service: Samantha Snyder, Samantha Aly. 02/18/2022 8:00 A M Medical Record Number: [id number] Patient Account Number: [id number] Date of Birth/Sex: Treating RN: 03/07/1946 (76 y.o. [name], Lauren Primary Care Jesus Nevills: Garret Reddish Other Clinician: Referring Marcelis Wissner: Treating Shaily Librizzi/Extender: Nelva Bush in Treatment: 0 Foot Assessment Items Site Locations + = Sensation present, - = Sensation absent, C = Callus, U = Ulcer R = Redness, W = Warmth, M = Maceration, PU = Pre-ulcerative lesion F = Fissure, S = Swelling, D = Dryness Assessment Right: Left: Other Deformity: No No Prior Foot Ulcer: No No Prior Amputation: No No Charcot Joint: No No Ambulatory  Status: Ambulatory Without Help Gait: Steady Electronic Signature(s) Signed: 02/22/2022 1:10:23 PM By: Rhae Hammock RN Entered By: Rhae Hammock on 02/18/2022 08:33:49 -------------------------------------------------------------------------------- Nutrition Risk Screening Details Patient Name: Date of Service: Samantha Snyder, Samantha Snyder. 02/18/2022 8:00 A M Medical Record Number: [id number] Patient Account Number: [id number] Date of Birth/Sex: Treating RN: 1945-12-10 (76 y.o. [name], Lauren Primary Care Malayia Spizzirri: Garret Reddish Other Clinician: Referring Clinten Howk: Treating Avianna Moynahan/Extender: Nelva Bush in Treatment: 0 Height (in): 68 Weight (lbs): 188 Body Mass Index (BMI): 28.6 Nutrition Risk Screening Items Score Screening NUTRITION RISK SCREEN: I have an illness or condition that made me change the kind and/or amount of food I eat 0 No I eat fewer than two meals per day 0 No I eat few fruits and vegetables, or milk products 0 No I have three or more drinks of beer, liquor or wine almost every day 0 No I have tooth or mouth problems that make it hard for me to eat 0 No I don't always have enough money to buy the food I need 0 No I eat alone most of the time 0 No I take three or more different prescribed or over-the-counter drugs a day 0 No Without wanting to, I have lost or gained 10 pounds in the last six months 0 No I am not always physically able to shop, cook and/or feed myself 0 No Nutrition Protocols Good Risk Protocol 0 No interventions needed Moderate Risk Protocol High Risk Proctocol Risk Level: Good Risk Score: 0 Electronic Signature(s) Signed: 02/22/2022 1:10:23 PM By: Rhae Hammock RN Entered By: Rhae Hammock on 02/18/2022 08:18:31

## 2022-03-06 ENCOUNTER — Telehealth: Payer: Self-pay

## 2022-03-06 ENCOUNTER — Telehealth: Payer: Self-pay | Admitting: Urology

## 2022-03-06 ENCOUNTER — Ambulatory Visit: Payer: Medicare PPO | Admitting: Podiatry

## 2022-03-06 DIAGNOSIS — M2042 Other hammer toe(s) (acquired), left foot: Secondary | ICD-10-CM

## 2022-03-06 NOTE — Telephone Encounter (Signed)
Samantha Snyder is scheduled for surgery with Dr. Amalia Hailey on 03/28/2022. She is on Ozempic, she was told to stop Ozempic 7 days prior to surgery. She stated she understood.

## 2022-03-06 NOTE — Telephone Encounter (Signed)
DOS - 03/28/22  The following codes do not require a pre-authorization Created on 03/06/2022  Plan Year 06/25/2019 - 06/23/9998  Service info [phone] Amputation, toe; metatarsophalangeal joint

## 2022-03-06 NOTE — Progress Notes (Signed)
Chief Complaint  Patient presents with   Hammer Toe    Hammertoe of left foot patient states that she is here to discuss amputation with the provider.    HPI: 76 y.o. female presenting today for follow-up evaluation of symptomatic hammertoe to the second digit left foot.  Patient states that she would like to proceed with surgical amputation of the toe.  Her and her husband have both discussed and thought about proceeding with the surgery while [place] were at the beach and they both agree that amputation of the toe would alleviate her symptoms and allow the fastest path to recovery with potentially less possible complications.  The second hammertoe is very painful and she gets recurrent ulcers overlying the dorsal aspect of the toe.  It is painful in all shoe gear.  She presents for further treatment and evaluation  Past Medical History:  Diagnosis Date   Allergy    minor hay fever   Arthritis    hands x 2, knee    Cataract    small   Chicken pox 02/24/2014   Has had shingles vaccine   Chronic diarrhea    For one year-needed prolonged course of antibiotics   Diabetes mellitus without complication ([place])    Diverticulosis 02/24/2014   Emphysema of lung (HCC)    mild per pt. per x ray    History of UTI 2014   per pt noted after taking Bactrim due to tooth infection   Hyperlipidemia    Neuromuscular disorder (HCC)    neuropathy both feet    Osteopenia    Right adrenal mass ([place])    Biopsied in 2008 and benign   Vaginal prolapse    Status post surgery. Improved urinary incontinence    Past Surgical History:  Procedure Laterality Date   ABDOMINAL HYSTERECTOMY  09/07/2013   ABDOMINAL SACROCOLPOPEXY  09/07/2013   BLADDER SURGERY  09/07/2013   vaginal prolapse   BREAST BIOPSY Right    2014? near nipple. benign.   COLONOSCOPY  11/06/2005   sig tics, no polyps-[facility] [place]    CYSTOSCOPY  09/07/2013   ENTEROCELE REPAIR  09/07/2013   INDUCED ABORTION  1985   Therapeutic    LAPAROSCOPIC BILATERAL SALPINGO OOPHERECTOMY  09/07/2013   POLYPECTOMY     PUBOVAGINAL SLING  09/07/2013   TONSILLECTOMY Bilateral 1955   TUBAL LIGATION      Allergies  Allergen Reactions   Fluoride Preparations Nausea Only    Patient states it burned her mouth and she had abdominal pain for 24-36 hours (dental office)        Physical Exam: General: The patient is alert and oriented x3 in no acute distress.  Dermatology: Ulcer noted overlying the PIPJ of the second digit left foot with fat layer exposed.  Currently there is no exposed bone.  Serous drainage.  It measures about 1 cm in diameter.  Vascular: Palpable pedal pulses bilaterally. Capillary refill within normal limits.  Negative for any significant edema or erythema  Neurological: Light touch and protective threshold diminished  Musculoskeletal Exam: Hallux valgus deformity noted bilateral.  Asymptomatic with palpation and range of motion.  She does have a significant amount of pain and tenderness associated to the second digit hammertoe left.  Hammertoe deformity contributing to the overlying ulcer which rubs in her shoes  Radiographic Exam LT foot 02/13/2022:  Hallux valgus deformity noted with an increased intermetatarsal angle and hallux abductus angle with crossover deformity of the second digit.  Diffuse degenerative  changes also noted throughout both foot.  No acute fractures identified.  No osseous erosions or cortical irregularities that would be concerning for osteomyelitis  Assessment: 1.  Symptomatic hammertoe second left with overlying ulcer 2.  Asymptomatic hallux valgus deformity bilateral   Plan of Care:  1. Patient evaluated. X-Rays reviewed.  2.  Today we discussed the procedure again of second toe amputation to the left foot versus bunionectomy with hammertoe repair.  The patient would like to opt for simple amputation of the second digit.  Risk benefits advantages and disadvantages were all explained  in detail.  No guarantees were expressed or implied.  All patient questions answered. 3.  Authorization for surgery was initiated today.  Surgery will consist of second toe amputation left foot 4.  Return to clinic 1 week postop     Edrick Kins, DPM [facility]  Dr. Edrick Kins, DPM    [address], [place] [address]                Office [phone]  Fax [phone]

## 2022-03-07 ENCOUNTER — Other Ambulatory Visit: Payer: Self-pay | Admitting: Family Medicine

## 2022-03-07 DIAGNOSIS — Z1231 Encounter for screening mammogram for malignant neoplasm of breast: Secondary | ICD-10-CM

## 2022-03-13 ENCOUNTER — Encounter: Payer: Self-pay | Admitting: Family Medicine

## 2022-03-18 ENCOUNTER — Encounter: Payer: Self-pay | Admitting: *Deleted

## 2022-03-20 ENCOUNTER — Encounter: Payer: Self-pay | Admitting: Family Medicine

## 2022-03-28 ENCOUNTER — Other Ambulatory Visit: Payer: Self-pay | Admitting: Podiatry

## 2022-03-28 DIAGNOSIS — M79672 Pain in left foot: Secondary | ICD-10-CM | POA: Diagnosis not present

## 2022-03-28 DIAGNOSIS — M2042 Other hammer toe(s) (acquired), left foot: Secondary | ICD-10-CM | POA: Diagnosis not present

## 2022-03-28 MED ORDER — OXYCODONE-ACETAMINOPHEN 5-325 MG PO TABS
1.0000 | ORAL_TABLET | ORAL | 0 refills | Status: DC | PRN
Start: 2022-03-28 — End: 2022-04-08

## 2022-03-28 NOTE — Progress Notes (Signed)
PRN POSTOP

## 2022-04-03 ENCOUNTER — Ambulatory Visit (INDEPENDENT_AMBULATORY_CARE_PROVIDER_SITE_OTHER): Payer: Medicare PPO

## 2022-04-03 ENCOUNTER — Ambulatory Visit (INDEPENDENT_AMBULATORY_CARE_PROVIDER_SITE_OTHER): Payer: Medicare PPO | Admitting: Podiatry

## 2022-04-03 DIAGNOSIS — Z9889 Other specified postprocedural states: Secondary | ICD-10-CM | POA: Diagnosis not present

## 2022-04-03 NOTE — Progress Notes (Signed)
Chief Complaint  Patient presents with   Post-op Follow-up    POV #1 DOS 03/28/2022 AMPUTATION LT 2ND TOE, patient states that her left foot has some pain she is taking tylenol every 6 hours for pain, patient is requesting that er toe nails be trimmed today.    Subjective:  Patient presents today status post left second toe amputation. DOS: 03/28/2022.  Patient states that she is doing much better.  Pain is tolerable.  She only took a few of the pain medications.  She has kept the dressings clean dry and intact as instructed.  Minimal weightbearing in the surgical shoe.  No new complaints at this time  Past Medical History:  Diagnosis Date   Allergy    minor hay fever   Arthritis    hands x 2, knee    Cataract    small   Chicken pox 02/24/2014   Has had shingles vaccine   Chronic diarrhea    For one year-needed prolonged course of antibiotics   Diabetes mellitus without complication ([place])    Diverticulosis 02/24/2014   Emphysema of lung (HCC)    mild per pt. per x ray    History of UTI 2014   per pt noted after taking Bactrim due to tooth infection   Hyperlipidemia    Neuromuscular disorder (HCC)    neuropathy both feet    Osteopenia    Right adrenal mass ([place])    Biopsied in 2008 and benign   Vaginal prolapse    Status post surgery. Improved urinary incontinence    Past Surgical History:  Procedure Laterality Date   ABDOMINAL HYSTERECTOMY  09/07/2013   ABDOMINAL SACROCOLPOPEXY  09/07/2013   BLADDER SURGERY  09/07/2013   vaginal prolapse   BREAST BIOPSY Right    2014? near nipple. benign.   COLONOSCOPY  11/06/2005   sig tics, no polyps-[facility] [place]    CYSTOSCOPY  09/07/2013   ENTEROCELE REPAIR  09/07/2013   INDUCED ABORTION  1985   Therapeutic   LAPAROSCOPIC BILATERAL SALPINGO OOPHERECTOMY  09/07/2013   POLYPECTOMY     PUBOVAGINAL SLING  09/07/2013   TONSILLECTOMY Bilateral 1955   TUBAL LIGATION      Allergies  Allergen Reactions   Fluoride Preparations  Nausea Only    Patient states it burned her mouth and she had abdominal pain for 24-36 hours (dental office)    Objective/Physical Exam Neurovascular status intact.  Skin incisions appear to be well coapted with sutures intact. No sign of infectious process noted. No dehiscence. No active bleeding noted. Moderate edema noted to the surgical extremity.  Radiographic Exam LT foot 04/03/2022:  Absence of the second toe noted at the level of the MTP left foot.  No cortical irregularities or concern.  Assessment: 1. s/p second toe amputation LT foot. DOS: 03/28/2022   Plan of Care:  1. Patient was evaluated. X-rays reviewed 2.  Dressings changed.  Clean dry and intact x1 week 3.  Continue weightbearing in the postsurgical shoe 4.  Return to clinic 1 week   Edrick Kins, DPM [facility]  Dr. Edrick Kins, DPM    [address], [place] [address]                Office [phone]  Fax [phone]

## 2022-04-08 ENCOUNTER — Ambulatory Visit (INDEPENDENT_AMBULATORY_CARE_PROVIDER_SITE_OTHER): Payer: Medicare PPO

## 2022-04-08 VITALS — Wt 188.0 lb

## 2022-04-08 DIAGNOSIS — Z0000 Encounter for general adult medical examination without abnormal findings: Secondary | ICD-10-CM

## 2022-04-08 NOTE — Progress Notes (Signed)
I connected with  Samantha Snyder on 04/08/22 by a audio enabled telemedicine application and verified that I am speaking with the correct person using two identifiers.  Patient Location: Home  Provider Location: Office/Clinic  I discussed the limitations of evaluation and management by telemedicine. The patient expressed understanding and agreed to proceed.   Subjective:   Samantha Snyder is a 76 y.o. female who presents for Medicare Annual (Subsequent) preventive examination.  Review of Systems     Cardiac Risk Factors include: advanced age (>63men, >40 women);diabetes mellitus;hypertension;dyslipidemia     Objective:    Today's Vitals   04/08/22 1327  Weight: 188 lb (85.3 kg)   Body mass index is 27.76 kg/m.     04/08/2022    1:32 PM 10/16/2021   11:00 AM 10/05/2021    8:18 PM 10/02/2021   10:34 PM 03/26/2021    1:07 PM 03/16/2020    9:52 AM 03/15/2019   10:14 AM  Advanced Directives  Does Patient Have a Medical Advance Directive? Yes Yes Yes No;Yes Yes Yes Yes  Type of Paramedic of [place];Living will [place];Living will Living will;Healthcare Power of [place];Living will Living will;Healthcare Power of Attorney  Does patient want to make changes to medical advance directive? No - Patient declined  No - Patient declined    No - Patient declined  Copy of [place] in Chart? Yes - validated most recent copy scanned in chart (See row information)    Yes - validated most recent copy scanned in chart (See row information) Yes - validated most recent copy scanned in chart (See row information) Yes - validated most recent copy scanned in chart (See row information)    Current Medications (verified) Outpatient Encounter Medications as of 04/08/2022  Medication Sig   acetaminophen (TYLENOL) 500 MG tablet Take 500 mg by mouth in the morning and at bedtime.    Alcohol Swabs (B-D SINGLE USE SWABS REGULAR) PADS Use to cleanse are before checking blood sugar. Dx: E11.9   Alcohol Swabs PADS Use to clean area before checking blood sugars.   alendronate (FOSAMAX) 70 MG tablet TAKE 1 TABLET BY MOUTH EVERY 7 DAYS. TAKE WITH A FULL GLASS OF WATER ON AN EMPTY STOMACH. (Patient taking differently: Take 70 mg by mouth every Sunday.)   Alpha-Lipoic Acid 600 MG CAPS TAKE 1 CAPSULE (600 MG TOTAL) BY MOUTH DAILY.   atorvastatin (LIPITOR) 20 MG tablet TAKE 1 TABLET EVERY DAY   Blood Glucose Monitoring Suppl (TRUE METRIX AIR GLUCOSE METER) DEVI Use to test blood sugars daily. Dx: E11.9   Calcium Carbonate (CALCIUM 600 PO) Take 1,200 mg by mouth at bedtime.   Cholecalciferol (VITAMIN D3) 2000 UNITS TABS Take 2,000 Units by mouth at bedtime.   diclofenac Sodium (VOLTAREN) 1 % GEL Apply 2 g topically 4 (four) times daily as needed (to painful sites of feet and hands).   fexofenadine (ALLEGRA) 180 MG tablet Take 180 mg by mouth daily.   lisinopril (ZESTRIL) 5 MG tablet TAKE 1 TABLET EVERY DAY   Magnesium 500 MG CAPS Take 500 mg by mouth at bedtime.   metFORMIN (GLUCOPHAGE) 1000 MG tablet TAKE 1 TABLET TWICE DAILY WITH MEALS   Multiple Vitamins-Minerals (HAIR SKIN AND NAILS FORMULA PO) Take 2 tablets by mouth daily.   Multiple Vitamins-Minerals (MULTIVITAMIN PO) Take 1 tablet by mouth at bedtime.   Omega-3 Fatty Acids (FISH OIL PO) Take  2,000 mg by mouth in the morning and at bedtime.   OVER THE COUNTER MEDICATION Take 240 mLs by mouth See admin instructions. Herbal tea- Drink 8 ounces by mouth daily   OZEMPIC, 0.25 OR 0.5 MG/DOSE, 2 MG/3ML SOPN INJECT 0.5MG AS DIRECTED ONCE WEEKLY (Patient taking differently: Inject 0.5 mg into the skin every Wednesday.)   Probiotic Product (PROBIOTIC DAILY PO) Take 1 capsule by mouth every evening.   TRUE METRIX BLOOD GLUCOSE TEST test strip TEST BLOOD SUGAR  UP  TO FOUR TIMES DAILY   TRUEplus Lancets 33G MISC TEST BLOOD SUGAR  UP  TO  FOUR TIMES DAILY   [DISCONTINUED] oxyCODONE-acetaminophen (PERCOCET) 5-325 MG tablet Take 1 tablet by mouth every 4 (four) hours as needed for severe pain.   No facility-administered encounter medications on file as of 04/08/2022.    Allergies (verified) Fluoride preparations   History: Past Medical History:  Diagnosis Date   Allergy    minor hay fever   Arthritis    hands x 2, knee    Cataract    small   Chicken pox 02/24/2014   Has had shingles vaccine   Chronic diarrhea    For one year-needed prolonged course of antibiotics   Diabetes mellitus without complication ([place])    Diverticulosis 02/24/2014   Emphysema of lung (HCC)    mild per pt. 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 Improved urinary incontinence   Past Surgical History:  Procedure Laterality Date   ABDOMINAL HYSTERECTOMY  09/07/2013   ABDOMINAL SACROCOLPOPEXY  09/07/2013   BLADDER SURGERY  09/07/2013   vaginal prolapse   BREAST BIOPSY Right    2014? near nipple. benign.   COLONOSCOPY  11/06/2005   sig tics, no polyps-[facility] [place]    CYSTOSCOPY  09/07/2013   ENTEROCELE REPAIR  09/07/2013   INDUCED ABORTION  1985   Therapeutic   LAPAROSCOPIC BILATERAL SALPINGO OOPHERECTOMY  09/07/2013   POLYPECTOMY     PUBOVAGINAL SLING  09/07/2013   TONSILLECTOMY Bilateral 1955   TUBAL LIGATION     Family History  Problem Relation Age of Onset   Cancer Father        Lung but was a smoker   Stroke Father        70 massive lead to death   Diabetes Father    Bladder Cancer Father    Lung cancer Father    Osteoporosis Mother    Anuerysm Mother    Diabetes Maternal Grandmother    Colon cancer Neg Hx    Colon polyps Neg Hx    Esophageal cancer Neg Hx    Rectal  cancer Neg Hx    Stomach cancer Neg Hx    Breast cancer Neg Hx    Social History   Socioeconomic History   Marital status: Married    Spouse name: Not on file   Number of children: Not on file   Years of education: Not on file   Highest education level: Not on file  Occupational History   Occupation: retired  Tobacco Use   Smoking status: Every Day    Packs/day: 1.00    Years: 53.00  Total pack years: 53.00    Types: Cigarettes   Smokeless tobacco: Never   Tobacco comments:    Counseled that the single most powerful action she could take to decrease her risk of lung cancer is to quit smoking  Substance and Sexual Activity   Alcohol use: No    Alcohol/week: 0.0 standard drinks of alcohol   Drug use: No   Sexual activity: Not on file  Other Topics Concern   Not on file  Social History Narrative   Married 45 years in 2015.  Moved to [place] to be near daughter who lives here. Has a son as well and 36 year old grandson. 3 granddogs. One dog at home       Retired former  Academic librarian. Has a BS in biology from [employer]. Used to be an algologist-studied algae under microscope      Hobbies-exercising with her husband      Social Determinants of Health   Financial Resource Strain: Low Risk  (04/08/2022)   Overall Financial Resource Strain (CARDIA)    Difficulty of Paying Living Expenses: Not hard at all  Food Insecurity: No Food Insecurity (04/08/2022)   Hunger Vital Sign    Worried About Running Out of Food in the Last Year: Never true    Ran Out of Food in the Last Year: Never true  Transportation Needs: No Transportation Needs (04/08/2022)   PRAPARE - Hydrologist (Medical): No    Lack of Transportation (Non-Medical): No  Physical Activity: Inactive (04/08/2022)   Exercise Vital Sign    Days of Exercise per Week: 0 days    Minutes of Exercise per Session: 0 min  Stress: No Stress Concern Present (04/08/2022)   [place]    Feeling of Stress : Not at all  Social Connections: Moderately Isolated (04/08/2022)   Social Connection and Isolation Panel [NHANES]    Frequency of Communication with Friends and Family: More than three times a week    Frequency of Social Gatherings with Friends and Family: More than three times a week    Attends Religious Services: Never    Marine scientist or Organizations: No    Attends Music therapist: Never    Marital Status: Married    Tobacco Counseling Ready to quit: Not Answered Counseling given: Not Answered Tobacco comments: Counseled that the single most powerful action she could take to decrease her risk of lung cancer is to quit smoking   Clinical Intake:  Pre-visit preparation completed: Yes  Pain : No/denies pain     BMI - recorded: 27.76 Nutritional Status: BMI 25 -29 Overweight Nutritional Risks: None Diabetes: Yes CBG done?: Yes (115 per pt a.m) CBG resulted in Enter/ Edit results?: No Did pt. bring in CBG monitor from home?: No  How often do you need to have someone help you when you read instructions, pamphlets, or other written materials from your doctor or pharmacy?: 1 - Never  Diabetic?Nutrition Risk Assessment:  Has the patient had any N/V/D within the last 2 months?  No  Does the patient have any non-healing wounds?  No  Has the patient had any unintentional weight loss or weight gain?  No   Diabetes:  Is the patient diabetic?  Yes  If diabetic, was a CBG obtained today?  Yes  Did the patient bring in their glucometer from home?  No  How often do you monitor your  CBG's? Daily.   Financial Strains and Diabetes Management:  Are you having any financial strains with the device, your supplies or your medication? No .  Does the patient want to be seen by Chronic Care Management for management of their diabetes?  No  Would the patient like to be  referred to a Nutritionist or for Diabetic Management?  No   Diabetic Exams:  Diabetic Eye Exam: Completed 06/07/21 Diabetic Foot Exam: Completed 08/03/21   Interpreter Needed?: No  Information entered by :: Charlott Rakes, LPN   Activities of Daily Living    04/08/2022    1:37 PM 10/05/2021    8:18 PM  In your present state of health, do you have any difficulty performing the following activities:  Hearing? 0 0  Vision? 0 0  Difficulty concentrating or making decisions? 0 0  Walking or climbing stairs? 0 1  Dressing or bathing? 0 0  Doing errands, shopping? 0 1  Preparing Food and eating ? N   Using the Toilet? N   In the past six months, have you accidently leaked urine? N   Do you have problems with loss of bowel control? N   Managing your Medications? N   Managing your Finances? N   Housekeeping or managing your Housekeeping? N     Patient Care Team: Marin Olp, MD as PCP - General (Family Medicine) Celestia Khat, [place] as Consulting Physician (Optometry) Gerda Diss, DO as Consulting Physician (Sports Medicine)  Indicate any recent Medical Services you may have received from other than [facility] providers in the past year (date may be approximate).     Assessment:   This is a routine wellness examination for Yer.  Hearing/Vision screen Hearing Screening - Comments:: Pt denies any hearing issues  Vision Screening - Comments:: [facility]   Dietary issues and exercise activities discussed: Current Exercise Habits: The patient does not participate in regular exercise at present   Goals Addressed             This Visit's Progress    Patient Stated       To stop smoking        Depression Screen    04/08/2022    1:35 PM 03/30/2021    8:16 AM 03/26/2021    1:06 PM 09/19/2020    7:59 AM 03/16/2020    9:51 AM 07/19/2019   11:20 AM 03/15/2019    8:24 AM  PHQ 2/9 Scores  PHQ - 2 Score 0 0 0 0 0 0 0    Fall  Risk    04/08/2022    1:30 PM 03/30/2021    8:16 AM 03/26/2021    1:08 PM 09/19/2020    7:58 AM 03/16/2020    9:53 AM  Fall Risk   Falls in the past year? _0 0  Number falls in past yr: 1 0 1 0 0  Injury with Fall? _1 0  Comment hematoma from fall was seen in ER  right ring finger sprain    Risk for fall due to : Impaired vision History of fall(s) Impaired vision;Impaired balance/gait  Impaired vision;Impaired mobility;Impaired balance/gait  Risk for fall due to: Comment     related to neuropathy at time  Follow up Falls prevention discussed  Falls prevention discussed  Falls prevention discussed    FALL RISK PREVENTION PERTAINING TO THE HOME:  Any stairs in or around the home? No  If so, are there any without handrails? No  Home free of loose throw rugs in walkways, pet beds, electrical cords, etc? Yes  Adequate lighting in your home to reduce risk of falls? Yes   ASSISTIVE DEVICES UTILIZED TO PREVENT FALLS:  Life alert? Yes apple smart watch  Use of a cane, walker or w/c? No  Grab bars in the bathroom? Yes  Shower chair or bench in shower? No  Elevated toilet seat or a handicapped toilet? No   TIMED UP AND GO:  Was the test performed? No .   Cognitive Function:        04/08/2022    1:37 PM 03/26/2021    1:12 PM 03/16/2020    9:58 AM 03/15/2019   10:15 AM  6CIT Screen  What Year? 0 points 0 points 0 points 0 points  What month? 0 points 0 points 0 points 0 points  What time? 0 points 0 points  0 points  Count back from 20 0 points 0 points 0 points 0 points  Months in reverse 0 points 0 points 0 points 0 points  Repeat phrase 0 points 0 points 0 points 0 points  Total Score 0 points 0 points  0 points    Immunizations Immunization History  Administered Date(s) Administered   Fluad Quad(high Dose 65+) 03/15/2019, 03/20/2020, 03/30/2021   Hepatitis A 02/28/1948   Hepatitis B 03/28/1947   Influenza, High Dose Seasonal PF 03/17/2014, 03/07/2016,  03/11/2017, 03/12/2018   Influenza,inj,Quad PF,6+ Mos 03/02/2015   Influenza-Unspecified 03/10/2010, 02/25/2012, 03/17/2013   MMR 05/14/1990, 05/14/1992   Moderna Covid-19 Vaccine Bivalent Booster 69yrs & up 04/05/2022   PFIZER(Purple Top)SARS-COV-2 Vaccination 08/15/2019, 09/08/2019, 04/23/2020, 10/25/2020, 11/02/2021   Pfizer Covid-19 Vaccine Bivalent Booster 56yrs & up 03/06/2021   Pneumococcal Conjugate-13 03/17/2013, 06/07/2013   Pneumococcal Polysaccharide-23 09/19/2005, 02/24/2014   Pneumococcal-Unspecified 09/19/2005   Td 02/28/1950, 02/29/1956, 09/19/2005, 03/07/2016   Tdap 02/29/1960   Varicella 10/19/1991   Zoster Recombinat (Shingrix) 10/28/2016, 01/30/2017   Zoster, Live 02/28/2009    TDAP status: Up to date  Flu Vaccine status: Due, Education has been provided regarding the importance of this vaccine. Advised may receive this vaccine at local pharmacy or Health Dept. Aware to provide a copy of the vaccination record if obtained from local pharmacy or Health Dept. Verbalized acceptance and understanding.  Pneumococcal vaccine status: Up to date  Covid-19 vaccine status: Completed vaccines  Qualifies for Shingles Vaccine? Yes   Zostavax completed Yes   Shingrix Completed?: Yes  Screening Tests Health Maintenance  Topic Date Due   INFLUENZA VACCINE  01/22/2022   COVID-19 Vaccine (8 - Pfizer risk series) 05/31/2022   OPHTHALMOLOGY EXAM  06/07/2022   FOOT EXAM  08/03/2022   HEMOGLOBIN A1C  08/04/2022   Diabetic kidney evaluation - GFR measurement  02/02/2023   Diabetic kidney evaluation - Urine ACR  02/02/2023   MAMMOGRAM  04/21/2023   COLONOSCOPY (Pts 45-35yrs Insurance coverage will need to be confirmed)  01/21/2024   TETANUS/TDAP  03/07/2026   Pneumonia Vaccine 58+ Years old  Completed   DEXA SCAN  Completed   Hepatitis C Screening  Completed   Zoster Vaccines- Shingrix  Completed   HPV VACCINES  Aged Out    Health Maintenance  Health Maintenance Due   Topic Date Due   INFLUENZA VACCINE  01/22/2022    Colorectal cancer screening: Type of screening: Colonoscopy. Completed 01/21/19. Repeat every 5 years  Mammogram status: Completed 04/20/21. Repeat every year  Bone Density status: Completed  10/26/20. Results reflect: Bone density results: OSTEOPOROSIS. Repeat every as directed  years.   Additional Screening:  Hepatitis C Screening:  Completed 03/02/15  Vision Screening: Recommended annual ophthalmology exams for early detection of glaucoma and other disorders of the eye. Is the patient up to date with their annual eye exam?  Yes  Who is the provider or what is the name of the office in which the patient attends annual eye exams? [facility]  If pt is not established with a provider, would they like to be referred to a provider to establish care? No .   Dental Screening: Recommended annual dental exams for proper oral hygiene  Community Resource Referral / Chronic Care Management: CRR required this visit?  No   CCM required this visit?  No      Plan:     I have personally reviewed and noted the following in the patient's chart:   Medical and social history Use of alcohol, tobacco or illicit drugs  Current medications and supplements including opioid prescriptions. Patient is not currently taking opioid prescriptions. Functional ability and status Nutritional status Physical activity Advanced directives List of other physicians Hospitalizations, surgeries, and ER visits in previous 12 months Vitals Screenings to include cognitive, depression, and falls Referrals and appointments  In addition, I have reviewed and discussed with patient certain preventive protocols, quality metrics, and best practice recommendations. A written personalized care plan for preventive services as well as general preventive health recommendations were provided to patient.     Willette Brace, LPN   [id number]   Nurse Notes:  none

## 2022-04-08 NOTE — Patient Instructions (Signed)
Samantha Snyder , Thank you for taking time to come for your Medicare Wellness Visit. I appreciate your ongoing commitment to your health goals. Please review the following plan we discussed and let me know if I can assist you in the future.   These are the goals we discussed:  Goals      Patient Stated     Do more exercise     Patient Stated     To start exercise and lose weight     Patient Stated     To stop smoking      Quit Smoking     Is trying to quit smoking Evaluating different program      Quit smoking / using tobacco     Smoking;  Educated to avoid secondary smoke  Classes offered a couple of times a month;  Will work with the patient as far as registration and location  Meds may help; chatix (Varenicline); Zyban (Bupropion SR); Nicotine Replacement (gum; lozenges; patches; etc.)   30 pack yr smoking hx: Educated regarding LDCT; To discuss with MD at next fup. Also educated on AAA screening for men 65-75 who have smoked  Try to start thinking of your without cigarettes  I can quit           This is a list of the screening recommended for you and due dates:  Health Maintenance  Topic Date Due   Flu Shot  01/22/2022   COVID-19 Vaccine (8 - Pfizer risk series) 05/31/2022   Eye exam for diabetics  06/07/2022   Complete foot exam   08/03/2022   Hemoglobin A1C  08/04/2022   Yearly kidney function blood test for diabetes  02/02/2023   Yearly kidney health urinalysis for diabetes  02/02/2023   Mammogram  04/21/2023   Colon Cancer Screening  01/21/2024   Tetanus Vaccine  03/07/2026   Pneumonia Vaccine  Completed   DEXA scan (bone density measurement)  Completed   Hepatitis C Screening: USPSTF Recommendation to screen - Ages 35-79 yo.  Completed   Zoster (Shingles) Vaccine  Completed   HPV Vaccine  Aged Out    Advanced directives: copies in chart   Conditions/risks identified: stop smoking   Next appointment: Follow up in one year for your annual wellness  visit    Preventive Care 65 Years and Older, Female Preventive care refers to lifestyle choices and visits with your health care provider that can promote health and wellness. What does preventive care include? A yearly physical exam. This is also called an annual well check. Dental exams once or twice a year. Routine eye exams. Ask your health care provider how often you should have your eyes checked. Personal lifestyle choices, including: Daily care of your teeth and gums. Regular physical activity. Eating a healthy diet. Avoiding tobacco and drug use. Limiting alcohol use. Practicing safe sex. Taking low-dose aspirin every day. Taking vitamin and mineral supplements as recommended by your health care provider. What happens during an annual well check? The services and screenings done by your health care provider during your annual well check will depend on your age, overall health, lifestyle risk factors, and family history of disease. Counseling  Your health care provider may ask you questions about your: Alcohol use. Tobacco use. Drug use. Emotional well-being. Home and relationship well-being. Sexual activity. Eating habits. History of falls. Memory and ability to understand (cognition). Work and work Statistician. Reproductive health. Screening  You may have the following tests or measurements: Height, weight,  and BMI. Blood pressure. Lipid and cholesterol levels. These may be checked every 5 years, or more frequently if you are over 71 years old. Skin check. Lung cancer screening. You may have this screening every year starting at age 58 if you have a 30-pack-year history of smoking and currently smoke or have quit within the past 15 years. Fecal occult blood test (FOBT) of the stool. You may have this test every year starting at age 90. Flexible sigmoidoscopy or colonoscopy. You may have a sigmoidoscopy every 5 years or a colonoscopy every 10 years starting at age  36. Hepatitis C blood test. Hepatitis B blood test. Sexually transmitted disease (STD) testing. Diabetes screening. This is done by checking your blood sugar (glucose) after you have not eaten for a while (fasting). You may have this done every 1-3 years. Bone density scan. This is done to screen for osteoporosis. You may have this done starting at age 29. Mammogram. This may be done every 1-2 years. Talk to your health care provider about how often you should have regular mammograms. Talk with your health care provider about your test results, treatment options, and if necessary, the need for more tests. Vaccines  Your health care provider may recommend certain vaccines, such as: Influenza vaccine. This is recommended every year. Tetanus, diphtheria, and acellular pertussis (Tdap, Td) vaccine. You may need a Td booster every 10 years. Zoster vaccine. You may need this after age 68. Pneumococcal 13-valent conjugate (PCV13) vaccine. One dose is recommended after age 29. Pneumococcal polysaccharide (PPSV23) vaccine. One dose is recommended after age 32. Talk to your health care provider about which screenings and vaccines you need and how often you need them. This information is not intended to replace advice given to you by your health care provider. Make sure you discuss any questions you have with your health care provider. Document Released: 07/07/2015 Document Revised: 02/28/2016 Document Reviewed: 04/11/2015 Elsevier Interactive Patient Education  2017 [place] Prevention in the Home Falls can cause injuries. They can happen to people of all ages. There are many things you can do to make your home safe and to help prevent falls. What can I do on the outside of my home? Regularly fix the edges of walkways and driveways and fix any cracks. Remove anything that might make you trip as you walk through a door, such as a raised step or threshold. Trim any bushes or trees on the  path to your home. Use bright outdoor lighting. Clear any walking paths of anything that might make someone trip, such as rocks or tools. Regularly check to see if handrails are loose or broken. Make sure that both sides of any steps have handrails. Any raised decks and porches should have guardrails on the edges. Have any leaves, snow, or ice cleared regularly. Use sand or salt on walking paths during winter. Clean up any spills in your garage right away. This includes oil or grease spills. What can I do in the bathroom? Use night lights. Install grab bars by the toilet and in the tub and shower. Do not use towel bars as grab bars. Use non-skid mats or decals in the tub or shower. If you need to sit down in the shower, use a plastic, non-slip stool. Keep the floor dry. Clean up any water that spills on the floor as soon as it happens. Remove soap buildup in the tub or shower regularly. Attach bath mats securely with double-sided non-slip rug tape. Do  not have throw rugs and other things on the floor that can make you trip. What can I do in the bedroom? Use night lights. Make sure that you have a light by your bed that is easy to reach. Do not use any sheets or blankets that are too big for your bed. They should not hang down onto the floor. Have a firm chair that has side arms. You can use this for support while you get dressed. Do not have throw rugs and other things on the floor that can make you trip. What can I do in the kitchen? Clean up any spills right away. Avoid walking on wet floors. Keep items that you use a lot in easy-to-reach places. If you need to reach something above you, use a strong step stool that has a grab bar. Keep electrical cords out of the way. Do not use floor polish or wax that makes floors slippery. If you must use wax, use non-skid floor wax. Do not have throw rugs and other things on the floor that can make you trip. What can I do with my stairs? Do not  leave any items on the stairs. Make sure that there are handrails on both sides of the stairs and use them. Fix handrails that are broken or loose. Make sure that handrails are as long as the stairways. Check any carpeting to make sure that it is firmly attached to the stairs. Fix any carpet that is loose or worn. Avoid having throw rugs at the top or bottom of the stairs. If you do have throw rugs, attach them to the floor with carpet tape. Make sure that you have a light switch at the top of the stairs and the bottom of the stairs. If you do not have them, ask someone to add them for you. What else can I do to help prevent falls? Wear shoes that: Do not have high heels. Have rubber bottoms. Are comfortable and fit you well. Are closed at the toe. Do not wear sandals. If you use a stepladder: Make sure that it is fully opened. Do not climb a closed stepladder. Make sure that both sides of the stepladder are locked into place. Ask someone to hold it for you, if possible. Clearly mark and make sure that you can see: Any grab bars or handrails. First and last steps. Where the edge of each step is. Use tools that help you move around (mobility aids) if they are needed. These include: Canes. Walkers. Scooters. Crutches. Turn on the lights when you go into a dark area. Replace any light bulbs as soon as they burn out. Set up your furniture so you have a clear path. Avoid moving your furniture around. If any of your floors are uneven, fix them. If there are any pets around you, be aware of where they are. Review your medicines with your doctor. Some medicines can make you feel dizzy. This can increase your chance of falling. Ask your doctor what other things that you can do to help prevent falls. This information is not intended to replace advice given to you by your health care provider. Make sure you discuss any questions you have with your health care provider. Document Released:  04/06/2009 Document Revised: 11/16/2015 Document Reviewed: 07/15/2014 Elsevier Interactive Patient Education  2017 [employer].

## 2022-04-10 ENCOUNTER — Ambulatory Visit (INDEPENDENT_AMBULATORY_CARE_PROVIDER_SITE_OTHER): Payer: Medicare PPO | Admitting: Podiatry

## 2022-04-10 DIAGNOSIS — Z9889 Other specified postprocedural states: Secondary | ICD-10-CM

## 2022-04-10 NOTE — Progress Notes (Signed)
   Chief Complaint  Patient presents with   Post-op Follow-up    Patient is here for post op #2 DOS 03/28/22 amputation LT 2nd toe     Subjective:  Patient presents today status post left second toe amputation. DOS: 03/28/2022.  Patient states that she is doing very well.  No new complaints at this time  Past Medical History:  Diagnosis Date   Allergy    minor hay fever   Arthritis    hands x 2, knee    Cataract    small   Chicken pox 02/24/2014   Has had shingles vaccine   Chronic diarrhea    For one year-needed prolonged course of antibiotics   Diabetes mellitus without complication ([place])    Diverticulosis 02/24/2014   Emphysema of lung ([facility])    mild per pt. 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 Improved urinary incontinence    Past Surgical History:  Procedure Laterality Date   ABDOMINAL HYSTERECTOMY  09/07/2013   ABDOMINAL SACROCOLPOPEXY  09/07/2013   BLADDER SURGERY  09/07/2013   vaginal prolapse   BREAST BIOPSY Right    2014? near nipple. benign.   COLONOSCOPY  11/06/2005   sig tics, no polyps-[facility] [place]    CYSTOSCOPY  09/07/2013   ENTEROCELE REPAIR  09/07/2013   INDUCED ABORTION  1985   Therapeutic   LAPAROSCOPIC BILATERAL SALPINGO OOPHERECTOMY  09/07/2013   POLYPECTOMY     PUBOVAGINAL SLING  09/07/2013   TONSILLECTOMY Bilateral 1955   TUBAL LIGATION      Allergies  Allergen Reactions   Fluoride Preparations Nausea Only    Patient states it burned her mouth and she had abdominal pain for 24-36 hours (dental office)    Objective/Physical Exam Neurovascular status intact.  Skin incisions appear to be well coapted with sutures intact. No sign of infectious process noted. No dehiscence. No active bleeding  noted.  No for any significant edema noted to the surgical extremity.  Radiographic Exam LT foot 04/03/2022:  Absence of the second toe noted at the level of the MTP left foot.  No cortical irregularities or concern.  Assessment: 1. s/p second toe amputation LT foot. DOS: 03/28/2022   Plan of Care:  1. Patient was evaluated.  2.  Sutures removed 3.  Patient may begin washing and showering and getting the foot wet 4.  Patient may also transition out of the postsurgical shoe into good supportive sneakers 5 return to clinic 2 weeks for follow-up x-ray  Edrick Kins, DPM [facility]  Dr. Edrick Kins, DPM    [address],  [address]                Office [phone]  Fax [phone]

## 2022-04-11 ENCOUNTER — Other Ambulatory Visit: Payer: Self-pay | Admitting: Family Medicine

## 2022-04-17 ENCOUNTER — Ambulatory Visit (INDEPENDENT_AMBULATORY_CARE_PROVIDER_SITE_OTHER): Payer: Medicare PPO

## 2022-04-17 DIAGNOSIS — Z23 Encounter for immunization: Secondary | ICD-10-CM

## 2022-04-22 ENCOUNTER — Ambulatory Visit: Payer: Medicare PPO

## 2022-04-24 ENCOUNTER — Ambulatory Visit (INDEPENDENT_AMBULATORY_CARE_PROVIDER_SITE_OTHER): Payer: Medicare PPO

## 2022-04-24 ENCOUNTER — Ambulatory Visit (INDEPENDENT_AMBULATORY_CARE_PROVIDER_SITE_OTHER): Payer: Medicare PPO | Admitting: Podiatry

## 2022-04-24 DIAGNOSIS — M2042 Other hammer toe(s) (acquired), left foot: Secondary | ICD-10-CM

## 2022-04-24 DIAGNOSIS — Z9889 Other specified postprocedural states: Secondary | ICD-10-CM | POA: Diagnosis not present

## 2022-04-24 NOTE — Progress Notes (Signed)
   Chief Complaint  Patient presents with   Routine Post Op    POV #3 DOS 03/28/2022 AMPUTATION LT 2ND TOE    Subjective:  Patient presents today status post left second toe amputation. DOS: 03/28/2022.  Patient continues to do well.  She is walking in tennis shoes regularly and caring about her daily activities.  Past Medical History:  Diagnosis Date   Allergy    minor hay fever   Arthritis    hands x 2, knee    Cataract    small   Chicken pox 02/24/2014   Has had shingles vaccine   Chronic diarrhea    For one year-needed prolonged course of antibiotics   Diabetes mellitus without complication ([place])    Diverticulosis 02/24/2014   Emphysema of lung (HCC)    mild per pt. 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 Improved urinary incontinence    Past Surgical History:  Procedure Laterality Date   ABDOMINAL HYSTERECTOMY  09/07/2013   ABDOMINAL SACROCOLPOPEXY  09/07/2013   BLADDER SURGERY  09/07/2013   vaginal prolapse   BREAST BIOPSY Right    2014? near nipple. benign.   COLONOSCOPY  11/06/2005   sig tics, no polyps-[facility] [place]    CYSTOSCOPY  09/07/2013   ENTEROCELE REPAIR  09/07/2013   INDUCED ABORTION  1985   Therapeutic   LAPAROSCOPIC BILATERAL SALPINGO OOPHERECTOMY  09/07/2013   POLYPECTOMY     PUBOVAGINAL SLING  09/07/2013   TONSILLECTOMY Bilateral 1955   TUBAL LIGATION      Allergies  Allergen Reactions   Fluoride Preparations Nausea Only    Patient states it burned her mouth and she had abdominal pain for 24-36 hours (dental office)    Objective/Physical Exam Neurovascular status intact.  Skin incisions nicely healed.  No edema or erythema.  Clinically no indication or concern for infection.  She continues to  have hallux valgus deformity but this is asymptomatic.  Radiographic Exam LT foot 04/24/2022:  Absence of the second toe noted at the level of the MTP left foot.  No cortical irregularities or concern.  Assessment: 1. s/p second toe amputation LT foot. DOS: 03/28/2022 2.  Hallux valgus left.  Asymptomatic.   Plan of Care:  1. Patient was evaluated.  2.  Patient is doing well.  Her incision site has nicely healed.  She is back into tennis shoes.  She may resume good supportive sneakers 3.  Full activity no restrictions 4.  Return to clinic as needed  Edrick Kins, DPM [facility]  Dr. Edrick Kins, DPM    [address], [place] [address]                Office [phone]  Fax [phone]

## 2022-05-03 ENCOUNTER — Ambulatory Visit
Admission: RE | Admit: 2022-05-03 | Discharge: 2022-05-03 | Disposition: A | Discharge status: Home / Self Care | Payer: Medicare PPO | Source: Ambulatory Visit | Attending: Family Medicine | Admitting: Family Medicine

## 2022-05-03 DIAGNOSIS — Z1231 Encounter for screening mammogram for malignant neoplasm of breast: Secondary | ICD-10-CM

## 2022-05-07 ENCOUNTER — Encounter: Payer: Self-pay | Admitting: Family Medicine

## 2022-05-07 ENCOUNTER — Other Ambulatory Visit: Payer: Self-pay | Admitting: Family Medicine

## 2022-05-07 DIAGNOSIS — R928 Other abnormal and inconclusive findings on diagnostic imaging of breast: Secondary | ICD-10-CM

## 2022-05-09 ENCOUNTER — Other Ambulatory Visit: Payer: Self-pay | Admitting: Family Medicine

## 2022-05-23 ENCOUNTER — Ambulatory Visit
Admission: RE | Admit: 2022-05-23 | Discharge: 2022-05-23 | Disposition: A | Discharge status: Home / Self Care | Payer: Medicare PPO | Source: Ambulatory Visit | Attending: Family Medicine | Admitting: Family Medicine

## 2022-05-23 DIAGNOSIS — R928 Other abnormal and inconclusive findings on diagnostic imaging of breast: Secondary | ICD-10-CM

## 2022-05-23 DIAGNOSIS — R92321 Mammographic fibroglandular density, right breast: Secondary | ICD-10-CM | POA: Diagnosis not present

## 2022-05-23 DIAGNOSIS — N6489 Other specified disorders of breast: Secondary | ICD-10-CM | POA: Diagnosis not present

## 2022-05-26 ENCOUNTER — Other Ambulatory Visit: Payer: Self-pay | Admitting: Family Medicine

## 2022-06-12 DIAGNOSIS — E119 Type 2 diabetes mellitus without complications: Secondary | ICD-10-CM | POA: Diagnosis not present

## 2022-06-20 LAB — HM DIABETES EYE EXAM

## 2022-06-28 ENCOUNTER — Encounter: Payer: Self-pay | Admitting: Podiatry

## 2022-06-28 ENCOUNTER — Ambulatory Visit: Payer: Medicare PPO | Admitting: Podiatry

## 2022-06-28 VITALS — BP 130/67

## 2022-06-28 DIAGNOSIS — E1142 Type 2 diabetes mellitus with diabetic polyneuropathy: Secondary | ICD-10-CM

## 2022-06-28 DIAGNOSIS — Z89422 Acquired absence of other left toe(s): Secondary | ICD-10-CM

## 2022-06-28 NOTE — Patient Instructions (Addendum)
Clinical Manager: Janann August- Gerri Spore, RN Office Admnistrator:  Sindy Messing, RN

## 2022-06-28 NOTE — Progress Notes (Unsigned)
  Subjective:  Patient ID: Samantha Snyder, female    DOB: 1945-08-04,  MRN: [id number]  Samantha Snyder presents to clinic today for {jgcomplaint:23593}  Chief Complaint  Patient presents with   Nail Problem    [facility] BS-113 A1C-6.7 PCP- Garret Reddish PCP VST-01/2022   New problem(s): None. {jgcomplaint:23593}  PCP is Marin Olp, MD.  Allergies  Allergen Reactions   Fluoride Preparations Nausea Only    Patient states it burned her mouth and she had abdominal pain for 24-36 hours (dental office)    Review of Systems: Negative except as noted in the HPI.  Objective: No changes noted in today's physical examination. Vitals:   06/28/22 0959  BP: 130/67   Samantha Snyder is a pleasant 77 y.o. female {jgbodyhabitus:24098} AAO x 3.  Assessment/Plan: No diagnosis found.  No orders of the defined types were placed in this encounter.   None {Jgplan:23602::"-Patient/POA to call should there be question/concern in the interim."}   No follow-ups on file.  Marzetta Board, DPM

## 2022-07-17 ENCOUNTER — Encounter: Payer: Self-pay | Admitting: Family Medicine

## 2022-07-18 ENCOUNTER — Other Ambulatory Visit: Payer: Self-pay

## 2022-07-18 MED ORDER — DICLOFENAC SODIUM 1 % EX GEL: 2.0000 g | Freq: Four times a day (QID) | CUTANEOUS | 5 refills | Status: DC | PRN

## 2022-07-19 ENCOUNTER — Other Ambulatory Visit: Payer: Self-pay | Admitting: Family Medicine

## 2022-07-19 ENCOUNTER — Other Ambulatory Visit: Payer: Self-pay

## 2022-07-19 MED ORDER — DICLOFENAC SODIUM 1 % EX GEL: 2.0000 g | Freq: Four times a day (QID) | CUTANEOUS | 5 refills | Status: DC | PRN

## 2022-08-14 ENCOUNTER — Ambulatory Visit (INDEPENDENT_AMBULATORY_CARE_PROVIDER_SITE_OTHER): Payer: Medicare PPO | Admitting: Family Medicine

## 2022-08-14 ENCOUNTER — Encounter: Payer: Self-pay | Admitting: Family Medicine

## 2022-08-14 VITALS — BP 122/78 | HR 89 | Temp 97.0°F | Ht 69.0 in | Wt 185.6 lb

## 2022-08-14 DIAGNOSIS — M810 Age-related osteoporosis without current pathological fracture: Secondary | ICD-10-CM

## 2022-08-14 DIAGNOSIS — Z1283 Encounter for screening for malignant neoplasm of skin: Secondary | ICD-10-CM | POA: Diagnosis not present

## 2022-08-14 DIAGNOSIS — Z720 Tobacco use: Secondary | ICD-10-CM

## 2022-08-14 DIAGNOSIS — Z0000 Encounter for general adult medical examination without abnormal findings: Secondary | ICD-10-CM

## 2022-08-14 DIAGNOSIS — I700 Atherosclerosis of aorta: Secondary | ICD-10-CM | POA: Diagnosis not present

## 2022-08-14 DIAGNOSIS — Z8679 Personal history of other diseases of the circulatory system: Secondary | ICD-10-CM

## 2022-08-14 DIAGNOSIS — E1142 Type 2 diabetes mellitus with diabetic polyneuropathy: Secondary | ICD-10-CM | POA: Diagnosis not present

## 2022-08-14 DIAGNOSIS — E1169 Type 2 diabetes mellitus with other specified complication: Secondary | ICD-10-CM | POA: Diagnosis not present

## 2022-08-14 DIAGNOSIS — J439 Emphysema, unspecified: Secondary | ICD-10-CM | POA: Diagnosis not present

## 2022-08-14 DIAGNOSIS — E785 Hyperlipidemia, unspecified: Secondary | ICD-10-CM

## 2022-08-14 LAB — CBC WITH DIFFERENTIAL/PLATELET
Basophils Absolute: 0.1 10*3/uL (ref 0.0–0.1)
Basophils Relative: 1 % (ref 0.0–3.0)
Eosinophils Absolute: 0.1 10*3/uL (ref 0.0–0.7)
Eosinophils Relative: 1.4 % (ref 0.0–5.0)
HCT: 46.1 % — ABNORMAL HIGH (ref 36.0–46.0)
Hemoglobin: 15.3 g/dL — ABNORMAL HIGH (ref 12.0–15.0)
Lymphocytes Relative: 15.4 % (ref 12.0–46.0)
Lymphs Abs: 1.4 10*3/uL (ref 0.7–4.0)
MCHC: 33.1 g/dL (ref 30.0–36.0)
MCV: 92.3 fl (ref 78.0–100.0)
Monocytes Absolute: 0.7 10*3/uL (ref 0.1–1.0)
Monocytes Relative: 7.2 % (ref 3.0–12.0)
Neutro Abs: 6.8 10*3/uL (ref 1.4–7.7)
Neutrophils Relative %: 75 % (ref 43.0–77.0)
Platelets: 372 10*3/uL (ref 150.0–400.0)
RBC: 4.99 Mil/uL (ref 3.87–5.11)
RDW: 15 % (ref 11.5–15.5)
WBC: 9 10*3/uL (ref 4.0–10.5)

## 2022-08-14 LAB — URINALYSIS, ROUTINE W REFLEX MICROSCOPIC
Bilirubin Urine: NEGATIVE
Hgb urine dipstick: NEGATIVE
Ketones, ur: NEGATIVE
Nitrite: NEGATIVE
Specific Gravity, Urine: 1.02 (ref 1.000–1.030)
Total Protein, Urine: NEGATIVE
Urine Glucose: NEGATIVE
Urobilinogen, UA: 0.2 (ref 0.0–1.0)
pH: 6 (ref 5.0–8.0)

## 2022-08-14 LAB — HEMOGLOBIN A1C: Hgb A1c MFr Bld: 6.4 % (ref 4.6–6.5)

## 2022-08-14 MED ORDER — BD SWAB SINGLE USE REGULAR PADS: MEDICATED_PAD | 3 refills | Status: DC

## 2022-08-14 MED ORDER — OZEMPIC (0.25 OR 0.5 MG/DOSE) 2 MG/3ML ~~LOC~~ SOPN: PEN_INJECTOR | SUBCUTANEOUS | 5 refills | Status: DC

## 2022-08-14 NOTE — Patient Instructions (Addendum)
Please stop by lab before you go If you have mychart- we will send your results within 3 business days of [place] receiving them.  If you do not have mychart- we will call you about results within 5 business days of [place] receiving them.  *please also note that you will see labs on mychart as soon as they post. I will later go in and write notes on them- will say "notes from Dr. Yong Channel"   Team please update covid shot to the new vaccine- not bivalent  Schedule your bone density test at check out desk after 10/27/2022 - located [address] across the street from [facility] - in the basement - you DO NEED an appointment for the bone density tests.   We will either call you (or see alternate below) within two weeks about your referral to dermatology specialists . Our referral specialist will sometimes also send you a mychart link once referral is approved and then you will call the # listed on there (let us know if you do not see this within 2 weeks or have not received call)  Recommended follow up: Return in about 6 months (around 02/12/2023) for followup or sooner if needed.Schedule b4 you leave. As long as a1c looks good

## 2022-08-14 NOTE — Progress Notes (Signed)
Phone [phone]   Subjective:  Patient presents today for their annual physical. Chief complaint-noted.   See problem oriented charting- ROS- full  review of systems was completed and negative except for: some back pains after picking up gumballs in yard (improving- offered PT but declines)- muscle aches with this- no midline pain, diarrhea with ozempic and or fosamax  The following were reviewed and entered/updated in epic: Past Medical History:  Diagnosis Date   Allergy    minor hay fever   Arthritis    hands x 2, knee    Cataract    small   Chicken pox 02/24/2014   Has had shingles vaccine   Chronic diarrhea    For one year-needed prolonged course of antibiotics   Diabetes mellitus without complication ([place])    Diverticulosis 02/24/2014   Emphysema of lung ([place])    mild per pt. per x ray    History of UTI 2014   per pt noted after taking Bactrim due to tooth infection   Hyperlipidemia    Neuromuscular disorder ([place])    neuropathy both feet    Osteopenia    Right adrenal mass ([place])    Biopsied in 2008 and benign   Vaginal prolapse    Status post surgery. Improved urinary incontinence   Patient Active Problem List   Diagnosis Date Noted   History of subdural hematoma 10/05/2021    Priority: High   DM (diabetes mellitus) type II controlled, neurological manifestation ([place]) 02/24/2014    Priority: High   Tobacco abuse 02/24/2014    Priority: High   Aortic atherosclerosis ([place]) 07/08/2017    Priority: Medium    Osteoporosis 03/30/2014    Priority: Medium    Essential hypertension 03/08/2014    Priority: Medium    Hyperlipidemia associated with type 2 diabetes mellitus ([place]) 02/24/2014    Priority: Medium    Emphysema lung ([place]) 12/19/2020    Priority: Low   Diabetic peripheral neuropathy ([place]) 10/27/2014    Priority: Low   Seasonal allergies 02/24/2014    Priority: Low   Past Surgical History:  Procedure Laterality Date   ABDOMINAL HYSTERECTOMY  09/07/2013    ABDOMINAL SACROCOLPOPEXY  09/07/2013   BLADDER SURGERY  09/07/2013   vaginal prolapse   BREAST BIOPSY Right    2014? near nipple. benign.   COLONOSCOPY  11/06/2005   sig tics, no polyps-[facility]    CYSTOSCOPY  09/07/2013   ENTEROCELE REPAIR  09/07/2013   INDUCED ABORTION  1985   Therapeutic   LAPAROSCOPIC BILATERAL SALPINGO OOPHERECTOMY  09/07/2013   POLYPECTOMY     PUBOVAGINAL SLING  09/07/2013   TONSILLECTOMY Bilateral 1955   TUBAL LIGATION      Family History  Problem Relation Age of Onset   Cancer Father        Lung but was a smoker   Stroke Father        49 massive lead to death   Diabetes Father    Bladder Cancer Father    Lung cancer Father    Osteoporosis Mother    Anuerysm Mother    Diabetes Maternal Grandmother    Colon cancer Neg Hx    Colon polyps Neg Hx    Esophageal cancer Neg Hx    Rectal cancer Neg Hx    Stomach cancer Neg Hx    Breast cancer Neg Hx     Medications- reviewed and updated Current Outpatient Medications  Medication Sig Dispense Refill   acetaminophen (TYLENOL) 500 MG  tablet Take 500 mg by mouth in the morning and at bedtime.     alendronate (FOSAMAX) 70 MG tablet TAKE 1 TABLET BY MOUTH EVERY 7 DAYS. TAKE WITH A FULL GLASS OF WATER ON AN EMPTY STOMACH. 12 tablet 3   Alpha-Lipoic Acid 600 MG CAPS TAKE 1 CAPSULE (600 MG TOTAL) BY MOUTH DAILY. 60 capsule 5   atorvastatin (LIPITOR) 20 MG tablet TAKE 1 TABLET EVERY DAY 90 tablet 3   Blood Glucose Monitoring Suppl (TRUE METRIX AIR GLUCOSE METER) DEVI Use to test blood sugars daily. Dx: E11.9 1 each 3   Calcium Carbonate (CALCIUM 600 PO) Take 1,200 mg by mouth at bedtime.     Cholecalciferol (VITAMIN D3) 2000 UNITS TABS Take 2,000 Units by mouth at bedtime.     diclofenac Sodium (VOLTAREN) 1 % GEL Apply 2 g topically 4 (four) times daily as needed (to painful sites of feet and hands). 400 g 5   fexofenadine (ALLEGRA) 180 MG tablet Take 180 mg by mouth daily.     glucose blood (TRUE  METRIX BLOOD GLUCOSE TEST) test strip TEST BLOOD SUGAR UP TO FOUR TIMES DAILY 350 strip 3   lisinopril (ZESTRIL) 5 MG tablet TAKE 1 TABLET EVERY DAY 90 tablet 3   Magnesium 500 MG CAPS Take 500 mg by mouth at bedtime.     metFORMIN (GLUCOPHAGE) 1000 MG tablet TAKE 1 TABLET TWICE DAILY WITH MEALS 180 tablet 3   Multiple Vitamins-Minerals (HAIR SKIN AND NAILS FORMULA PO) Take 2 tablets by mouth daily.     Multiple Vitamins-Minerals (MULTIVITAMIN PO) Take 1 tablet by mouth at bedtime.     Omega-3 Fatty Acids (FISH OIL PO) Take 2,000 mg by mouth in the morning and at bedtime.     OVER THE COUNTER MEDICATION Take 240 mLs by mouth See admin instructions. Herbal tea- Drink 8 ounces by mouth daily     Probiotic Product (PROBIOTIC DAILY PO) Take 1 capsule by mouth every evening.     TRUEplus Lancets 33G MISC TEST BLOOD SUGAR UP TO FOUR TIMES DAILY 400 each 3   Alcohol Swabs (B-D SINGLE USE SWABS REGULAR) PADS Use to cleanse are before checking blood sugar. Dx: E11.9 400 each 3   Semaglutide,0.25 or 0.5MG/DOS, (OZEMPIC, 0.25 OR 0.5 MG/DOSE,) 2 MG/3ML SOPN INJECT 0.5MG AS DIRECTED ONCE WEEKLY 3 mL 5   No current facility-administered medications for this visit.    Allergies-reviewed and updated Allergies  Allergen Reactions   Fluoride Preparations Nausea Only    Patient states it burned her mouth and she had abdominal pain for 24-36 hours (dental office)    Social History   Social History Narrative   Married 45 years in 2015.  Moved to [place] to be near daughter who lives here. Has a son as well and 83 year old grandson. 3 granddogs. One dog at home       Retired former  Academic librarian. Has a BS in biology from [employer]. Used to be an algologist-studied algae under microscope      Hobbies-exercising with her husband      Objective  Objective:  BP 122/78   Pulse 89   Temp (!) 97 F (36.1 C)   Ht 5' 9"$  (1.753 m)   Wt 185 lb 9.6 oz (84.2 kg)   LMP  (LMP Unknown)   SpO2 97%    BMI 27.41 kg/m  Gen: NAD, resting comfortably HEENT: Mucous membranes are moist. Oropharynx normal Neck: no thyromegaly CV: RRR no murmurs rubs  or gallops Lungs: CTAB no crackles, wheeze, rhonchi Abdomen: soft/nontender/nondistended/normal bowel sounds. No rebound or guarding.  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 Hammering noted on 3rd toe on left foot still- also with callous at IP joint on 1st toe mediallyand more shallow callous laterally. 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 Bunions noted bilaterally.   Only intermittently feels monofilament on tops and bottoms of feet- no sensation on toes.       Assessment and Plan   77 y.o. female presenting for annual physical.  Health Maintenance counseling: 1. Anticipatory guidance: Patient counseled regarding regular dental exams -q4 months, eye exams - yearly,  avoiding smoking and second hand smoke , limiting alcohol to 1 beverage per day- none , no illicit drugs .   2. Risk factor reduction:  Advised patient of need for regular exercise and diet rich and fruits and vegetables to reduce risk of heart attack and stroke.  Exercise- walking down lately due to pack. She is going to try exercises. Balance issues with neuropathy Diet/weight management-Down 13 pounds from last physical- trying to eat better and ozempic helpful.  Wt Readings from Last 3 Encounters:  08/14/22 185 lb 9.6 oz (84.2 kg)  04/08/22 188 lb (85.3 kg)  02/04/22 188 lb 6.4 oz (85.5 kg)  3. Immunizations/screenings/ancillary studies-fully up-to-date on vaccinations-October  vaccination was the updated vaccine and not bivalent Immunization History  Administered Date(s) Administered   Fluad Quad(high Dose 65+) 03/15/2019, 03/20/2020, 03/30/2021, 04/17/2022   Hepatitis A 02/28/1948   Hepatitis B 03[birth year]28/1947   Influenza, High Dose Seasonal PF 03/17/2014, 03/07/2016, 03/11/2017, 03/12/2018   Influenza,inj,Quad PF,6+ Mos 03/02/2015   Influenza-Unspecified 03/10/2010, 02/25/2012, 03/17/2013   MMR 05/14/1990, 05/14/1992   Moderna Covid-19 Vaccine Bivalent Booster 82yr & up 04/05/2022   PFIZER(Purple Top)SARS-COV-2 Vaccination 08/15/2019, 09/08/2019, 04/23/2020, 10/25/2020, 11/02/2021   Pfizer Covid-19 Vaccine Bivalent Booster 139yr& up 03/06/2021   Pneumococcal Conjugate-13 03/17/2013, 06/07/2013   Pneumococcal Polysaccharide-23 09/19/2005, 02/24/2014   Pneumococcal-Unspecified 09/19/2005   Respiratory Syncytial Virus Vaccine,Recomb Aduvanted(Arexvy) 05/08/2022   Td 02/28/1950, 02/29/1956, 09/19/2005, 03/07/2016   Tdap 02/29/1960   Varicella 10/19/1991   Zoster Recombinat (Shingrix) 10/28/2016, 01/30/2017   Zoster, Live 02/28/2009   4. Cervical cancer screening- past age based screening recommendations   5. Breast cancer screening-  breast exam-  breast exam declined typically-   and mammogram - 05/23/22 6. Colon cancer screening -01/21/2019 with 5 year repeat due to adenoma    7. Skin cancer screening- no dermatologist but will refer- wants general check and has some seborrheic keratosis I believe and skin tangs she would like looked at.  advised regular sunscreen use.  8. Birth control/STD check-postmenopausal, monogomous  9. Osteoporosis screening at 653805/10/2020 see below -current smoker -strongly encourage cessation- 1 PPD.  Enrolled in lung cancer screening program- due in june.  Get UA. -Incidental emphysema noted but asymptomatic-continue to monitor without medication   Status of chronic or acute concerns   #hypertension S: medication: Lisinopril  5Mg BP Readings from Last 3 Encounters:  08/14/22 122/78  06/28/22 130/67  02/04/22 120/80  A/P: stable- continue current medicines    #hyperlipidemia #  Aortic atherosclerosis-LDL goal under 70.  Incidental finding on imaging S: Medication: Atorvastatin 20Mg -Prior on aspirin 81 mg for primary prevention-stopped after subdural hematoma April 2023 Lab Results  Component Value Date   CHOL 127 03/30/2021   HDL 43.90 03/30/2021   LDLCALC 47 03/30/2021   LDLDIRECT 73.0 03/11/2017   TRIG 181.0 (H) 03/30/2021   CHOLHDL 3 03/30/2021   A/P: hyperlipidemia - hopefully stable- update lipid today. Continue current meds for now Aortic atherosclerosis (presumed stable)- LDL goal ideally <70 - update today  # Diabetes-A1c typically under 7 #Diabetic neuropathy-alpha lipoic acid generally helpful, also on voltaren gel .  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 Hip fracture risk at 4.9% over 3% led to this.   Calcium: 1241m (through diet ok) recommended - taking Vitamin D: 1000 units a day recommended- taking A/P: hopefully stable- update dexa in may. Continue current meds for now   # History of subdural hematoma after accident in early 2023-follow-up with neurosurgery and reported clearance August 2023   Recommended follow up: Return in about 6 months (around 02/12/2023) for followup or sooner  if needed.Schedule b4 you leave. Future Appointments  Date Time Provider [place] 04/14/2023  1:30 PM [facility]   Lab/Order associations: fasting   ICD-10-CM   1. Preventative health care  Z00.00     2. Controlled type 2 diabetes mellitus with diabetic polyneuropathy, without long-term current use of insulin (HCC)  E11.42 Hemoglobin A1c    CBC with Differential/Platelet    Comprehensive metabolic panel    Lipid panel    3. Tobacco abuse  Z72.0 Urinalysis, Routine w reflex microscopic    4. Aortic atherosclerosis (HCC)  I70.0     5. Hyperlipidemia associated with type 2 diabetes mellitus (HCC)  E11.69    E78.5     6. Age-related osteoporosis without current pathological fracture  M81.0 DG Bone Density    7. Diabetic peripheral neuropathy ([facility]CC)  E11.42     8. Pulmonary emphysema, unspecified emphysema type ([facility]  J43.9     9. History of subdural hematoma  Z86.79     10. Screening exam for skin cancer  Z12.83 Ambulatory referral to Dermatology     Meds ordered this encounter  Medications   Semaglutide,0.25 or 0.5MG/DOS, (OZEMPIC, 0.25 OR 0.5 MG/DOSE,) 2 MG/3ML SOPN    Sig: INJECT 0.5MG AS DIRECTED ONCE WEEKLY    Dispense:  3 mL    Refill:  5   Alcohol Swabs (B-D SINGLE USE SWABS REGULAR) PADS    Sig: Use to cleanse are before checking blood sugar. Dx: E11.9    Dispense:  400 each    Refill:  3   Return precautions advised.  SGarret Reddish MD

## 2022-08-15 LAB — COMPREHENSIVE METABOLIC PANEL
ALT: 14 U/L (ref 0–35)
AST: 13 U/L (ref 0–37)
Albumin: 4.2 g/dL (ref 3.5–5.2)
Alkaline Phosphatase: 82 U/L (ref 39–117)
BUN: 21 mg/dL (ref 6–23)
CO2: 23 mEq/L (ref 19–32)
Calcium: 9.7 mg/dL (ref 8.4–10.5)
Chloride: 108 mEq/L (ref 96–112)
Creatinine, Ser: 0.63 mg/dL (ref 0.40–1.20)
GFR: 87.03 mL/min (ref >60.00)
Glucose, Bld: 121 mg/dL — ABNORMAL HIGH (ref 70–99)
Potassium: 4.4 mEq/L (ref 3.5–5.1)
Sodium: 143 mEq/L (ref 135–145)
Total Bilirubin: 0.4 mg/dL (ref 0.2–1.2)
Total Protein: 6.4 g/dL (ref 6.0–8.3)

## 2022-08-15 LAB — LIPID PANEL
Cholesterol: 132 mg/dL (ref 0–200)
HDL: 47.1 mg/dL (ref >39.00)
LDL Cholesterol: 58 mg/dL (ref 0–99)
NonHDL: 84.72
Total CHOL/HDL Ratio: 3
Triglycerides: 133 mg/dL (ref 0.0–149.0)
VLDL: 26.6 mg/dL (ref 0.0–40.0)

## 2022-08-16 ENCOUNTER — Encounter: Payer: Self-pay | Admitting: Family Medicine

## 2022-08-19 ENCOUNTER — Encounter: Payer: Self-pay | Admitting: Family Medicine

## 2022-08-19 ENCOUNTER — Other Ambulatory Visit: Payer: Self-pay

## 2022-08-19 MED ORDER — BD SWAB SINGLE USE REGULAR PADS: MEDICATED_PAD | 3 refills | Status: DC

## 2022-08-20 ENCOUNTER — Other Ambulatory Visit: Payer: Self-pay

## 2022-08-20 DIAGNOSIS — Z1283 Encounter for screening for malignant neoplasm of skin: Secondary | ICD-10-CM

## 2022-08-28 ENCOUNTER — Other Ambulatory Visit: Payer: Self-pay | Admitting: Family Medicine

## 2022-09-03 DIAGNOSIS — E1142 Type 2 diabetes mellitus with diabetic polyneuropathy: Secondary | ICD-10-CM | POA: Diagnosis not present

## 2022-09-06 ENCOUNTER — Telehealth: Payer: Self-pay | Admitting: Family Medicine

## 2022-09-06 NOTE — Telephone Encounter (Signed)
Caller states although she got diabetic form back from PCP, she needs a order/RX placed for patient's bilateral foot orthoses. Order needs to be made out to [facility] @ [phone] (fax).

## 2022-09-10 NOTE — Telephone Encounter (Signed)
Order has been refaxed to below number.

## 2022-09-10 NOTE — Telephone Encounter (Signed)
Samantha Snyder states order/RX placed for patient's bilateral foot orthoses. Order needs to be made out to [facility] @ [phone] (fax) has not been received.  Requests the above be sent asap for Patient's Diabetic shoes.

## 2022-09-23 DIAGNOSIS — S065XAA Traumatic subdural hemorrhage with loss of consciousness status unknown, initial encounter: Secondary | ICD-10-CM

## 2022-09-23 HISTORY — DX: Traumatic subdural hemorrhage with loss of consciousness status unknown, initial encounter: S06.5XAA

## 2022-10-15 DIAGNOSIS — E1141 Type 2 diabetes mellitus with diabetic mononeuropathy: Secondary | ICD-10-CM | POA: Diagnosis not present

## 2022-10-29 ENCOUNTER — Ambulatory Visit (INDEPENDENT_AMBULATORY_CARE_PROVIDER_SITE_OTHER)
Admission: RE | Admit: 2022-10-29 | Discharge: 2022-10-29 | Disposition: A | Discharge status: Home / Self Care | Payer: Medicare PPO | Source: Ambulatory Visit | Attending: Family Medicine | Admitting: Family Medicine

## 2022-10-29 DIAGNOSIS — M810 Age-related osteoporosis without current pathological fracture: Secondary | ICD-10-CM | POA: Diagnosis not present

## 2022-11-05 ENCOUNTER — Encounter: Payer: Self-pay | Admitting: Family Medicine

## 2022-11-06 ENCOUNTER — Other Ambulatory Visit: Payer: Self-pay

## 2022-11-06 DIAGNOSIS — M545 Low back pain, unspecified: Secondary | ICD-10-CM

## 2022-11-16 ENCOUNTER — Other Ambulatory Visit: Payer: Self-pay | Admitting: Family Medicine

## 2022-11-20 ENCOUNTER — Ambulatory Visit: Payer: Medicare PPO | Admitting: Physical Therapy

## 2022-11-20 DIAGNOSIS — R2689 Other abnormalities of gait and mobility: Secondary | ICD-10-CM | POA: Diagnosis not present

## 2022-11-20 DIAGNOSIS — M5459 Other low back pain: Secondary | ICD-10-CM | POA: Diagnosis not present

## 2022-11-20 DIAGNOSIS — M6281 Muscle weakness (generalized): Secondary | ICD-10-CM

## 2022-11-20 NOTE — Therapy (Signed)
OUTPATIENT PHYSICAL THERAPY THORACOLUMBAR EVALUATION   Patient Name: Samantha Snyder MRN: [id number] DOB:1946-06-05, 77 y.o., female Today's Date: 11/20/2022  END OF SESSION:   Past Medical History:  Diagnosis Date   Allergy    minor hay fever   Arthritis    hands x 2, knee    Cataract    small   Chicken pox 02/24/2014   Has had shingles vaccine   Chronic diarrhea    For one year-needed prolonged course of antibiotics   Diabetes mellitus without complication (HCC)    Diverticulosis 02/24/2014   Emphysema of lung (HCC)    mild per pt. per x ray    History of UTI 2014   per pt noted after taking Bactrim due to tooth infection   Hyperlipidemia    Neuromuscular disorder ([facility]CC)    neuropathy both feet    Osteopenia    Right adrenal mass ([facility])    Biopsied in 2008 and benign   Vaginal prolapse    Status post surgery. Improved urinary incontinence   Past Surgical History:  Procedure Laterality Date   ABDOMINAL HYSTERECTOMY  09/07/2013   ABDOMINAL SACROCOLPOPEXY  09/07/2013   BLADDER SURGERY  09/07/2013   vaginal prolapse   BREAST BIOPSY Right    2014? near nipple. benign.   COLONOSCOPY  11/06/2005   sig tics, no polyps-[facility]    CYSTOSCOPY  09/07/2013   ENTEROCELE REPAIR  09/07/2013   INDUCED ABORTION  1985   Therapeutic   LAPAROSCOPIC BILATERAL SALPINGO OOPHERECTOMY  09/07/2013   POLYPECTOMY     PUBOVAGINAL SLING  09/07/2013   TONSILLECTOMY Bilateral 1955   TUBAL LIGATION     Patient Active Problem List   Diagnosis Date Noted   History of subdural hematoma 10/05/2021   Emphysema lung (HCC) 12/19/2020   Aortic atherosclerosis (HCC) 07/08/2017   Diabetic peripheral neuropathy ([facility]CC) 10/27/2014   Osteoporosis 03/30/2014   Essential hypertension 03/08/2014   DM (diabetes mellitus) type II controlled, neurological manifestation ([facility]) 02/24/2014   Seasonal allergies 02/24/2014   Hyperlipidemia associated with type 2 diabetes mellitus (HCC) 02/24/2014    Tobacco abuse 02/24/2014    PCP: ***  REFERRING PROVIDER: ***  REFERRING DIAG: ***  Rationale for Evaluation and Treatment: Rehabilitation  THERAPY DIAG:  No diagnosis found.  ONSET DATE: February 2024.  SUBJECTIVE:                                                                                                                                                                                           SUBJECTIVE STATEMENT: Pt saw PCP in February. Was picking up yard with reacher. Had back  pain then, has improved some.  Was feeling shift/pop at that time.  Using voltaren. R middle finger/OA painful.  Neuopathy in feet- L toe?  2nd toe amputated bc it was crossing over.  Back now- not so much pain.. but but feels she is "slowed down" may be more feet/walking.   Has elliptical- seated. can't walk for exercise.   Clever neruomuscular- [place]- for neuropathy. I runner- other shoe-wearing orthofeet slippers/house shoes.   Le strength,  strenth core,  Feet/balance, bending, sit to stand.  1 fall last year- inyard, had subdural hematoma. Did dissipate- now ok.     PERTINENT HISTORY:  Neuropathy, osteoporosis, Emphysema, DM, OA,   PAIN:  Are you having pain? Not in back, back just feels weak Yes:  Pain location: 8/10 in bil feet/lower legs. Pain description: Numb and tingly  Aggravating factors: none , hurts all the time.  Relieving factors: none,   PRECAUTIONS: None  WEIGHT BEARING RESTRICTIONS: No  FALLS:  Has patient fallen in last 6 months? No  LIVING ENVIRONMENT: Lives with: {OPRC lives with:25569::"lives with their family"} Lives in: {Lives in:25570} Stairs: {opstairs:27293} Has following equipment at home: {Assistive devices:23999}  OCCUPATION: ***  PLOF: {PLOF:24004}  PATIENT GOALS: ***  NEXT MD VISIT: ***  OBJECTIVE:   DIAGNOSTIC FINDINGS:  ***  PATIENT SURVEYS:  {rehab surveys:24030}  COGNITION: Overall cognitive status: Within functional  limits for tasks assessed     SENSATION: Light touch: Impaired for bil feet/lower legs   POSTURE:  Tendency for fwd trunk flexion, fwd head, rounded shoulders.  Feet: bil hallux valgus, pronation R>L, pes planus R > L, moderate toeing out bilaterally.   PALPATION: Lipomas 2-3 on L, 1 on R low lumbar R 2nd toe inc IP ext, red sore.    LUMBAR ROM:   Hips: WFL,   Knees: WNL ,  Ankles: mild limitation for DF, mod limitation for Inv/Ev.   AROM eval  Flexion WFL  Extension Mild limitation  Right lateral flexion   Left lateral flexion   Right rotation   Left rotation    (Blank rows = not tested)     LOWER EXTREMITY MMT:    MMT Right eval Left eval  Hip flexion 3- 3-  Hip extension    Hip abduction 3- 3-  Hip adduction    Hip internal rotation    Hip external rotation    Knee flexion 4 4  Knee extension 4 4  Ankle dorsiflexion    Ankle plantarflexion    Ankle inversion    Ankle eversion     (Blank rows = not tested)  LUMBAR SPECIAL TESTS:  {lumbar special test:25242}  FUNCTIONAL TESTS:  {Functional tests:24029}  GAIT: Distance walked: *** Assistive device utilized: {Assistive devices:23999} Level of assistance: {Levels of assistance:24026} Comments: ***  TODAY'S TREATMENT:  DATE: ***    PATIENT EDUCATION:  Education details: *** Person educated: {Person educated:25204} Education method: {Education Method:25205} Education comprehension: {Education Comprehension:25206}  HOME EXERCISE PROGRAM: ***  ASSESSMENT:  CLINICAL IMPRESSION: Patient is a *** y.o. *** who was seen today for physical therapy evaluation and treatment for ***.   OBJECTIVE IMPAIRMENTS: {opptimpairments:25111}.   ACTIVITY LIMITATIONS: {activitylimitations:27494}  PARTICIPATION LIMITATIONS: {participationrestrictions:25113}  PERSONAL FACTORS: {Personal  factors:25162} are also affecting patient's functional outcome.   REHAB POTENTIAL: {rehabpotential:25112}  CLINICAL DECISION MAKING: {clinical decision making:25114}  EVALUATION COMPLEXITY: {Evaluation complexity:25115}   GOALS: Goals reviewed with patient? {yes/no:20286}  SHORT TERM GOALS: Target date: ***  *** Baseline: Goal status: {GOALSTATUS:25110}  2.  *** Baseline:  Goal status: {GOALSTATUS:25110}  3.  *** Baseline:  Goal status: {GOALSTATUS:25110}  4.  *** Baseline:  Goal status: {GOALSTATUS:25110}  5.  *** Baseline:  Goal status: {GOALSTATUS:25110}  6.  *** Baseline:  Goal status: {GOALSTATUS:25110}  LONG TERM GOALS: Target date: ***  *** Baseline:  Goal status: {GOALSTATUS:25110}  2.  *** Baseline:  Goal status: {GOALSTATUS:25110}  3.  *** Baseline:  Goal status: {GOALSTATUS:25110}  4.  *** Baseline:  Goal status: {GOALSTATUS:25110}  5.  *** Baseline:  Goal status: {GOALSTATUS:25110}  6.  *** Baseline:  Goal status: {GOALSTATUS:25110}  PLAN:  PT FREQUENCY: {rehab frequency:25116}  PT DURATION: {rehab duration:25117}  PLANNED INTERVENTIONS: {rehab planned interventions:25118::"Therapeutic exercises","Therapeutic activity","Neuromuscular re-education","Balance training","Gait training","Patient/Family education","Self Care","Joint mobilization"}.  PLAN FOR NEXT SESSION: ***   Sedalia Muta, PT 11/20/2022, 8:46 AM

## 2022-11-21 ENCOUNTER — Encounter: Payer: Self-pay | Admitting: Physical Therapy

## 2022-11-26 ENCOUNTER — Ambulatory Visit: Payer: Medicare PPO | Admitting: Physical Therapy

## 2022-11-26 ENCOUNTER — Encounter: Payer: Self-pay | Admitting: Physical Therapy

## 2022-11-26 DIAGNOSIS — M6281 Muscle weakness (generalized): Secondary | ICD-10-CM | POA: Diagnosis not present

## 2022-11-26 DIAGNOSIS — M5459 Other low back pain: Secondary | ICD-10-CM

## 2022-11-26 DIAGNOSIS — R2689 Other abnormalities of gait and mobility: Secondary | ICD-10-CM

## 2022-11-26 NOTE — Therapy (Signed)
OUTPATIENT PHYSICAL THERAPY THORACOLUMBAR TREATMENT   Patient Name: Samantha Snyder MRN: [id number] DOB:1945/07/21, 77 y.o., female Today's Date: 11/26/2022  END OF SESSION:  PT End of Session - 11/26/22 1351     Visit Number 2    Number of Visits 16    Date for PT Re-Evaluation 01/15/23    Authorization Type [employer]    PT Start Time 1352    PT Stop Time 1435    PT Time Calculation (min) 43 min    Activity Tolerance Patient tolerated treatment well    Behavior During Therapy WFL for tasks assessed/performed             Past Medical History:  Diagnosis Date   Allergy    minor hay fever   Arthritis    hands x 2, knee    Cataract    small   Chicken pox 02/24/2014   Has had shingles vaccine   Chronic diarrhea    For one year-needed prolonged course of antibiotics   Diabetes mellitus without complication (HCC)    Diverticulosis 02/24/2014   Emphysema of lung (HCC)    mild per pt. per x ray    History of UTI 2014   per pt noted after taking Bactrim due to tooth infection   Hyperlipidemia    Neuromuscular disorder (HCC)    neuropathy both feet    Osteopenia    Right adrenal mass (HCC)    Biopsied in 2008 and benign   Vaginal prolapse    Status post surgery. Improved urinary incontinence   Past Surgical History:  Procedure Laterality Date   ABDOMINAL HYSTERECTOMY  09/07/2013   ABDOMINAL SACROCOLPOPEXY  09/07/2013   BLADDER SURGERY  09/07/2013   vaginal prolapse   BREAST BIOPSY Right    2014? near nipple. benign.   COLONOSCOPY  11/06/2005   sig tics, no polyps-[facility]    CYSTOSCOPY  09/07/2013   ENTEROCELE REPAIR  09/07/2013   INDUCED ABORTION  1985   Therapeutic   LAPAROSCOPIC BILATERAL SALPINGO OOPHERECTOMY  09/07/2013   POLYPECTOMY     PUBOVAGINAL SLING  09/07/2013   TONSILLECTOMY Bilateral 1955   TUBAL LIGATION     Patient Active Problem List   Diagnosis Date Noted   History of subdural hematoma 10/05/2021   Emphysema lung (HCC) 12/19/2020    Aortic atherosclerosis (HCC) 07/08/2017   Diabetic peripheral neuropathy (HCC) 10/27/2014   Osteoporosis 03/30/2014   Essential hypertension 03/08/2014   DM (diabetes mellitus) type II controlled, neurological manifestation (HCC) 02/24/2014   Seasonal allergies 02/24/2014   Hyperlipidemia associated with type 2 diabetes mellitus (HCC) 02/24/2014   Tobacco abuse 02/24/2014    PCP: Tana Conch  REFERRING PROVIDER: Tana Conch  REFERRING DIAG: Low back pain  Rationale for Evaluation and Treatment: Rehabilitation  THERAPY DIAG:  Muscle weakness (generalized)  Other abnormalities of gait and mobility  Other low back pain  ONSET DATE: February 2024.  SUBJECTIVE:  SUBJECTIVE STATEMENT: 6/4: pt with no new complaints. Brought cane, shoes, socks, and to spacers to assess.  Eval: Pt saw PCP in February. Was picking up yard with reacher. Had back pain then, has improved some.  Was feeling shift/pop at that time., but reports minimal pain at this time. 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 States no problems, good recovery from this.  Has elliptical- seated. can't walk for exercise.   [place] [facility] for [place]. Also reports pain in R middle finger/OA 1 fall last year- in yard, had subdural hematoma. Did dissipate- now ok.   PERTINENT HISTORY:  Neuropathy, osteoporosis, Emphysema, DM, OA,   PAIN:  Are you having pain? Not in back, back just feels weak Yes:  Pain location: 8/10 in bil feet/lower legs. Pain description: Numb and tingly  Aggravating factors: none , hurts all the time.  Relieving factors: none,   PRECAUTIONS:  None  WEIGHT BEARING RESTRICTIONS: No  FALLS:  Has patient fallen in last 6 months? No  PLOF: Independent  PATIENT GOALS: improved walking, strength, ability for standing activity.   NEXT MD VISIT:   OBJECTIVE:   DIAGNOSTIC FINDINGS:    PATIENT SURVEYS:    COGNITION: Overall cognitive status: Within functional limits for tasks assessed     SENSATION: Light touch: Impaired for bil feet/lower legs   POSTURE:  Tendency for fwd trunk flexion, fwd head, rounded shoulders.  Feet: bil hallux valgus, pronation R>L, pes planus R > L, moderate toeing out bilaterally.   PALPATION: Lipomas 2-3 on L, 1 on R low lumbar R 2nd toe I- mallet toe- red sore.   LUMBAR ROM:   Hips: WFL,   Knees: WNL ,  Ankles: mild limitation for DF, mod limitation for Inv/Ev.   AROM eval  Flexion WFL  Extension Mild limitation  Right lateral flexion   Left lateral flexion   Right rotation   Left rotation    (Blank rows = not tested)     LOWER EXTREMITY MMT:    MMT Right eval Left eval  Hip flexion 3- 3-  Hip extension    Hip abduction 3- 3-  Hip adduction    Hip internal rotation    Hip external rotation    Knee flexion 4 4  Knee extension 4 4  Ankle dorsiflexion    Ankle plantarflexion    Ankle inversion    Ankle eversion     (Blank rows = not tested)  LUMBAR SPECIAL TESTS:    FUNCTIONAL TESTS:   TUG:  17.7 sec no AD.  GAIT: Distance walked: 173ft  Assistive device utilized: None Level of assistance: Complete Independence Comments: slow speed, increased lateral sway, mild toeing out, decreased push offf     TODAY'S TREATMENT:  DATE:    11/26/22:  Therapeutic Exercise: Aerobic: Supine: Seated:  pelvic tilts 2 x 10, with education on upright posture in seated position.   LAQ 2 x 10 bil;  Standing:  Stretches:  Neuromuscular  Re-education: Manual Therapy: Therapeutic Activity: Self Care:  education on toe sleeves and loops for R 2nd toe, discussed use of compression socks- will wait and try toe sleeves 1st, then may try compression socks in the next few weeks.  Assessed diabetic shoes- they are very stiff but fit foot well.    PATIENT EDUCATION:  Education details: PT POC, Exam findings, HEP Person educated: Patient Education method: Explanation, Demonstration, Tactile cues, Verbal cues, and Handouts Education comprehension: verbalized understanding, returned demonstration, verbal cues required, tactile cues required, and needs further education   HOME EXERCISE PROGRAM: Access Code: [email or web address] URL: [email or web address] Date: 11/26/2022 Prepared by: Sedalia Muta  Exercises - Seated Knee Extension AROM  - 1 x daily - 2 sets - 10 reps - Seated Anterior Pelvic Tilt  - 1 x daily - 1-2 sets - 10 reps  ASSESSMENT:  CLINICAL IMPRESSION: Reviewed compression sock and shoe wearing with pt today. She will bring sneakers back to future sessions to wear with activity. Assessed and discussed options for decreasing R 2nd toe pressure on IP. She has 3 options that she will re-try since it has been a while that she has tried these. Hopefully one of these would decrease redness and friction on joint for improved comfort. We will continue gait training with SPC next visit. Educated on part of initial HEP today, will continue to educate on exercises next visit for HEP.   Eval: Patient presents with primary complaint of decreased mobility. She had increased back pain that has mostly resolved. She has much weakness in hips and poor stability of LEs. She has neuropathy that is significantly contributing to unsteadiness on feet. She has  inefficient gait mechanics, and will likely benefit from education on use of SPC. Pt to benefit from skilled PT to improve deficits and pain.   OBJECTIVE IMPAIRMENTS: Abnormal gait,  decreased activity tolerance, decreased balance, decreased coordination, decreased knowledge of use of DME, decreased mobility, difficulty walking, decreased ROM, decreased strength, decreased safety awareness, and pain.   ACTIVITY LIMITATIONS: lifting, bending, standing, squatting, stairs, transfers, and locomotion level  PARTICIPATION LIMITATIONS: meal prep, cleaning, shopping, and community activity  PERSONAL FACTORS: 1 comorbidity: neuropathy  are also affecting patient's functional outcome.   REHAB POTENTIAL: Good  CLINICAL DECISION MAKING: Stable/uncomplicated  EVALUATION COMPLEXITY: Low   GOALS: Goals reviewed with patient? Yes  SHORT TERM GOALS: Target date: 12/04/2022  Pt to be independent with initial HEP  Goal status: INITIAL    LONG TERM GOALS: Target date: 01/15/2023  Pt to be independent with final HEP  Goal status: INITIAL  2.  Pt to demo improved hip strength to at least 4/5 to improve stability and gait.   Goal status: INITIAL  3.  Pt to demo dynamic balance to be [facility] for pt age and diagnosis.  Goal status: INITIAL  4.  Pt to demo improved gait mechanics to max ability,  with use of SPC.  Goal status: INITIAL  5.  Pt to demo improved TUG score by at least 3 seconds, to improve gait efficiency.    Goal status: INITIAL   PLAN:  PT FREQUENCY: 1-2x/week  PT DURATION: 8 weeks  PLANNED INTERVENTIONS: Therapeutic exercises, Therapeutic activity, Neuromuscular re-education, Patient/Family education, Self Care, Joint mobilization, Joint manipulation, Stair training, Orthotic/Fit  training, DME instructions, Aquatic Therapy, Dry Needling, Electrical stimulation, Cryotherapy, Moist heat, Taping, Ultrasound, Ionotophoresis 4mg /ml Dexamethasone, Manual therapy,  Vasopneumatic device, Traction, Spinal manipulation, Spinal mobilization,Balance training, Gait training,   PLAN FOR NEXT SESSION:  LE strength, hip knee, core strength,  standing balance, gait  training.   Sedalia Muta, PT, DPT 3:10 PM  11/26/22

## 2022-12-02 ENCOUNTER — Ambulatory Visit: Payer: Medicare PPO | Admitting: Physical Therapy

## 2022-12-02 ENCOUNTER — Encounter: Payer: Self-pay | Admitting: Physical Therapy

## 2022-12-02 DIAGNOSIS — M6281 Muscle weakness (generalized): Secondary | ICD-10-CM

## 2022-12-02 DIAGNOSIS — M5459 Other low back pain: Secondary | ICD-10-CM

## 2022-12-02 DIAGNOSIS — R2689 Other abnormalities of gait and mobility: Secondary | ICD-10-CM | POA: Diagnosis not present

## 2022-12-02 NOTE — Therapy (Addendum)
 OUTPATIENT PHYSICAL THERAPY THORACOLUMBAR TREATMENT   Patient Name: Samantha Snyder MRN: [id number] DOB:[birth year], 77 y.o., female Today's Date: 12/02/2022  END OF SESSION:  PT End of Session - 11/26/22 1351     Visit Number 2    Number of Visits 16    Date for PT Re-Evaluation 01/15/23    Authorization Type [employer]    PT Start Time 1352    PT Stop Time 1435    PT Time Calculation (min) 43 min    Activity Tolerance Patient tolerated treatment well    Behavior During Therapy WFL for tasks assessed/performed             Past Medical History:  Diagnosis Date   Allergy    minor hay fever   Arthritis    hands x 2, knee    Cataract    small   Chicken pox 02/24/2014   Has had shingles vaccine   Chronic diarrhea    For one year-needed prolonged course of antibiotics   Diabetes mellitus without complication (HCC)    Diverticulosis 02/24/2014   Emphysema of lung (HCC)    mild per pt. per x ray    History of UTI 2014   per pt noted after taking Bactrim due to tooth infection   Hyperlipidemia    Neuromuscular disorder (HCC)    neuropathy both feet    Osteopenia    Right adrenal mass (HCC)    Biopsied in 2008 and benign   Vaginal prolapse    Status post surgery. Improved urinary incontinence   Past Surgical History:  Procedure Laterality Date   ABDOMINAL HYSTERECTOMY  09/07/2013   ABDOMINAL SACROCOLPOPEXY  09/07/2013   BLADDER SURGERY  09/07/2013   vaginal prolapse   BREAST BIOPSY Right    2014? near nipple. benign.   COLONOSCOPY  11/06/2005   sig tics, no polyps-[facility]    CYSTOSCOPY  09/07/2013   ENTEROCELE REPAIR  09/07/2013   INDUCED ABORTION  1985   Therapeutic   LAPAROSCOPIC BILATERAL SALPINGO OOPHERECTOMY  09/07/2013   POLYPECTOMY     PUBOVAGINAL SLING  09/07/2013   TONSILLECTOMY Bilateral 1955   TUBAL LIGATION     Patient Active Problem List   Diagnosis Date Noted   History of subdural hematoma 10/05/2021   Emphysema lung (HCC) 12/19/2020    Aortic atherosclerosis (HCC) 07/08/2017   Diabetic peripheral neuropathy (HCC) 10/27/2014   Osteoporosis 03/30/2014   Essential hypertension 03/08/2014   DM (diabetes mellitus) type II controlled, neurological manifestation (HCC) 02/24/2014   Seasonal allergies 02/24/2014   Hyperlipidemia associated with type 2 diabetes mellitus (HCC) 02/24/2014   Tobacco abuse 02/24/2014    PCP: Tana Conch  REFERRING PROVIDER: Tana Conch  REFERRING DIAG: Low back pain  Rationale for Evaluation and Treatment: Rehabilitation  THERAPY DIAG:  Muscle weakness (generalized)  Other abnormalities of gait and mobility  Other low back pain  ONSET DATE: February 2024.  SUBJECTIVE:  SUBJECTIVE STATEMENT: 6/11: pt with no new complaints. Wearing taller compression socks today with good comfort. Not able to tolerate toe strap for R 2nd toe, thought it was rubbing.   Eval: Pt saw PCP in February. Was picking up yard with reacher. Had back pain then, has improved some.  Was feeling shift/pop at that time., but reports minimal pain at this time. 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 States no problems, good recovery from this.  Has elliptical- seated. can't walk for exercise.   Seeing [place] [facility]- for [place]. Also reports pain in R middle finger/OA 1 fall last year- in yard, had subdural hematoma. Did dissipate- now ok.   PERTINENT HISTORY:  Neuropathy, osteoporosis, Emphysema, DM, OA,   PAIN:  Are you having pain? Not in back, back just feels weak Yes:  Pain location: 8/10 in bil feet/lower legs. Pain description: Numb and tingly  Aggravating  factors: none , hurts all the time.  Relieving factors: none,   PRECAUTIONS: None  WEIGHT BEARING RESTRICTIONS: No  FALLS:  Has patient fallen in last 6 months? No  PLOF: Independent  PATIENT GOALS: improved walking, strength, ability for standing activity.   NEXT MD VISIT:   OBJECTIVE:   DIAGNOSTIC FINDINGS:    PATIENT SURVEYS:    COGNITION: Overall cognitive status: Within functional limits for tasks assessed     SENSATION: Light touch: Impaired for bil feet/lower legs   POSTURE:  Tendency for fwd trunk flexion, fwd head, rounded shoulders.  Feet: bil hallux valgus, pronation R>L, pes planus R > L, moderate toeing out bilaterally.   PALPATION: Lipomas 2-3 on L, 1 on R low lumbar R 2nd toe I- mallet toe- red sore.   LUMBAR ROM:   Hips: WFL,   Knees: WNL ,  Ankles: mild limitation for DF, mod limitation for Inv/Ev.   AROM eval  Flexion WFL  Extension Mild limitation  Right lateral flexion   Left lateral flexion   Right rotation   Left rotation    (Blank rows = not tested)     LOWER EXTREMITY MMT:    MMT Right eval Left eval  Hip flexion 3- 3-  Hip extension    Hip abduction 3- 3-  Hip adduction    Hip internal rotation    Hip external rotation    Knee flexion 4 4  Knee extension 4 4  Ankle dorsiflexion    Ankle plantarflexion    Ankle inversion    Ankle eversion     (Blank rows = not tested)  LUMBAR SPECIAL TESTS:    FUNCTIONAL TESTS:   TUG:  17.7 sec no AD.  GAIT: Distance walked: 168ft  Assistive device utilized: None Level of assistance: Complete Independence Comments: slow speed, increased lateral sway, mild toeing out, decreased push offf     TODAY'S TREATMENT:  DATE:    12/02/22: Therapeutic Exercise: Aerobic: Supine: Seated:  pelvic tilts 2 x 10, with education on upright posture in seated  position.   LAQ 2 x 10 bil;  HR/TR x 15;   Sit to stand with education on mechanics, higher mat table 3 x 5;  Standing: Marching 2 x 10; Ambulation with SPC, education on use, safety  and sequencing Stretches:  Neuromuscular Re-education: Manual Therapy: Therapeutic Activity: Self Care:     11/26/22 Therapeutic Exercise: Aerobic: Supine: Seated:  pelvic tilts 2 x 10, with education on upright posture in seated position.   LAQ 2 x 10 bil;  Standing:  Stretches:  Neuromuscular Re-education: Manual Therapy: Therapeutic Activity: Self Care:  education on toe sleeves and loops for R 2nd toe, discussed use of compression socks- will wait and try toe sleeves 1st, then may try compression socks in the next few weeks.  Assessed diabetic shoes- they are very stiff but fit foot well.    PATIENT EDUCATION:  Education details: PT POC, Exam findings, HEP Person educated: Patient Education method: Explanation, Demonstration, Tactile cues, Verbal cues, and Handouts Education comprehension: verbalized understanding, returned demonstration, verbal cues required, tactile cues required, and needs further education   HOME EXERCISE PROGRAM: Access Code: [email or web address]   ASSESSMENT:  CLINICAL IMPRESSION: Pt with improving ability for ambulation with SPC, but will benefit from continued work with this. She does have improved gait mechanics with use of cane. She is wearing tall compression socks today with good fit and comfort, she will continue to try to wear.  She will benefit from progressive strengthening and standing stability/balance and gait.   Eval: Patient presents with primary complaint of decreased mobility. She had increased back pain that has mostly resolved. She has much weakness in hips and poor stability of LEs. She has neuropathy that is significantly contributing to unsteadiness on feet. She has  inefficient gait mechanics, and will likely benefit from education on use of SPC. Pt to benefit  from skilled PT to improve deficits and pain.   OBJECTIVE IMPAIRMENTS: Abnormal gait, decreased activity tolerance, decreased balance, decreased coordination, decreased knowledge of use of DME, decreased mobility, difficulty walking, decreased ROM, decreased strength, decreased safety awareness, and pain.   ACTIVITY LIMITATIONS: lifting, bending, standing, squatting, stairs, transfers, and locomotion level  PARTICIPATION LIMITATIONS: meal prep, cleaning, shopping, and community activity  PERSONAL FACTORS: 1 comorbidity: neuropathy  are also affecting patient's functional outcome.   REHAB POTENTIAL: Good  CLINICAL DECISION MAKING: Stable/uncomplicated  EVALUATION COMPLEXITY: Low   GOALS: Goals reviewed with patient? Yes  SHORT TERM GOALS: Target date: 12/04/2022  Pt to be independent with initial HEP  Goal status: INITIAL    LONG TERM GOALS: Target date: 01/15/2023  Pt to be independent with final HEP  Goal status: INITIAL  2.  Pt to demo improved hip strength to at least 4/5 to improve stability and gait.   Goal status: INITIAL  3.  Pt to demo dynamic balance to be [facility] for pt age and diagnosis.  Goal status: INITIAL  4.  Pt to demo improved gait mechanics to max ability,  with use of SPC.  Goal status: INITIAL  5.  Pt to demo improved TUG score by at least 3 seconds, to improve gait efficiency.    Goal status: INITIAL   PLAN:  PT FREQUENCY: 1-2x/week  PT DURATION: 8 weeks  PLANNED INTERVENTIONS: Therapeutic exercises, Therapeutic activity, Neuromuscular re-education, Patient/Family education, Self Care, Joint mobilization, Joint manipulation, Stair training, Orthotic/Fit training,  DME instructions, Aquatic Therapy, Dry Needling, Electrical stimulation, Cryotherapy, Moist heat, Taping, Ultrasound, Ionotophoresis 4mg /ml Dexamethasone, Manual therapy,  Vasopneumatic device, Traction, Spinal manipulation, Spinal mobilization,Balance training, Gait  training,   PLAN FOR NEXT SESSION:  LE strength, hip knee, core strength,  standing balance, gait training.   Sedalia Muta, PT, DPT 3:10 PM  11/26/22   PHYSICAL THERAPY DISCHARGE SUMMARY  Visits from Start of Care: 2   Plan: Patient agrees to discharge.  Patient goals were not met. Patient is being discharged due to- not returning to PT after visit 2   Sedalia Muta, PT, DPT 12:32 PM  09/08/23

## 2022-12-03 ENCOUNTER — Ambulatory Visit: Payer: Medicare PPO | Admitting: Physician Assistant

## 2022-12-03 ENCOUNTER — Encounter: Payer: Self-pay | Admitting: Physician Assistant

## 2022-12-03 VITALS — BP 130/78 | HR 80 | Temp 97.3°F | Ht 69.0 in | Wt 187.8 lb

## 2022-12-03 DIAGNOSIS — L309 Dermatitis, unspecified: Secondary | ICD-10-CM | POA: Diagnosis not present

## 2022-12-03 DIAGNOSIS — S21202A Unspecified open wound of left back wall of thorax without penetration into thoracic cavity, initial encounter: Secondary | ICD-10-CM

## 2022-12-03 MED ORDER — TRIAMCINOLONE ACETONIDE 0.1 % EX CREA: 1.0000 | TOPICAL_CREAM | Freq: Two times a day (BID) | CUTANEOUS | 0 refills | Status: DC

## 2022-12-03 NOTE — Patient Instructions (Signed)
You've done a really good job with this wound! Looks very well healed. Continue to wash with soap and water, pat dry. Let open, no bandages needed.  Use the triamcinolone cream on the irritation surrounding the wound as directed.  Monitor if any worse redness or pus or fever, etc, recheck if that's the case.

## 2022-12-03 NOTE — Progress Notes (Signed)
Subjective:    Patient ID: Samantha Snyder, female    DOB: 08/30/1945, 77 y.o.   MRN: [id number]  Chief Complaint  Patient presents with   Burn    Pt burned herself on left lower back with heating pad end of May, been dressing and taking care at home but needing it to be looked at. No pus drainage, and no neosporin for past few days.  Pt has diabetes but takes longer to heal.    HPI Patient is in today for burn wound from heating pad left lower back x several weeks ago. She has progression of wound photos on her phone. Feels like it is healing ok, but now having irritation from bandages. Looking for advice to take care of healing it entirely today.   Past Medical History:  Diagnosis Date   Allergy    minor hay fever   Arthritis    hands x 2, knee    Cataract    small   Chicken pox 02/24/2014   Has had shingles vaccine   Chronic diarrhea    For one year-needed prolonged course of antibiotics   Diabetes mellitus without complication (HCC)    Diverticulosis 02/24/2014   Emphysema of lung (HCC)    mild per pt. per x ray    History of UTI 2014   per pt noted after taking Bactrim due to tooth infection   Hyperlipidemia    Neuromuscular disorder (HCC)    neuropathy both feet    Osteopenia    Right adrenal mass (HCC)    Biopsied in 2008 and benign   Vaginal prolapse    Status post surgery. Improved urinary incontinence    Past Surgical History:  Procedure Laterality Date   ABDOMINAL HYSTERECTOMY  09/07/2013   ABDOMINAL SACROCOLPOPEXY  09/07/2013   BLADDER SURGERY  09/07/2013   vaginal prolapse   BREAST BIOPSY Right    2014? near nipple. benign.   COLONOSCOPY  11/06/2005   sig tics, no polyps[facility]    CYSTOSCOPY  09/07/2013   ENTEROCELE REPAIR  09/07/2013   INDUCED ABORTION  1985   Therapeutic   LAPAROSCOPIC BILATERAL SALPINGO OOPHERECTOMY  09/07/2013   POLYPECTOMY     PUBOVAGINAL SLING  09/07/2013   TONSILLECTOMY Bilateral 1955   TUBAL LIGATION       Family History  Problem Relation Age of Onset   Cancer Father        Lung but was a smoker   Stroke Father        75 massive lead to death   Diabetes Father    Bladder Cancer Father    Lung cancer Father    Osteoporosis Mother    Anuerysm Mother    Diabetes Maternal Grandmother    Colon cancer Neg Hx    Colon polyps Neg Hx    Esophageal cancer Neg Hx    Rectal cancer Neg Hx    Stomach cancer Neg Hx    Breast cancer Neg Hx     Social History   Tobacco Use   Smoking status: Every Day    Packs/day: 1.00    Years: 53.00    Additional pack years: 0.00    Total pack years: 53.00    Types: Cigarettes   Smokeless tobacco: Never   Tobacco comments:    Counseled that the single most powerful action she could take to decrease her risk of lung cancer is to quit smoking  Substance Use Topics   Alcohol use: No  Alcohol/week: 0.0 standard drinks of alcohol   Drug use: No     Allergies  Allergen Reactions   Fluoride Preparations Nausea Only    Patient states it burned her mouth and she had abdominal pain for 24-36 hours (dental office)    Review of Systems NEGATIVE UNLESS OTHERWISE INDICATED IN HPI      Objective:     BP 130/78 (BP Location: Left Arm)   Pulse 80   Temp (!) 97.3 F (36.3 C) (Temporal)   Ht 5\' 9"  (1.753 m)   Wt 187 lb 12.8 oz (85.2 kg)   LMP  (LMP Unknown)   SpO2 98%   BMI 27.73 kg/m   Wt Readings from Last 3 Encounters:  12/03/22 187 lb 12.8 oz (85.2 kg)  08/14/22 185 lb 9.6 oz (84.2 kg)  04/08/22 188 lb (85.3 kg)    BP Readings from Last 3 Encounters:  12/03/22 130/78  08/14/22 122/78  06/28/22 130/67     Physical Exam  See photo below of left lower back:      Assessment & Plan:  Wound of left side of back, initial encounter  Dermatitis  Other orders -     Triamcinolone Acetonide; Apply 1 Application topically 2 (two) times daily. For 7-10 days maximum  Dispense: 45 g; Refill: 0   Looks very well healed. No signs of  infection.  Continue to wash with soap and water, pat dry. Leave open, no bandages needed.  Use the triamcinolone cream on the irritation from bandages surrounding the wound as directed.  Monitor if any worse redness or pus or fever, etc, recheck if that's the case.     Return if symptoms worsen or fail to improve.    Torrence Hammack M Kayse Puccini, PA-C

## 2022-12-09 DIAGNOSIS — D485 Neoplasm of uncertain behavior of skin: Secondary | ICD-10-CM | POA: Diagnosis not present

## 2022-12-09 DIAGNOSIS — L988 Other specified disorders of the skin and subcutaneous tissue: Secondary | ICD-10-CM | POA: Diagnosis not present

## 2022-12-09 DIAGNOSIS — D1724 Benign lipomatous neoplasm of skin and subcutaneous tissue of left leg: Secondary | ICD-10-CM | POA: Diagnosis not present

## 2022-12-09 DIAGNOSIS — L821 Other seborrheic keratosis: Secondary | ICD-10-CM | POA: Diagnosis not present

## 2022-12-11 ENCOUNTER — Encounter: Payer: Medicare PPO | Admitting: Physical Therapy

## 2022-12-12 ENCOUNTER — Ambulatory Visit (HOSPITAL_BASED_OUTPATIENT_CLINIC_OR_DEPARTMENT_OTHER)
Admission: RE | Admit: 2022-12-12 | Discharge: 2022-12-12 | Disposition: A | Discharge status: Home / Self Care | Payer: Medicare PPO | Source: Ambulatory Visit | Attending: Acute Care | Admitting: Acute Care

## 2022-12-12 DIAGNOSIS — Z122 Encounter for screening for malignant neoplasm of respiratory organs: Secondary | ICD-10-CM | POA: Insufficient documentation

## 2022-12-12 DIAGNOSIS — F17210 Nicotine dependence, cigarettes, uncomplicated: Secondary | ICD-10-CM | POA: Diagnosis not present

## 2022-12-12 DIAGNOSIS — Z87891 Personal history of nicotine dependence: Secondary | ICD-10-CM

## 2022-12-16 ENCOUNTER — Encounter: Payer: Medicare PPO | Admitting: Physical Therapy

## 2022-12-17 ENCOUNTER — Other Ambulatory Visit: Payer: Self-pay | Admitting: Acute Care

## 2022-12-17 DIAGNOSIS — F17210 Nicotine dependence, cigarettes, uncomplicated: Secondary | ICD-10-CM

## 2022-12-17 DIAGNOSIS — Z87891 Personal history of nicotine dependence: Secondary | ICD-10-CM

## 2022-12-17 DIAGNOSIS — Z122 Encounter for screening for malignant neoplasm of respiratory organs: Secondary | ICD-10-CM

## 2022-12-18 ENCOUNTER — Encounter: Payer: Medicare PPO | Admitting: Physical Therapy

## 2023-01-19 ENCOUNTER — Other Ambulatory Visit: Payer: Self-pay | Admitting: Family Medicine

## 2023-01-22 ENCOUNTER — Encounter (INDEPENDENT_AMBULATORY_CARE_PROVIDER_SITE_OTHER): Payer: Self-pay

## 2023-02-07 ENCOUNTER — Ambulatory Visit: Payer: Medicare PPO | Admitting: Family Medicine

## 2023-02-07 ENCOUNTER — Encounter: Payer: Self-pay | Admitting: Family Medicine

## 2023-02-07 VITALS — BP 120/82 | HR 72 | Temp 97.0°F | Ht 68.0 in | Wt 184.0 lb

## 2023-02-07 DIAGNOSIS — M810 Age-related osteoporosis without current pathological fracture: Secondary | ICD-10-CM

## 2023-02-07 DIAGNOSIS — E1169 Type 2 diabetes mellitus with other specified complication: Secondary | ICD-10-CM

## 2023-02-07 DIAGNOSIS — E1142 Type 2 diabetes mellitus with diabetic polyneuropathy: Secondary | ICD-10-CM

## 2023-02-07 DIAGNOSIS — Z720 Tobacco use: Secondary | ICD-10-CM

## 2023-02-07 DIAGNOSIS — I10 Essential (primary) hypertension: Secondary | ICD-10-CM

## 2023-02-07 DIAGNOSIS — E785 Hyperlipidemia, unspecified: Secondary | ICD-10-CM

## 2023-02-07 LAB — COMPREHENSIVE METABOLIC PANEL
ALT: 14 U/L (ref 0–35)
AST: 14 U/L (ref 0–37)
Albumin: 4.2 g/dL (ref 3.5–5.2)
Alkaline Phosphatase: 79 U/L (ref 39–117)
BUN: 23 mg/dL (ref 6–23)
CO2: 27 meq/L (ref 19–32)
Calcium: 9.5 mg/dL (ref 8.4–10.5)
Chloride: 102 meq/L (ref 96–112)
Creatinine, Ser: 0.64 mg/dL (ref 0.40–1.20)
GFR: 86.4 mL/min (ref >60.00)
Glucose, Bld: 102 mg/dL — ABNORMAL HIGH (ref 70–99)
Potassium: 4 meq/L (ref 3.5–5.1)
Sodium: 135 meq/L (ref 135–145)
Total Bilirubin: 0.3 mg/dL (ref 0.2–1.2)
Total Protein: 6.7 g/dL (ref 6.0–8.3)

## 2023-02-07 LAB — CBC WITH DIFFERENTIAL/PLATELET
Basophils Absolute: 0.1 10*3/uL (ref 0.0–0.1)
Basophils Relative: 0.8 % (ref 0.0–3.0)
Eosinophils Absolute: 0.1 10*3/uL (ref 0.0–0.7)
Eosinophils Relative: 1.4 % (ref 0.0–5.0)
HCT: 46 % (ref 36.0–46.0)
Hemoglobin: 14.9 g/dL (ref 12.0–15.0)
Lymphocytes Relative: 20.6 % (ref 12.0–46.0)
Lymphs Abs: 1.7 10*3/uL (ref 0.7–4.0)
MCHC: 32.3 g/dL (ref 30.0–36.0)
MCV: 92.5 fl (ref 78.0–100.0)
Monocytes Absolute: 0.6 10*3/uL (ref 0.1–1.0)
Monocytes Relative: 7.4 % (ref 3.0–12.0)
Neutro Abs: 5.8 10*3/uL (ref 1.4–7.7)
Neutrophils Relative %: 69.8 % (ref 43.0–77.0)
Platelets: 344 10*3/uL (ref 150.0–400.0)
RBC: 4.98 Mil/uL (ref 3.87–5.11)
RDW: 14.8 % (ref 11.5–15.5)
WBC: 8.3 10*3/uL (ref 4.0–10.5)

## 2023-02-07 LAB — MICROALBUMIN / CREATININE URINE RATIO
Creatinine,U: 40.7 mg/dL
Microalb Creat Ratio: 2.3 mg/g (ref 0.0–30.0)
Microalb, Ur: 1 mg/dL (ref 0.0–1.9)

## 2023-02-07 NOTE — Addendum Note (Signed)
Addended by: Shelva Majestic on: 02/07/2023 05:48 PM   Modules accepted: Level of Service

## 2023-02-07 NOTE — Patient Instructions (Addendum)
Let us know when you get your flu and COVID vaccines.  Please stop by lab before you go If you have mychart- we will send your results within 3 business days of [place] receiving them.  If you do not have mychart- we will call you about results within 5 business days of [place] receiving them.  *please also note that you will see labs on mychart as soon as they post. I will later go in and write notes on them- will say "notes from Dr. Durene Cal"   Recommended follow up: Return in about 6 months (around 08/10/2023) for physical or sooner if needed.Schedule b4 you leave.

## 2023-02-07 NOTE — Progress Notes (Signed)
Phone [phone] In person visit   Subjective:   Samantha Snyder is a 77 y.o. year old very pleasant female patient who presents for/with See problem oriented charting Chief Complaint  Patient presents with   Medical Management of Chronic Issues    Wants UACR panel done   Diabetes    Doing elliptical  for exercise   Hypertension    Past Medical History-  Patient Active Problem List   Diagnosis Date Noted   History of subdural hematoma 10/05/2021    Priority: High   DM (diabetes mellitus) type II controlled, neurological manifestation (HCC) 02/24/2014    Priority: High   Tobacco abuse 02/24/2014    Priority: High   Aortic atherosclerosis (HCC) 07/08/2017    Priority: Medium    Osteoporosis 03/30/2014    Priority: Medium    Essential hypertension 03/08/2014    Priority: Medium    Hyperlipidemia associated with type 2 diabetes mellitus (HCC) 02/24/2014    Priority: Medium    Emphysema lung (HCC) 12/19/2020    Priority: Low   Diabetic peripheral neuropathy (HCC) 10/27/2014    Priority: Low   Seasonal allergies 02/24/2014    Priority: Low    Medications- reviewed and updated Current Outpatient Medications  Medication Sig Dispense Refill   acetaminophen (TYLENOL) 500 MG tablet Take 500 mg by mouth in the morning and at bedtime.     Alcohol Swabs (B-D SINGLE USE SWABS REGULAR) PADS Use to cleanse are before checking blood sugar. Dx: E11.9 400 each 3   alendronate (FOSAMAX) 70 MG tablet TAKE 1 TABLET BY MOUTH EVERY 7 DAYS. TAKE WITH A FULL GLASS OF WATER ON AN EMPTY STOMACH. 12 tablet 3   Alpha-Lipoic Acid 600 MG CAPS TAKE 1 CAPSULE (600 MG TOTAL) BY MOUTH DAILY 60 capsule 5   atorvastatin (LIPITOR) 20 MG tablet TAKE 1 TABLET EVERY DAY 90 tablet 3   Blood Glucose Monitoring Suppl (TRUE METRIX AIR GLUCOSE METER) w/Device KIT USE TO TEST BLOOD SUGARS DAILY 1 kit 3   Calcium Carbonate (CALCIUM 600 PO) Take 1,200 mg by mouth at bedtime.     Cholecalciferol (VITAMIN D3)  2000 UNITS TABS Take 2,000 Units by mouth at bedtime.     diclofenac Sodium (VOLTAREN) 1 % GEL Apply 2 g topically 4 (four) times daily as needed (to painful sites of feet and hands). 400 g 5   fexofenadine (ALLEGRA) 180 MG tablet Take 180 mg by mouth daily.     glucose blood (TRUE METRIX BLOOD GLUCOSE TEST) test strip TEST BLOOD SUGAR UP TO FOUR TIMES DAILY 350 strip 3   lisinopril (ZESTRIL) 5 MG tablet TAKE 1 TABLET EVERY DAY 90 tablet 3   Magnesium 500 MG CAPS Take 500 mg by mouth at bedtime.     metFORMIN (GLUCOPHAGE) 1000 MG tablet TAKE 1 TABLET TWICE DAILY WITH MEALS 180 tablet 3   Multiple Vitamins-Minerals (HAIR SKIN AND NAILS FORMULA PO) Take 2 tablets by mouth daily.     Multiple Vitamins-Minerals (MULTIVITAMIN PO) Take 1 tablet by mouth at bedtime.     Omega-3 Fatty Acids (FISH OIL PO) Take 2,000 mg by mouth in the morning and at bedtime.     OVER THE COUNTER MEDICATION Take 240 mLs by mouth See admin instructions. Herbal tea- Drink 8 ounces by mouth daily     Probiotic Product (PROBIOTIC DAILY PO) Take 1 capsule by mouth every evening.     Semaglutide,0.25 or 0.5MG /DOS, (OZEMPIC, 0.25 OR 0.5 MG/DOSE,) 2 MG/3ML SOPN INJECT 0.5MG   AS DIRECTED ONCE WEEKLY 3 mL 5   TRUEplus Lancets 33G MISC TEST BLOOD SUGAR UP TO FOUR TIMES DAILY 400 each 3   triamcinolone cream (KENALOG) 0.1 % Apply 1 Application topically 2 (two) times daily. For 7-10 days maximum 45 g 0   No current facility-administered medications for this visit.     Objective:  BP 120/82   Pulse 72   Temp (!) 97 F (36.1 C)   Ht 5\' 8"  (1.727 m)   Wt 184 lb (83.5 kg)   LMP  (LMP Unknown)   SpO2 97%   BMI 27.98 kg/m  Gen: NAD, resting comfortably CV: RRR no murmurs rubs or gallops Lungs: CTAB no crackles, wheeze, rhonchi Ext: no edema Skin: warm, dry     Assessment and Plan   #social update- beach trip in September  #Lipoma- had removal   #dermatology - upcoming skin check in next few weeks with dermatology  associates  #hypertension S: medication: Lisinopril 5Mg  BP Readings from Last 3 Encounters:  02/07/23 120/82  12/03/22 130/78  08/14/22 122/78  A/P: stable- continue current medicines   #hyperlipidemia #Aortic atherosclerosis-LDL goal under 70.  Incidental finding on imaging S: Medication: Atorvastatin 20Mg  -Prior on aspirin 81 mg for primary prevention-stopped after subdural hematoma April 2023 Lab Results  Component Value Date   CHOL 132 08/14/2022   HDL 47.10 08/14/2022   LDLCALC 58 08/14/2022   LDLDIRECT 73.0 03/11/2017   TRIG 133.0 08/14/2022   CHOLHDL 3 08/14/2022   A/P:  well controlled continue current medications   # Diabetes-A1c typically under 7 #Diabetic neuropathy-alpha lipoic acid generally helpful, also on voltaren gel.  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 Also helps joint pain Lab Results  Component Value Date   HGBA1C 6.4 08/14/2022   HGBA1C 6.7 (H) 02/01/2022   HGBA1C 6.9 (H) 08/03/2021  A/P: hopefully stable- update a1c today. Continue current meds for now  Diabetes neuropathy- still using Voltaren gel and aloph lipoic acid  #Tobacco abuse/COPD S:1 ppd A/P: encouraged smoking cessation again- one of healthiest changes she could make  - in lung cancer screening program  #Osteoporosis S: Medication: Fosamax 70 mg started May 2022 Last DEXA: 10/26/2020 worst T score -2 point slightly worsened from -1.8 in 2019 at lumbar spine. Hip fracture risk at 4.9% low placing her in range to consider bisphosphonate with risk over 3%. Total fracture risk still only around 13% below threshold for treatment. -unfortunately DEXA 2024 was on different machine- we will stick with elam now- worst to score -1.4  Calcium: 1200mg   (through diet ok) recommended -taking Vitamin D: 1000 units a day recommended-taking A/P: osteoporosis stable- continue current medicines. Check calcium today as she had to reduce from 2400mg  to 1200mg    Recommended follow up: Return in about 6 months (around 08/10/2023) for physical or sooner if needed.Schedule b4 you leave. Future Appointments  Date Time Provider Department Center  04/14/2023  1:30 PM [facility] ANNUAL WELLNESS VISIT 1 [facility]    Lab/Order associations:   ICD-10-CM   1. Controlled type 2 diabetes mellitus with diabetic polyneuropathy, without long-term current use of insulin (HCC)  E11.42 Comprehensive metabolic panel    CBC with Differential/Platelet    Hemoglobin A1c    Microalbumin / creatinine urine ratio  2. Tobacco abuse  Z72.0     3. Hyperlipidemia associated with type 2 diabetes mellitus (HCC)  E11.69    E78.5     4. Essential hypertension  I10     5. Age-related osteoporosis without current pathological fracture  M81.0      No orders of the defined types were placed in this encounter.  Return precautions advised.  Tana Conch, MD

## 2023-02-10 ENCOUNTER — Encounter: Payer: Self-pay | Admitting: Family Medicine

## 2023-02-10 DIAGNOSIS — L578 Other skin changes due to chronic exposure to nonionizing radiation: Secondary | ICD-10-CM | POA: Diagnosis not present

## 2023-02-10 DIAGNOSIS — L814 Other melanin hyperpigmentation: Secondary | ICD-10-CM | POA: Diagnosis not present

## 2023-02-10 DIAGNOSIS — L821 Other seborrheic keratosis: Secondary | ICD-10-CM | POA: Diagnosis not present

## 2023-02-10 DIAGNOSIS — D1801 Hemangioma of skin and subcutaneous tissue: Secondary | ICD-10-CM | POA: Diagnosis not present

## 2023-02-10 DIAGNOSIS — D2271 Melanocytic nevi of right lower limb, including hip: Secondary | ICD-10-CM | POA: Diagnosis not present

## 2023-02-10 LAB — HEMOGLOBIN A1C: Hgb A1c MFr Bld: 6.5 % (ref 4.6–6.5)

## 2023-02-26 ENCOUNTER — Encounter: Payer: Self-pay | Admitting: Family Medicine

## 2023-03-01 ENCOUNTER — Other Ambulatory Visit: Payer: Self-pay | Admitting: Family Medicine

## 2023-03-24 ENCOUNTER — Other Ambulatory Visit: Payer: Self-pay | Admitting: Family Medicine

## 2023-03-24 DIAGNOSIS — Z1231 Encounter for screening mammogram for malignant neoplasm of breast: Secondary | ICD-10-CM

## 2023-04-14 ENCOUNTER — Ambulatory Visit (INDEPENDENT_AMBULATORY_CARE_PROVIDER_SITE_OTHER): Payer: Medicare PPO

## 2023-04-14 VITALS — Wt 184.0 lb

## 2023-04-14 DIAGNOSIS — Z0000 Encounter for general adult medical examination without abnormal findings: Secondary | ICD-10-CM | POA: Diagnosis not present

## 2023-04-14 NOTE — Patient Instructions (Signed)
Samantha Snyder , Thank you for taking time to come for your Medicare Wellness Visit. I appreciate your ongoing commitment to your health goals. Please review the following plan we discussed and let me know if I can assist you in the future.   Referrals/Orders/Follow-Ups/Clinician Recommendations: Aim for 30 minutes of exercise or brisk walking, 6-8 glasses of water, and 5 servings of fruits and vegetables each day. Continue with a healthy diet  If you wish to quit smoking, help is available. For free tobacco cessation program offerings call the [facility] at (276)581-2824 or Live Well Line at 208-346-5735. You may also visit [email or web address] or email [email or web address] for more information on other programs.   You may also call 1-800-QUIT-NOW (2048103410) or visit www.[place].ch or www.[email or web address] for additional resources on smoking cessation.    This is a list of the screening recommended for you and due dates:  Health Maintenance  Topic Date Due   COVID-19 Vaccine (9 - 2023-24 season) 04/22/2023   Eye exam for diabetics  06/21/2023   Hemoglobin A1C  08/10/2023   Complete foot exam   08/15/2023   Screening for Lung Cancer  12/12/2023   Colon Cancer Screening  01/21/2024   Yearly kidney function blood test for diabetes  02/07/2024   Yearly kidney health urinalysis for diabetes  02/07/2024   Medicare Annual Wellness Visit  04/13/2024   DTaP/Tdap/Td vaccine (5 - Td or Tdap) 03/07/2026   Pneumonia Vaccine  Completed   Flu Shot  Completed   DEXA scan (bone density measurement)  Completed   Hepatitis C Screening  Completed   Zoster (Shingles) Vaccine  Completed   HPV Vaccine  Aged Out    Advanced directives: (In Chart) A copy of your advanced directives are scanned into your chart should your provider ever need it.  Next Medicare Annual Wellness Visit scheduled for next year: Yes

## 2023-04-14 NOTE — Progress Notes (Signed)
Subjective:   Samantha Snyder is a 77 y.o. female who presents for Medicare Annual (Subsequent) preventive examination.  Visit Complete: Virtual I connected with  Clare Gandy on 04/14/23 by a audio enabled telemedicine application and verified that I am speaking with the correct person using two identifiers.  Patient Location: Home  Provider Location: Office/Clinic  I discussed the limitations of evaluation and management by telemedicine. The patient expressed understanding and agreed to proceed.  Vital Signs: Because this visit was a virtual/telehealth visit, some criteria may be missing or patient reported. Any vitals not documented were not able to be obtained and vitals that have been documented are patient reported.  Patient Medicare AWV questionnaire was completed by the patient on 04/10/23; I have confirmed that all information answered by patient is correct and no changes since this date.  Cardiac Risk Factors include: advanced age (>57men, >42 women);hypertension;dyslipidemia;diabetes mellitus     Objective:    Today's Vitals   04/14/23 1328  Weight: 184 lb (83.5 kg)   Body mass index is 27.98 kg/m.     04/14/2023    1:41 PM 11/21/2022   12:23 PM 04/08/2022    1:32 PM 10/16/2021   11:00 AM 10/05/2021    8:18 PM 10/02/2021   10:34 PM 03/26/2021    1:07 PM  Advanced Directives  Does Patient Have a Medical Advance Directive? Yes No Yes Yes Yes No;Yes Yes  Type of Estate agent of [place];Living will  Healthcare Power of [place];Living will Healthcare Power of [place];Living will Living will;Healthcare Power of [employer] Power of Attorney  Does patient want to make changes to medical advance directive? No - Patient declined  No - Patient declined  No - Patient declined    Copy of Healthcare Power of Attorney in Chart? Yes - validated most recent copy scanned in chart (See row information)  Yes - validated most recent copy scanned  in chart (See row information)    Yes - validated most recent copy scanned in chart (See row information)  Would patient like information on creating a medical advance directive? No - Patient declined No - Patient declined         Current Medications (verified) Outpatient Encounter Medications as of 04/14/2023  Medication Sig   acetaminophen (TYLENOL) 500 MG tablet Take 500 mg by mouth in the morning and at bedtime.   Alcohol Swabs (B-D SINGLE USE SWABS REGULAR) PADS Use to cleanse are before checking blood sugar. Dx: E11.9   alendronate (FOSAMAX) 70 MG tablet TAKE 1 TABLET BY MOUTH EVERY 7 DAYS. TAKE WITH A FULL GLASS OF WATER ON AN EMPTY STOMACH.   Alpha-Lipoic Acid 600 MG CAPS TAKE 1 CAPSULE (600 MG TOTAL) BY MOUTH DAILY   atorvastatin (LIPITOR) 20 MG tablet TAKE 1 TABLET EVERY DAY   Blood Glucose Monitoring Suppl (TRUE METRIX AIR GLUCOSE METER) w/Device KIT USE TO TEST BLOOD SUGARS DAILY   Calcium Carbonate (CALCIUM 600 PO) Take 1,200 mg by mouth at bedtime.   Cholecalciferol (VITAMIN D3) 2000 UNITS TABS Take 2,000 Units by mouth at bedtime.   diclofenac Sodium (VOLTAREN) 1 % GEL Apply 2 g topically 4 (four) times daily as needed (to painful sites of feet and hands).   fexofenadine (ALLEGRA) 180 MG tablet Take 180 mg by mouth daily.   glucose blood (TRUE METRIX BLOOD GLUCOSE TEST) test strip TEST BLOOD SUGAR UP TO FOUR TIMES DAILY   lisinopril (ZESTRIL) 5 MG tablet TAKE 1 TABLET EVERY  DAY   Magnesium 500 MG CAPS Take 500 mg by mouth at bedtime.   metFORMIN (GLUCOPHAGE) 1000 MG tablet TAKE 1 TABLET TWICE DAILY WITH MEALS   Multiple Vitamins-Minerals (HAIR SKIN AND NAILS FORMULA PO) Take 2 tablets by mouth daily.   Multiple Vitamins-Minerals (MULTIVITAMIN PO) Take 1 tablet by mouth at bedtime.   Omega-3 Fatty Acids (FISH OIL PO) Take 2,000 mg by mouth in the morning and at bedtime.   OVER THE COUNTER MEDICATION Take 240 mLs by mouth See admin instructions. Herbal tea- Drink 8 ounces by  mouth daily   Probiotic Product (PROBIOTIC DAILY PO) Take 1 capsule by mouth every evening.   Semaglutide,0.25 or 0.5MG /DOS, (OZEMPIC, 0.25 OR 0.5 MG/DOSE,) 2 MG/3ML SOPN INJECT 0.5MG  AS DIRECTED ONCE WEEKLY   TRUEplus Lancets 33G MISC TEST BLOOD SUGAR UP TO FOUR TIMES DAILY   No facility-administered encounter medications on file as of 04/14/2023.    Allergies (verified) Fluoride preparations   History: Past Medical History:  Diagnosis Date   Allergy    minor hay fever   Arthritis    hands x 2, knee    Cataract    small   Chicken pox 02/24/2014   Has had shingles vaccine   Chronic diarrhea    For one year-needed prolonged course of antibiotics   Diabetes mellitus without complication (HCC)    Diverticulosis 02/24/2014   Emphysema of lung (HCC)    mild per pt. per x ray    History of UTI 2014   per pt noted after taking Bactrim due to tooth infection   Hyperlipidemia    Neuromuscular disorder (HCC)    neuropathy both feet    Osteopenia    Right adrenal mass (HCC)    Biopsied in 2008 and benign   Vaginal prolapse    Status post surgery. Improved urinary incontinence   Past Surgical History:  Procedure Laterality Date   ABDOMINAL HYSTERECTOMY  09/07/2013   ABDOMINAL SACROCOLPOPEXY  09/07/2013   BLADDER SURGERY  09/07/2013   vaginal prolapse   BREAST BIOPSY Right    2014? near nipple. benign.   COLONOSCOPY  11/06/2005   sig tics, no polyps-[facility]    CYSTOSCOPY  09/07/2013   ENTEROCELE REPAIR  09/07/2013   INDUCED ABORTION  1985   Therapeutic   LAPAROSCOPIC BILATERAL SALPINGO OOPHERECTOMY  09/07/2013   POLYPECTOMY     PUBOVAGINAL SLING  09/07/2013   TONSILLECTOMY Bilateral 1955   TUBAL LIGATION     Family History  Problem Relation Age of Onset   Cancer Father        Lung but was a smoker   Stroke Father        51 massive lead to death   Diabetes Father    Bladder Cancer Father    Lung cancer Father    Osteoporosis Mother    Anuerysm Mother     Diabetes Maternal Grandmother    Colon cancer Neg Hx    Colon polyps Neg Hx    Esophageal cancer Neg Hx    Rectal cancer Neg Hx    Stomach cancer Neg Hx    Breast cancer Neg Hx    Social History   Socioeconomic History   Marital status: Married    Spouse name: Not on file   Number of children: Not on file   Years of education: Not on file   Highest education level: Not on file  Occupational History   Occupation: retired  Tobacco Use  Smoking status: Every Day    Current packs/day: 1.00    Average packs/day: 1 pack/day for 53.0 years (53.0 ttl pk-yrs)    Types: Cigarettes   Smokeless tobacco: Never   Tobacco comments:    Counseled that the single most powerful action she could take to decrease her risk of lung cancer is to quit smoking  Substance and Sexual Activity   Alcohol use: No    Alcohol/week: 0.0 standard drinks of alcohol   Drug use: No   Sexual activity: Not on file  Other Topics Concern   Not on file  Social History Narrative   Married 45 years in 2015.  Moved to [place] to be near daughter who lives here. Has a son as well and 102 year old grandson. 3 granddogs. One dog at home       Retired former  Armed forces training and education officer. Has a BS in biology from [employer]. Used to be an algologist-studied algae under microscope      Hobbies-exercising with her husband      Social Determinants of Health   Financial Resource Strain: Low Risk  (04/10/2023)   Overall Financial Resource Strain (CARDIA)    Difficulty of Paying Living Expenses: Not hard at all  Food Insecurity: No Food Insecurity (04/10/2023)   Hunger Vital Sign    Worried About Running Out of Food in the Last Year: Never true    Ran Out of Food in the Last Year: Never true  Transportation Needs: No Transportation Needs (04/10/2023)   PRAPARE - Administrator, Civil Service (Medical): No    Lack of Transportation (Non-Medical): No  Physical Activity: Sufficiently Active (04/10/2023)   Exercise  Vital Sign    Days of Exercise per Week: 7 days    Minutes of Exercise per Session: 60 min  Stress: No Stress Concern Present (04/10/2023)   [employer] of Occupational Health - Occupational Stress Questionnaire    Feeling of Stress : Not at all  Social Connections: Moderately Isolated (04/10/2023)   Social Connection and Isolation Panel [NHANES]    Frequency of Communication with Friends and Family: More than three times a week    Frequency of Social Gatherings with Friends and Family: Once a week    Attends Religious Services: Never    Database administrator or Organizations: No    Attends Engineer, structural: Never    Marital Status: Married    Tobacco Counseling Ready to quit: Not Answered Counseling given: Not Answered Tobacco comments: Counseled that the single most powerful action she could take to decrease her risk of lung cancer is to quit smoking   Clinical Intake:  Pre-visit preparation completed: Yes  Pain : No/denies pain     BMI - recorded: 27.98 Nutritional Status: BMI 25 -29 Overweight Nutritional Risks: None Diabetes: Yes CBG done?: Yes (112 per pt) CBG resulted in Enter/ Edit results?: No Did pt. bring in CBG monitor from home?: No  How often do you need to have someone help you when you read instructions, pamphlets, or other written materials from your doctor or pharmacy?: 1 - Never  Interpreter Needed?: No  Information entered by :: Lanier Ensign, LPN   Activities of Daily Living    04/10/2023    2:25 PM  In your present state of health, do you have any difficulty performing the following activities:  Hearing? 0  Vision? 0  Difficulty concentrating or making decisions? 0  Walking or climbing stairs? 1  Comment neuropathy  Dressing or bathing? 0  Doing errands, shopping? 0  Preparing Food and eating ? N  Using the Toilet? N  In the past six months, have you accidently leaked urine? N  Do you have problems with loss of  bowel control? N  Managing your Medications? N  Managing your Finances? N  Housekeeping or managing your Housekeeping? N    Patient Care Team: Shelva Majestic, MD as PCP - General (Family Medicine) Delora Fuel, OD as Consulting Physician (Optometry) Andrena Mews, DO as Consulting Physician (Sports Medicine)  Indicate any recent Medical Services you may have received from other than [facility] providers in the past year (date may be approximate).     Assessment:   This is a routine wellness examination for Samantha Snyder.  Hearing/Vision screen Hearing Screening - Comments:: Pt denies any hearing issues  Vision Screening - Comments:: Pt [facility]    Goals Addressed   None    Depression Screen    04/14/2023    1:39 PM 02/07/2023   10:20 AM 04/08/2022    1:35 PM 03/30/2021    8:16 AM 03/26/2021    1:06 PM 09/19/2020    7:59 AM 03/16/2020    9:51 AM  PHQ 2/9 Scores  PHQ - 2 Score 0 0 0 0 0 0 0  PHQ- 9 Score 0 0         Fall Risk    04/10/2023    2:25 PM 04/08/2022    1:30 PM 03/30/2021    8:16 AM 03/26/2021    1:08 PM 09/19/2020    7:58 AM  Fall Risk   Falls in the past year? 1 1 1 1 1   Number falls in past yr: 1 1 0 1 0  Injury with Fall? 0 1 1 1 1   Comment  hematoma from fall was seen in ER  right ring finger sprain   Risk for fall due to : Impaired vision;Impaired mobility Impaired vision History of fall(s) Impaired vision;Impaired balance/gait   Follow up Falls prevention discussed Falls prevention discussed  Falls prevention discussed     MEDICARE RISK AT HOME: Medicare Risk at Home Any stairs in or around the home?: No If so, are there any without handrails?: No Home free of loose throw rugs in walkways, pet beds, electrical cords, etc?: Yes Adequate lighting in your home to reduce risk of falls?: Yes Life alert?: No Use of a cane, walker or w/c?: Yes Grab bars in the bathroom?: Yes Shower chair or bench in shower?: No Elevated toilet seat or a  handicapped toilet?: No  TIMED UP AND GO:  Was the test performed?  No    Cognitive Function:    03/12/2018   10:16 AM 03/11/2017   10:48 AM  MMSE - Mini Mental State Exam  Not completed: -- --        04/14/2023    1:42 PM 04/08/2022    1:37 PM 03/26/2021    1:12 PM 03/16/2020    9:58 AM 03/15/2019   10:15 AM  6CIT Screen  What Year? 0 points 0 points 0 points 0 points 0 points  What month? 0 points 0 points 0 points 0 points 0 points  What time? 0 points 0 points 0 points  0 points  Count back from 20 0 points 0 points 0 points 0 points 0 points  Months in reverse 0 points 0 points 0 points 0 points 0 points  Repeat phrase 0 points 0  points 0 points 0 points 0 points  Total Score 0 points 0 points 0 points  0 points    Immunizations Immunization History  Administered Date(s) Administered   Fluad Quad(high Dose 65+) 03/15/2019, 03/20/2020, 03/30/2021, 04/17/2022   Hepatitis A 02/28/1948   Hepatitis B 03[birth year]28/1947   Influenza, High Dose Seasonal PF 03/17/2014, 03/07/2016, 03/11/2017, 03/12/2018, 02/25/2023   Influenza,inj,Quad PF,6+ Mos 03/02/2015   Influenza-Unspecified 03/10/2010, 02/25/2012, 03/17/2013   MMR 05/14/1990, 05/14/1992   Moderna Covid-19 Fall Seasonal Vaccine 46yrs & older 04/05/2022   PFIZER Comirnaty(Gray Top)Covid-19 Tri-Sucrose Vaccine 02/25/2023   PFIZER(Purple Top)SARS-COV-2 Vaccination 08/15/2019, 09/08/2019, 04/23/2020, 10/25/2020, 11/02/2021   Pfizer Covid-19 Vaccine Bivalent Booster 63yrs & up 03/06/2021   Pfizer(Comirnaty)Fall Seasonal Vaccine 12 years and older 02/25/2023   Pneumococcal Conjugate-13 03/17/2013, 06/07/2013   Pneumococcal Polysaccharide-23 09/19/2005, 02/24/2014   Pneumococcal-Unspecified 09/19/2005   Respiratory Syncytial Virus Vaccine,Recomb Aduvanted(Arexvy) 05/08/2022   Td 02/28/1950, 02/29/1956, 09/19/2005, 03/07/2016   Tdap 02/29/1960   Varicella 10/19/1991   Zoster Recombinant(Shingrix) 10/28/2016, 01/30/2017   Zoster,  Live 02/28/2009   l TDAP status: Up to date  Flu Vaccine status: Up to date  Pneumococcal vaccine status: Up to date  Covid-19 vaccine status: Completed vaccines  Qualifies for Shingles Vaccine? Yes   Zostavax completed Yes   Shingrix Completed?: Yes  Screening Tests Health Maintenance  Topic Date Due   COVID-19 Vaccine (9 - 2023-24 season) 04/22/2023   OPHTHALMOLOGY EXAM  06/21/2023   HEMOGLOBIN A1C  08/10/2023   FOOT EXAM  08/15/2023   Lung Cancer Screening  12/12/2023   Colonoscopy  01/21/2024   Diabetic kidney evaluation - eGFR measurement  02/07/2024   Diabetic kidney evaluation - Urine ACR  02/07/2024   Medicare Annual Wellness (AWV)  04/13/2024   DTaP/Tdap/Td (5 - Td or Tdap) 03/07/2026   Pneumonia Vaccine 40+ Years old  Completed   INFLUENZA VACCINE  Completed   DEXA SCAN  Completed   Hepatitis C Screening  Completed   Zoster Vaccines- Shingrix  Completed   HPV VACCINES  Aged Out    Health Maintenance  There are no preventive care reminders to display for this patient.   Colorectal cancer screening: Type of screening: Colonoscopy. Completed 01/21/19. Repeat every 5 years  Mammogram status: Ordered scheduled for 05/26/23. Pt provided with contact info and advised to call to schedule appt.   Bone Density status: Completed 10/29/22. Results reflect: Bone density results: OSTEOPOROSIS. Repeat every 2 years.  Lung Cancer Screening: (Low Dose CT Chest recommended if Age 45-80 years, 20 pack-year currently smoking OR have quit w/in 15years.) does qualify.   Lung Cancer Screening Referral: completed 12/12/22  Additional Screening:  Hepatitis C Screening: Completed 03/02/15  Vision Screening: Recommended annual ophthalmology exams for early detection of glaucoma and other disorders of the eye. Is the patient up to date with their annual eye exam?  Yes  Who is the provider or what is the name of the office in which the patient attends annual eye exams? [facility]   If pt is not established with a provider, would they like to be referred to a provider to establish care? No .   Dental Screening: Recommended annual dental exams for proper oral hygiene  Diabetic Foot Exam: Diabetic Foot Exam: Completed 08/14/22  Community Resource Referral / Chronic Care Management: CRR required this visit?  No   CCM required this visit?  No     Plan:     I have personally reviewed and noted the following in the patient's  chart:   Medical and social history Use of alcohol, tobacco or illicit drugs  Current medications and supplements including opioid prescriptions. Patient is not currently taking opioid prescriptions. Functional ability and status Nutritional status Physical activity Advanced directives List of other physicians Hospitalizations, surgeries, and ER visits in previous 12 months Vitals Screenings to include cognitive, depression, and falls Referrals and appointments  In addition, I have reviewed and discussed with patient certain preventive protocols, quality metrics, and best practice recommendations. A written personalized care plan for preventive services as well as general preventive health recommendations were provided to patient.     Marzella Schlein, LPN   [id number]   After Visit Summary: (MyChart) Due to this being a telephonic visit, the after visit summary with patients personalized plan was offered to patient via MyChart   Nurse Notes: none

## 2023-05-01 ENCOUNTER — Other Ambulatory Visit: Payer: Self-pay | Admitting: Family Medicine

## 2023-05-21 ENCOUNTER — Encounter: Payer: Self-pay | Admitting: Family Medicine

## 2023-05-26 ENCOUNTER — Ambulatory Visit: Payer: Medicare PPO

## 2023-06-03 ENCOUNTER — Ambulatory Visit
Admission: RE | Admit: 2023-06-03 | Discharge: 2023-06-03 | Disposition: A | Discharge status: Home / Self Care | Payer: Medicare PPO | Source: Ambulatory Visit | Attending: Family Medicine | Admitting: Family Medicine

## 2023-06-03 DIAGNOSIS — Z1231 Encounter for screening mammogram for malignant neoplasm of breast: Secondary | ICD-10-CM

## 2023-06-23 ENCOUNTER — Other Ambulatory Visit: Payer: Self-pay | Admitting: Family Medicine

## 2023-07-10 LAB — HM DIABETES EYE EXAM

## 2023-07-14 ENCOUNTER — Ambulatory Visit: Payer: Self-pay | Admitting: Family Medicine

## 2023-07-14 NOTE — Telephone Encounter (Signed)
You may refill the diclofenac gel requested

## 2023-07-14 NOTE — Telephone Encounter (Signed)
This RN returned patient's call. Patient wanted to know if she could take AREDS 2 with the rest of her medications. This RN advised she could do so. Patient also requested a refill for diclofenac sodium 1% gel 400g. Patient requested 4x100g tubes of this medication, if possible. Advised patient to call back if she needed anything else. Patient complied.   Copied from CRM [phone]. Topic: Clinical - Medication Question >> Adhya 20, 2025  3:47 PM Adaysia C wrote: Reason for CRM: Patient was prescribed AREDS 2 by her eye doctor and she wanted to ask the provider if that medication is safe to use with her current prescribed medications. Please follow up with patient [phone] Reason for Disposition  Health Information question, no triage required and triager able to answer question  Protocols used: Information Only Call - No Triage-A-AH

## 2023-07-15 MED ORDER — DICLOFENAC SODIUM 1 % EX GEL: 2.0000 g | Freq: Four times a day (QID) | CUTANEOUS | 5 refills | Status: DC | PRN

## 2023-07-15 NOTE — Telephone Encounter (Signed)
Rx sent to pharmacy   

## 2023-07-15 NOTE — Addendum Note (Signed)
Addended by: Jimmye Norman on: 07/15/2023 07:27 AM   Modules accepted: Orders

## 2023-07-17 ENCOUNTER — Other Ambulatory Visit: Payer: Self-pay | Admitting: Family Medicine

## 2023-07-21 ENCOUNTER — Other Ambulatory Visit: Payer: Self-pay | Admitting: Family Medicine

## 2023-07-21 NOTE — Telephone Encounter (Signed)
harmacy comment: Alternative Requested:NOT COVERED.

## 2023-08-09 ENCOUNTER — Other Ambulatory Visit: Payer: Self-pay | Admitting: Family Medicine

## 2023-08-29 ENCOUNTER — Encounter: Payer: Self-pay | Admitting: Family Medicine

## 2023-08-29 ENCOUNTER — Ambulatory Visit: Payer: Medicare PPO | Admitting: Family Medicine

## 2023-08-29 VITALS — BP 122/70 | HR 90 | Temp 97.0°F | Ht 68.0 in | Wt 191.4 lb

## 2023-08-29 DIAGNOSIS — Z0000 Encounter for general adult medical examination without abnormal findings: Secondary | ICD-10-CM

## 2023-08-29 DIAGNOSIS — J439 Emphysema, unspecified: Secondary | ICD-10-CM

## 2023-08-29 DIAGNOSIS — Z23 Encounter for immunization: Secondary | ICD-10-CM

## 2023-08-29 DIAGNOSIS — I10 Essential (primary) hypertension: Secondary | ICD-10-CM | POA: Diagnosis not present

## 2023-08-29 DIAGNOSIS — Z7985 Long-term (current) use of injectable non-insulin antidiabetic drugs: Secondary | ICD-10-CM

## 2023-08-29 DIAGNOSIS — Z720 Tobacco use: Secondary | ICD-10-CM

## 2023-08-29 DIAGNOSIS — E785 Hyperlipidemia, unspecified: Secondary | ICD-10-CM | POA: Diagnosis not present

## 2023-08-29 DIAGNOSIS — E1169 Type 2 diabetes mellitus with other specified complication: Secondary | ICD-10-CM | POA: Diagnosis not present

## 2023-08-29 DIAGNOSIS — M810 Age-related osteoporosis without current pathological fracture: Secondary | ICD-10-CM

## 2023-08-29 DIAGNOSIS — I700 Atherosclerosis of aorta: Secondary | ICD-10-CM | POA: Diagnosis not present

## 2023-08-29 DIAGNOSIS — E1142 Type 2 diabetes mellitus with diabetic polyneuropathy: Secondary | ICD-10-CM | POA: Diagnosis not present

## 2023-08-29 DIAGNOSIS — Z7984 Long term (current) use of oral hypoglycemic drugs: Secondary | ICD-10-CM

## 2023-08-29 LAB — LIPID PANEL
Cholesterol: 129 mg/dL (ref 0–200)
HDL: 44.1 mg/dL (ref >39.00)
LDL Cholesterol: 60 mg/dL (ref 0–99)
NonHDL: 84.92
Total CHOL/HDL Ratio: 3
Triglycerides: 124 mg/dL (ref 0.0–149.0)
VLDL: 24.8 mg/dL (ref 0.0–40.0)

## 2023-08-29 LAB — CBC WITH DIFFERENTIAL/PLATELET
Basophils Absolute: 0.1 10*3/uL (ref 0.0–0.1)
Basophils Relative: 0.7 % (ref 0.0–3.0)
Eosinophils Absolute: 0.1 10*3/uL (ref 0.0–0.7)
Eosinophils Relative: 1.3 % (ref 0.0–5.0)
HCT: 46.1 % — ABNORMAL HIGH (ref 36.0–46.0)
Hemoglobin: 15.1 g/dL — ABNORMAL HIGH (ref 12.0–15.0)
Lymphocytes Relative: 13.3 % (ref 12.0–46.0)
Lymphs Abs: 1.2 10*3/uL (ref 0.7–4.0)
MCHC: 32.7 g/dL (ref 30.0–36.0)
MCV: 93.2 fl (ref 78.0–100.0)
Monocytes Absolute: 0.7 10*3/uL (ref 0.1–1.0)
Monocytes Relative: 7.4 % (ref 3.0–12.0)
Neutro Abs: 7.2 10*3/uL (ref 1.4–7.7)
Neutrophils Relative %: 77.3 % — ABNORMAL HIGH (ref 43.0–77.0)
Platelets: 362 10*3/uL (ref 150.0–400.0)
RBC: 4.95 Mil/uL (ref 3.87–5.11)
RDW: 15 % (ref 11.5–15.5)
WBC: 9.3 10*3/uL (ref 4.0–10.5)

## 2023-08-29 LAB — COMPREHENSIVE METABOLIC PANEL
ALT: 13 U/L (ref 0–35)
AST: 12 U/L (ref 0–37)
Albumin: 4.1 g/dL (ref 3.5–5.2)
Alkaline Phosphatase: 76 U/L (ref 39–117)
BUN: 19 mg/dL (ref 6–23)
CO2: 26 meq/L (ref 19–32)
Calcium: 9.5 mg/dL (ref 8.4–10.5)
Chloride: 108 meq/L (ref 96–112)
Creatinine, Ser: 0.57 mg/dL (ref 0.40–1.20)
GFR: 88.5 mL/min (ref >60.00)
Glucose, Bld: 113 mg/dL — ABNORMAL HIGH (ref 70–99)
Potassium: 4.4 meq/L (ref 3.5–5.1)
Sodium: 144 meq/L (ref 135–145)
Total Bilirubin: 0.4 mg/dL (ref 0.2–1.2)
Total Protein: 6.2 g/dL (ref 6.0–8.3)

## 2023-08-29 LAB — URINALYSIS, ROUTINE W REFLEX MICROSCOPIC
Bilirubin Urine: NEGATIVE
Hgb urine dipstick: NEGATIVE
Ketones, ur: NEGATIVE
Nitrite: POSITIVE — AB
Specific Gravity, Urine: 1.025 (ref 1.000–1.030)
Total Protein, Urine: NEGATIVE
Urine Glucose: NEGATIVE
Urobilinogen, UA: 0.2 (ref 0.0–1.0)
pH: 6 (ref 5.0–8.0)

## 2023-08-29 LAB — HEMOGLOBIN A1C: Hgb A1c MFr Bld: 6.6 % — ABNORMAL HIGH (ref 4.6–6.5)

## 2023-08-29 LAB — MICROALBUMIN / CREATININE URINE RATIO
Creatinine,U: 72.1 mg/dL
Microalb Creat Ratio: 9.7 mg/g (ref 0.0–30.0)
Microalb, Ur: <0.7 mg/dL (ref 0.0–1.9)

## 2023-08-29 LAB — VITAMIN D 25 HYDROXY (VIT D DEFICIENCY, FRACTURES): VITD: 56.54 ng/mL (ref 30.00–100.00)

## 2023-08-29 NOTE — Progress Notes (Signed)
 Phone [phone]   Subjective:  Patient presents today for their annual physical. Chief complaint-noted.   See problem oriented charting- ROS- full  review of systems was completed and negative Per full ROS sheet completed by patient other than stable chronic issues such as neuropathy  The following were reviewed and entered/updated in epic: Past Medical History:  Diagnosis Date   Allergy Teenager   minor hay fever   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 per x ray    History of UTI 2014   per pt noted after taking Bactrim due to tooth infection   Hyperlipidemia    Neuromuscular disorder (HCC) 2010   neuropathy both feet    Osteopenia    Osteoporosis 2022   Osteoporosis   Right adrenal mass (HCC)    Biopsied in 2008 and benign   Vaginal prolapse    Status post surgery. Improved urinary incontinence   Patient Active Problem List   Diagnosis Date Noted   History of subdural hematoma 10/05/2021    Priority: High   DM (diabetes mellitus) type II controlled, neurological manifestation (HCC) 02/24/2014    Priority: High   Tobacco abuse 02/24/2014    Priority: High   Aortic atherosclerosis (HCC) 07/08/2017    Priority: Medium    Osteoporosis 03/30/2014    Priority: Medium    Essential hypertension 03/08/2014    Priority: Medium    Hyperlipidemia associated with type 2 diabetes mellitus (HCC) 02/24/2014    Priority: Medium    Emphysema lung (HCC) 12/19/2020    Priority: Low   Diabetic peripheral neuropathy (HCC) 10/27/2014    Priority: Low   Seasonal allergies 02/24/2014    Priority: Low   Past Surgical History:  Procedure Laterality Date   ABDOMINAL HYSTERECTOMY  09-07-2013    ABDOMINAL SACROCOLPOPEXY  09/07/2013   BLADDER SURGERY  09/07/2013   vaginal prolapse   BREAST BIOPSY Right    2014? near nipple. benign.   COLONOSCOPY  11/06/2005   sig tics, no polyps-[facility]    CYSTOSCOPY  09/07/2013   ENTEROCELE REPAIR  09/07/2013   INDUCED ABORTION  1985   Therapeutic   LAPAROSCOPIC BILATERAL SALPINGO OOPHERECTOMY  09/07/2013   POLYPECTOMY     PUBOVAGINAL SLING  09/07/2013   TONSILLECTOMY Bilateral 1955   TUBAL LIGATION  07/31/1984    Family History  Problem Relation Age of Onset   Cancer Father        Lung but was a smoker   Stroke Father        10 massive lead to death   Diabetes Father    Bladder Cancer Father    Lung cancer Father    Vision loss Father    Osteoporosis Mother    Anuerysm Mother    Hearing loss Mother    Diabetes Maternal Grandmother    Colon cancer Neg Hx    Colon polyps Neg Hx    Esophageal cancer Neg Hx    Rectal cancer Neg Hx    Stomach cancer Neg Hx    Breast cancer Neg Hx     Medications- reviewed and updated Current Outpatient Medications  Medication  Sig Dispense Refill   acetaminophen (TYLENOL) 500 MG tablet Take 500 mg by mouth in the morning and at bedtime.     Alcohol Swabs (DROPSAFE ALCOHOL PREP) 70 % PADS USE TO CLEANSE ARE BEFORE CHECKING BLOOD SUGAR. 400 each 3   alendronate (FOSAMAX) 70 MG tablet TAKE 1 TABLET BY MOUTH EVERY 7 DAYS. TAKE WITH A FULL GLASS OF WATER ON AN EMPTY STOMACH. 12 tablet 3   Alpha-Lipoic Acid 600 MG CAPS TAKE 1 CAPSULE (600 MG TOTAL) BY MOUTH DAILY 60 capsule 5   atorvastatin (LIPITOR) 20 MG tablet TAKE 1 TABLET EVERY DAY 90 tablet 3   Blood Glucose Monitoring Suppl (TRUE METRIX AIR GLUCOSE METER) w/Device KIT USE TO TEST BLOOD SUGARS DAILY 1 kit 3   Calcium Carbonate (CALCIUM 600 PO) Take 1,200 mg by mouth at bedtime.     Cholecalciferol (VITAMIN D3) 2000 UNITS TABS Take 2,000 Units by mouth at bedtime.     diclofenac Sodium (VOLTAREN) 1 % GEL APPLY 2 GRAMS TOPICALLY TO  PAINFUL SITES OF FEET AND HANDS 4 TIMES A DAY AS NEEDED 400 g 5   fexofenadine (ALLEGRA) 180 MG tablet Take 180 mg by mouth daily.     lisinopril (ZESTRIL) 5 MG tablet TAKE 1 TABLET EVERY DAY 90 tablet 3   Magnesium 500 MG CAPS Take 500 mg by mouth at bedtime.     metFORMIN (GLUCOPHAGE) 1000 MG tablet TAKE 1 TABLET TWICE DAILY WITH MEALS 180 tablet 3   Multiple Vitamins-Minerals (HAIR SKIN AND NAILS FORMULA PO) Take 2 tablets by mouth daily.     Multiple Vitamins-Minerals (MULTIVITAMIN PO) Take 1 tablet by mouth at bedtime.     Multiple Vitamins-Minerals (PRESERVISION AREDS 2) CAPS Take by mouth. For macular degeneration twice daily     Omega-3 Fatty Acids (FISH OIL PO) Take 2,000 mg by mouth in the morning and at bedtime.     OVER THE COUNTER MEDICATION Take 240 mLs by mouth See admin instructions. Herbal tea- Drink 8 ounces by mouth daily     Probiotic Product (PROBIOTIC DAILY PO) Take 1 capsule by mouth every evening.     Semaglutide,0.25 or 0.5MG /DOS, (OZEMPIC, 0.25 OR 0.5 MG/DOSE,) 2 MG/3ML SOPN INJECT 0.5MG  AS DIRECTED ONCE WEEKLY 9 mL 3   TRUE METRIX BLOOD GLUCOSE TEST test strip TEST BLOOD SUGAR UP TO FOUR TIMES DAILY 350 strip 3   TRUEplus Lancets 33G MISC TEST BLOOD SUGAR UP TO FOUR TIMES DAILY 400 each 3   No current facility-administered medications for this visit.    Allergies-reviewed and updated Allergies  Allergen Reactions   Fluoride Preparations Nausea Only    Patient states it burned her mouth and she had abdominal pain for 24-36 hours (dental office)    Social History   Social History Narrative   Married 45 years in 2015.  Moved to [place] to be near daughter who lives here. Has a son as well and 45 year old grandson. 3 granddogs. One dog at home       Retired former  Armed forces training and education officer. Has a BS in biology from [employer]. Used to be an algologist-studied algae under microscope      Hobbies-exercising with her husband      Objective  Objective:  BP 122/70    Pulse 90   Temp (!) 97 F (36.1 C)   Ht 5\' 8"  (1.727 m)   Wt 191 lb 6.4 oz (86.8 kg)   LMP  (LMP Unknown)   SpO2 95%   BMI  29.10 kg/m  Gen: NAD, resting comfortably HEENT: Mucous membranes are moist. Oropharynx normal Neck: no thyromegaly CV: RRR no murmurs rubs or gallops Lungs: CTAB no crackles, wheeze, rhonchi Abdomen: soft/nontender/nondistended/normal bowel sounds. No rebound or guarding.  Ext: no edema Skin: warm, dry Neuro: grossly normal, moves all extremities, PERRLA  Diabetic Foot Exam - Simple   Simple Foot Form Diabetic Foot exam was performed with the following findings: Yes 08/29/2023  8:30 AM  Visual Inspection See comments: Yes Sensation Testing See comments: Yes Pulse Check Posterior Tibialis and Dorsalis pulse intact bilaterally: Yes Comments Does not feel monofilament until midfoot on either foot. 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 Bunion bilaterally. Callous at IP Joint       Assessment and Plan   78 y.o. female presenting for annual physical.  Health Maintenance counseling: 1. Anticipatory guidance: Patient counseled regarding regular dental exams -q4 months, eye exams - yearly or more ,  avoiding smoking and second hand smoke- see below , limiting alcohol to 1 beverage per day- none , no illicit drugs .   2. Risk factor reduction:  Advised patient of need for regular exercise and diet rich and fruits and vegetables to reduce risk of heart attack and stroke.  Exercise- neuropathy makes this more challenging from balance perspective and pain- using an elliptical for her legs from seated position and finds helpful- twice a day and staying active around the home.  Diet/weight management-weight up 7 lbs- feels could cut back on portions and encouraged- already on ozempic. Was doing more candy than she should- should cut back  Wt Readings from Last 3 Encounters:  08/29/23 191 lb 6.4 oz (86.8  kg)  04/14/23 184 lb (83.5 kg)  02/07/23 184 lb (83.5 kg)  3. Immunizations/screenings/ancillary studies- up to date  other than opts in for Prevnar 20 Immunization History  Administered Date(s) Administered   Fluad Quad(high Dose 65+) 03/15/2019, 03/20/2020, 03/30/2021, 04/17/2022   Hepatitis A 02/28/1948   Hepatitis B 03[birth year]28/1947   Influenza, High Dose Seasonal PF 03/17/2014, 03/07/2016, 03/11/2017, 03/12/2018, 02/25/2023   Influenza,inj,Quad PF,6+ Mos 03/02/2015   Influenza-Unspecified 03/10/2010, 02/25/2012, 03/17/2013   MMR 05/14/1990, 05/14/1992   Moderna Covid-19 Fall Seasonal Vaccine 33yrs & older 04/05/2022   PFIZER Comirnaty(Gray Top)Covid-19 Tri-Sucrose Vaccine 02/25/2023   PFIZER(Purple Top)SARS-COV-2 Vaccination 08/15/2019, 09/08/2019, 04/23/2020, 10/25/2020, 11/02/2021   Pfizer Covid-19 Vaccine Bivalent Booster 34yrs & up 03/06/2021   Pfizer(Comirnaty)Fall Seasonal Vaccine 12 years and older 02/25/2023   Pneumococcal Conjugate-13 03/17/2013, 06/07/2013   Pneumococcal Polysaccharide-23 09/19/2005, 02/24/2014   Pneumococcal-Unspecified 09/19/2005   Respiratory Syncytial Virus Vaccine,Recomb Aduvanted(Arexvy) 05/08/2022   Td 02/28/1950, 02/29/1956, 09/19/2005, 03/07/2016   Tdap 02/29/1960   Varicella 10/19/1991   Zoster Recombinant(Shingrix) 10/28/2016, 01/30/2017   Zoster, Live 02/28/2009   4. Cervical cancer screening- past age based screening recommendations  5. Breast cancer screening-  breast exam -declines- and mammogram 06/03/23 6. Colon cancer screening - due July of this year for 5 year repeat due to adenoma 7. Skin cancer screening- dermatology specialists within last year.- plans for yearly advised regular sunscreen use. Denies worrisome, changing, or new skin lesions.  8. Birth control/STD check-postmenopausal monogamous 9. Osteoporosis screening at 65-see below 10. Smoking associated screening -current smoker-1 pack/day.  In lung cancer screening program.  Get  urinalysis.  Incidentally noted emphysema but remains asymptomatic thankfully. Encouraged full cessation.    Status of chronic or acute concerns   #hypertension  S: medication: Lisinopril 5Mg  BP Readings from Last 3 Encounters:  08/29/23 122/70  02/07/23 120/82  12/03/22 130/78  A/P: well controlled continue current medications   #hyperlipidemia #Aortic atherosclerosis-LDL goal under 70.  Incidental finding on imaging S: Medication: Atorvastatin 20Mg  -Prior on aspirin 81 mg for primary prevention-stopped after subdural hematoma April 2023 Lab Results  Component Value Date   CHOL 132 08/14/2022   HDL 47.10 08/14/2022   LDLCALC 58 08/14/2022   LDLDIRECT 73.0 03/11/2017   TRIG 133.0 08/14/2022   CHOLHDL 3 08/14/2022   A/P: hopefully stable- update lipid panel today. Continue current meds for now   # Diabetes-A1c typically under 7 #Diabetic neuropathy-alpha lipoic acid generally helpful, also on voltaren gel .  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 Continue current meds for now  Neuropathy ongoing issue but stbale- continue current medications  - will update prescription for diabetic shoes and inserts- working with [employer]  #Osteoporosis S: Medication: Fosamax 70 mg started May 2022 - some loose stool with this day after Last DEXA: -unfortunately DEXA may 2024 was on different machine- we will stick with elam now- worst to score -1.4  Calcium: 1200mg  (through diet ok) recommended  Vitamin D: 1000 units a day recommended Last vitamin D Lab Results  Component Value Date   VD25OH 45.17 09/19/2020  A/P: appears stable- continue current medications and recheck DEXA may  2026 or later   # History of subdural hematoma after accident in early 2023-follow-up with neurosurgery and reported clearance August 2023 - scan in June 2023 - no recent headaches  #Macular degeneration- on areds 2  Recommended follow up: Return in about 6 months (around 02/29/2024) for followup or sooner if needed.Schedule b4 you leave. Future Appointments  Date Time Provider Department Center  04/19/2024  1:40 PM [facility] ANNUAL WELLNESS VISIT 1 [facility]   Lab/Order associations: fasting   ICD-10-CM   1. Preventative health care  Z00.00     2. Controlled type 2 diabetes mellitus with diabetic polyneuropathy, without long-term current use of insulin (HCC)  E11.42 Hemoglobin A1c    Comprehensive metabolic panel    CBC with Differential/Platelet    Lipid panel    Microalbumin / creatinine urine ratio    3. Tobacco abuse  Z72.0 Urinalysis, Routine w reflex microscopic    4. Age-related osteoporosis without current pathological fracture  M81.0 VITAMIN D 25 Hydroxy (Vit-D Deficiency, Fractures)    5. Essential hypertension  I10 Comprehensive metabolic panel    CBC with Differential/Platelet    Lipid panel    6. Hyperlipidemia associated with type 2 diabetes mellitus (HCC)  E11.69 Comprehensive metabolic panel   Z61.0 CBC with Differential/Platelet    Lipid panel    7. Aortic atherosclerosis (HCC)  I70.0     8. Pulmonary emphysema, unspecified emphysema type (HCC)  J43.9       No orders of the defined types were placed in this encounter.   Return precautions advised.  Tana Conch, MD

## 2023-08-29 NOTE — Patient Instructions (Addendum)
 Please stop by lab before you go If you have mychart- we will send your results within 3 business days of [place] receiving them.  If you do not have mychart- we will call you about results within 5 business days of [place] receiving them.  *please also note that you will see labs on mychart as soon as they post. I will later go in and write notes on them- will say "notes from Dr. Durene Cal"   Prevnar 20 today  Team can print copy of notes and send/submit requested information for diabetes related shoes and insoles  Recommended follow up: Return in about 6 months (around 02/29/2024) for followup or sooner if needed.Schedule b4 you leave.

## 2023-08-29 NOTE — Addendum Note (Signed)
 Addended by: Gwenette Greet on: 08/29/2023 08:52 AM   Modules accepted: Orders

## 2023-09-01 ENCOUNTER — Encounter: Payer: Self-pay | Admitting: Family Medicine

## 2023-09-02 DIAGNOSIS — E1142 Type 2 diabetes mellitus with diabetic polyneuropathy: Secondary | ICD-10-CM | POA: Diagnosis not present

## 2023-09-23 DIAGNOSIS — S060XAA Concussion with loss of consciousness status unknown, initial encounter: Secondary | ICD-10-CM

## 2023-09-23 HISTORY — DX: Concussion with loss of consciousness status unknown, initial encounter: S06.0XAA

## 2023-10-11 ENCOUNTER — Emergency Department (HOSPITAL_COMMUNITY)
Admission: EM | Admit: 2023-10-11 | Discharge: 2023-10-11 | Disposition: A | Discharge status: Home / Self Care | Attending: Emergency Medicine | Admitting: Emergency Medicine

## 2023-10-11 ENCOUNTER — Emergency Department (HOSPITAL_COMMUNITY)

## 2023-10-11 ENCOUNTER — Other Ambulatory Visit: Payer: Self-pay

## 2023-10-11 DIAGNOSIS — M47812 Spondylosis without myelopathy or radiculopathy, cervical region: Secondary | ICD-10-CM | POA: Diagnosis not present

## 2023-10-11 DIAGNOSIS — I10 Essential (primary) hypertension: Secondary | ICD-10-CM | POA: Diagnosis not present

## 2023-10-11 DIAGNOSIS — W01198A Fall on same level from slipping, tripping and stumbling with subsequent striking against other object, initial encounter: Secondary | ICD-10-CM | POA: Insufficient documentation

## 2023-10-11 DIAGNOSIS — S0990XA Unspecified injury of head, initial encounter: Secondary | ICD-10-CM | POA: Diagnosis not present

## 2023-10-11 DIAGNOSIS — Z794 Long term (current) use of insulin: Secondary | ICD-10-CM | POA: Insufficient documentation

## 2023-10-11 DIAGNOSIS — G4489 Other headache syndrome: Secondary | ICD-10-CM | POA: Diagnosis not present

## 2023-10-11 DIAGNOSIS — W19XXXA Unspecified fall, initial encounter: Secondary | ICD-10-CM

## 2023-10-11 DIAGNOSIS — S199XXA Unspecified injury of neck, initial encounter: Secondary | ICD-10-CM | POA: Diagnosis not present

## 2023-10-11 DIAGNOSIS — M4802 Spinal stenosis, cervical region: Secondary | ICD-10-CM | POA: Diagnosis not present

## 2023-10-11 DIAGNOSIS — M4310 Spondylolisthesis, site unspecified: Secondary | ICD-10-CM | POA: Diagnosis not present

## 2023-10-11 MED ORDER — ONDANSETRON HCL 4 MG PO TABS: 4.0000 mg | ORAL_TABLET | Freq: Four times a day (QID) | ORAL | 0 refills | Status: DC

## 2023-10-11 NOTE — ED Provider Notes (Signed)
 [place] EMERGENCY DEPARTMENT AT [facility] Provider Note   CSN: [id number] Arrival date & time: 10/11/23  1052     History  Chief Complaint  Patient presents with   Samantha Snyder is a 78 y.o. female.  78 year old female with prior medical history as detailed below presents for evaluation.  Patient reports that she tripped and fell this morning.  She went backwards and struck the back of her head against a ramp.  She denies LOC.  She complains of posterior scalp pain and mild dizziness.  She took Tylenol  during transport to the ED.  She reports that her headache is improved.  She reports prior history of subdural hemorrhage after previous head injury.  She does not take aspirin, Plavix, other anticoagulants.  She denies nausea or vomiting.  She denies neck pain.  She denies extremity injury.  After the fall she was ambulatory.  The history is provided by the patient.       Home Medications Prior to Admission medications   Medication Sig Start Date End Date Taking? Authorizing Provider  acetaminophen  (TYLENOL ) 500 MG tablet Take 500 mg by mouth in the morning and at bedtime.    [provider]  Alcohol  Swabs  (DROPSAFE ALCOHOL  PREP) 70 % PADS USE TO CLEANSE ARE BEFORE CHECKING BLOOD SUGAR. 06/24/23   Almira Jaeger, MD  alendronate  (FOSAMAX ) 70 MG tablet TAKE 1 TABLET BY MOUTH EVERY 7 DAYS. TAKE WITH A FULL GLASS OF WATER ON AN EMPTY STOMACH. 07/17/23   Almira Jaeger, MD  Alpha-Lipoic Acid 600 MG CAPS TAKE 1 CAPSULE (600 MG TOTAL) BY MOUTH DAILY 11/19/22   Almira Jaeger, MD  atorvastatin  (LIPITOR) 20 MG tablet TAKE 1 TABLET EVERY DAY 01/20/23   Almira Jaeger, MD  Blood Glucose Monitoring Suppl (TRUE METRIX AIR GLUCOSE METER) w/Device KIT USE TO TEST BLOOD SUGARS DAILY 08/29/22   Almira Jaeger, MD  Calcium  Carbonate (CALCIUM  600 PO) Take 1,200 mg by mouth at bedtime.    [provider]  Cholecalciferol (VITAMIN D3) 2000 UNITS  TABS Take 2,000 Units by mouth at bedtime.    [provider]  diclofenac  Sodium (VOLTAREN ) 1 % GEL APPLY 2 GRAMS TOPICALLY TO PAINFUL SITES OF FEET AND HANDS 4 TIMES A DAY AS NEEDED 07/21/23   Almira Jaeger, MD  fexofenadine (ALLEGRA) 180 MG tablet Take 180 mg by mouth daily.    [provider]  lisinopril  (ZESTRIL ) 5 MG tablet TAKE 1 TABLET EVERY DAY 01/20/23   Almira Jaeger, MD  Magnesium 500 MG CAPS Take 500 mg by mouth at bedtime.    [provider]  metFORMIN  (GLUCOPHAGE ) 1000 MG tablet TAKE 1 TABLET TWICE DAILY WITH MEALS 01/20/23   Almira Jaeger, MD  Multiple Vitamins-Minerals (HAIR SKIN AND NAILS FORMULA PO) Take 2 tablets by mouth daily.    [provider]  Multiple Vitamins-Minerals (MULTIVITAMIN PO) Take 1 tablet by mouth at bedtime.    [provider]  Multiple Vitamins-Minerals (PRESERVISION AREDS 2) CAPS Take by mouth. For macular degeneration twice daily    [provider]  Omega-3 Fatty Acids (FISH OIL PO) Take 2,000 mg by mouth in the morning and at bedtime.    [provider]  OVER THE COUNTER MEDICATION Take 240 mLs by mouth See admin instructions. Herbal tea- Drink 8 ounces by mouth daily    [provider]  Probiotic Product (PROBIOTIC DAILY PO) Take 1 capsule by  mouth every evening.    [provider]  Semaglutide ,0.25 or 0.5MG /DOS, (OZEMPIC , 0.25 OR 0.5 MG/DOSE,) 2 MG/3ML SOPN INJECT 0.5MG  AS DIRECTED ONCE WEEKLY 08/11/23   Almira Jaeger, MD  TRUE METRIX BLOOD GLUCOSE TEST test strip TEST BLOOD SUGAR UP TO FOUR TIMES DAILY 05/01/23   Almira Jaeger, MD  TRUEplus Lancets 33G MISC TEST BLOOD SUGAR UP TO FOUR TIMES DAILY 05/01/23   Almira Jaeger, MD      Allergies    Fluoride preparations    Review of Systems   Review of Systems  All other systems reviewed and are negative.   Physical Exam Updated Vital Signs BP (!) 176/84 (BP Location: Right Arm)   Pulse 86   Temp 98 F  (36.7 C)   Resp 16   LMP  (LMP Unknown)   SpO2 92%  Physical Exam Vitals and nursing note reviewed.  Constitutional:      General: She is not in acute distress.    Appearance: Normal appearance. She is well-developed.  HENT:     Head: Normocephalic and atraumatic.  Eyes:     Conjunctiva/sclera: Conjunctivae normal.     Pupils: Pupils are equal, round, and reactive to light.  Neck:     Comments: Cervical collar in place.  No posterior midline tenderness.  Patient is attempting to range her neck despite having a collar on.  Patient is advised to not do that until CT imaging complete. Cardiovascular:     Rate and Rhythm: Normal rate and regular rhythm.     Heart sounds: Normal heart sounds.  Pulmonary:     Effort: Pulmonary effort is normal. No respiratory distress.     Breath sounds: Normal breath sounds.  Abdominal:     General: There is no distension.     Palpations: Abdomen is soft.     Tenderness: There is no abdominal tenderness.  Musculoskeletal:        General: No deformity. Normal range of motion.     Cervical back: Normal range of motion.  Skin:    General: Skin is warm and dry.  Neurological:     General: No focal deficit present.     Mental Status: She is alert and oriented to person, place, and time.     ED Results / Procedures / Treatments   Labs (all labs ordered are listed, but only abnormal results are displayed) Labs Reviewed - No data to display  EKG None  Radiology No results found.  Procedures Procedures    Medications Ordered in ED Medications - No data to display  ED Course/ Medical Decision Making/ A&P                                 Medical Decision Making Amount and/or Complexity of Data Reviewed Radiology: ordered.  Risk Prescription drug management.    Medical Screen Complete  This patient presented to the ED with complaint of fall, head injury.  This complaint involves an extensive number of treatment options. The  initial differential diagnosis includes, but is not limited to, trauma from fall  This presentation is: Acute, Self-Limited, Previously Undiagnosed, Uncertain Prognosis, Complicated, Systemic Symptoms, and Threat to Life/Bodily Function  Patient presents from home after accidental fall.  She did strike her head.  Patient is neurologically intact.  GCS is 15.  Imaging is without evidence of acute abnormality.  Patient is reassured and comfortable at time of  discharge.  Importance of close follow-up stressed.  Strict return precautions given and understood.  Co morbidities that complicated the patient's evaluation  See HPI   Additional history obtained:  External records from outside sources obtained and reviewed including prior ED visits and prior Inpatient records.    Problem List / ED Course:  Fall, head injury   Reevaluation:  After the interventions noted above, I reevaluated the patient and found that they have: improved  Disposition:  After consideration of the diagnostic results and the patients response to treatment, I feel that the patent would benefit from close outpatient follow-up.          Final Clinical Impression(s) / ED Diagnoses Final diagnoses:  Fall, initial encounter  Injury of head, initial encounter    Rx / DC Orders ED Discharge Orders          Ordered    ondansetron  (ZOFRAN ) 4 MG tablet  Every 6 hours        10/11/23 1314              Burnette Carte, MD 10/11/23 1315

## 2023-10-11 NOTE — ED Triage Notes (Addendum)
 Pt presents to the ED via EMS from home following a trip and fall this morning while stepping back off of a ramp. Patient states she did hit the back of her head. Pt expresses dizziness. Denies LOC and blood thinner usage. Aox4 upon arrival. EMS administered 650mg  Tylenol  PO en route.

## 2023-10-11 NOTE — Discharge Instructions (Signed)
 Return for any problem.  ?

## 2023-10-24 ENCOUNTER — Encounter: Payer: Self-pay | Admitting: Family Medicine

## 2023-10-24 ENCOUNTER — Ambulatory Visit: Admitting: Family Medicine

## 2023-10-24 VITALS — BP 120/74 | HR 90 | Temp 97.0°F | Ht 68.0 in | Wt 190.6 lb

## 2023-10-24 DIAGNOSIS — I10 Essential (primary) hypertension: Secondary | ICD-10-CM | POA: Diagnosis not present

## 2023-10-24 DIAGNOSIS — E1142 Type 2 diabetes mellitus with diabetic polyneuropathy: Secondary | ICD-10-CM

## 2023-10-24 DIAGNOSIS — Z9181 History of falling: Secondary | ICD-10-CM | POA: Diagnosis not present

## 2023-10-24 DIAGNOSIS — R2689 Other abnormalities of gait and mobility: Secondary | ICD-10-CM

## 2023-10-24 NOTE — Patient Instructions (Addendum)
 We have placed a referral for you today to physical therapy at [facility]- please call their # if you do not hear within a week (may be listed below or you may see mychart message within a few days with #).   Glad CTs were ok! Lets us  know if new or worsening symptoms  Recommended follow up: Return for next already scheduled visit or sooner if needed.

## 2023-10-24 NOTE — Progress Notes (Signed)
 Phone [phone] In person visit   Subjective:   Samantha Snyder is a 78 y.o. year old very pleasant female patient who presents for/with See problem oriented charting Chief Complaint  Patient presents with   hosp f/u    Pt is here for hosp f/u due to falls    Past Medical History-  Patient Active Problem List   Diagnosis Date Noted   History of subdural hematoma 10/05/2021    Priority: High   DM (diabetes mellitus) type II controlled, neurological manifestation (HCC) 02/24/2014    Priority: High   Tobacco abuse 02/24/2014    Priority: High   Aortic atherosclerosis (HCC) 07/08/2017    Priority: Medium    Osteoporosis 03/30/2014    Priority: Medium    Essential hypertension 03/08/2014    Priority: Medium    Hyperlipidemia associated with type 2 diabetes mellitus (HCC) 02/24/2014    Priority: Medium    Emphysema lung (HCC) 12/19/2020    Priority: Low   Diabetic peripheral neuropathy (HCC) 10/27/2014    Priority: Low   Seasonal allergies 02/24/2014    Priority: Low    Medications- reviewed and updated Current Outpatient Medications  Medication Sig Dispense Refill   acetaminophen  (TYLENOL ) 500 MG tablet Take 500 mg by mouth in the morning and at bedtime.     Alcohol  Swabs  (DROPSAFE ALCOHOL  PREP) 70 % PADS USE TO CLEANSE ARE BEFORE CHECKING BLOOD SUGAR. 400 each 3   alendronate  (FOSAMAX ) 70 MG tablet TAKE 1 TABLET BY MOUTH EVERY 7 DAYS. TAKE WITH A FULL GLASS OF WATER ON AN EMPTY STOMACH. 12 tablet 3   Alpha-Lipoic Acid 600 MG CAPS TAKE 1 CAPSULE (600 MG TOTAL) BY MOUTH DAILY 60 capsule 5   atorvastatin  (LIPITOR) 20 MG tablet TAKE 1 TABLET EVERY DAY 90 tablet 3   Blood Glucose Monitoring Suppl (TRUE METRIX AIR GLUCOSE METER) w/Device KIT USE TO TEST BLOOD SUGARS DAILY 1 kit 3   Calcium  Carbonate (CALCIUM  600 PO) Take 1,200 mg by mouth at bedtime.     Cholecalciferol (VITAMIN D3) 2000 UNITS TABS Take 2,000 Units by mouth at bedtime.     diclofenac  Sodium (VOLTAREN ) 1 %  GEL APPLY 2 GRAMS TOPICALLY TO PAINFUL SITES OF FEET AND HANDS 4 TIMES A DAY AS NEEDED 400 g 5   fexofenadine (ALLEGRA) 180 MG tablet Take 180 mg by mouth daily.     lisinopril  (ZESTRIL ) 5 MG tablet TAKE 1 TABLET EVERY DAY 90 tablet 3   Magnesium 500 MG CAPS Take 500 mg by mouth at bedtime.     metFORMIN  (GLUCOPHAGE ) 1000 MG tablet TAKE 1 TABLET TWICE DAILY WITH MEALS 180 tablet 3   Multiple Vitamins-Minerals (HAIR SKIN AND NAILS FORMULA PO) Take 2 tablets by mouth daily.     Multiple Vitamins-Minerals (MULTIVITAMIN PO) Take 1 tablet by mouth at bedtime.     Multiple Vitamins-Minerals (PRESERVISION AREDS 2) CAPS Take by mouth. For macular degeneration twice daily     Omega-3 Fatty Acids (FISH OIL PO) Take 2,000 mg by mouth in the morning and at bedtime.     OVER THE COUNTER MEDICATION Take 240 mLs by mouth See admin instructions. Herbal tea- Drink 8 ounces by mouth daily     Probiotic Product (PROBIOTIC DAILY PO) Take 1 capsule by mouth every evening.     Semaglutide ,0.25 or 0.5MG /DOS, (OZEMPIC , 0.25 OR 0.5 MG/DOSE,) 2 MG/3ML SOPN INJECT 0.5MG  AS DIRECTED ONCE WEEKLY 9 mL 3   TRUE METRIX BLOOD GLUCOSE TEST test strip TEST BLOOD  SUGAR UP TO FOUR TIMES DAILY 350 strip 3   TRUEplus Lancets 33G MISC TEST BLOOD SUGAR UP TO FOUR TIMES DAILY 400 each 3   No current facility-administered medications for this visit.     Objective:  BP 120/74   Pulse 90   Temp (!) 97 F (36.1 C)   Ht 5\' 8"  (1.727 m)   Wt 190 lb 9.6 oz (86.5 kg)   LMP  (LMP Unknown)   SpO2 95%   BMI 28.98 kg/m  Gen: NAD, resting comfortably CV: RRR no murmurs rubs or gallops Lungs: CTAB no crackles, wheeze, rhonchi Pain along lower ribs on the back with palpation Ext: no edema Skin: warm, dry-healed scalp abrasion with no surrounding swelling Neuro: Right slowly from chair-using cane    Assessment and Plan   # Emergency department follow-up S: Patient presented to the emergency department on 10/11/2023-she tripped and  fell backwards that morning and struck the back of her head- was cleaning out gutters and when moved off ramp nicked corner of it then fell backwards onto concreate on back of head.  No loss of consciousness.  Had posterior scalp pain and mild dizziness.  Tylenol  during transport to the emergency department.  Headache had improved by time of evaluation.  Due to prior subdural hemorrhage after previous head injury she had a head CT and neck CT and thankfully no acute or traumatic findings were noted with no residual or recurrent subdural hematomas.  She did have some degenerative disc disease in the cervical spine without fracture. Was told had concussion- did have dizziness -Of note on 10/16/2021 did have MRI of the brain that showed subdural hemorrhage as well as smaller volume adjacent sulcal subarachnoid hemorrhage-no mass or evidence of stroke   Today she reports, did develop a lump and iced it for several days and also iced her left mid back- hit that as well- wonders if bruised rib- hurts with deep breaths. Thankfully no dizziness. Has taken tylenol  for scalp sensitivity and ribs- [place]. No blurry vision. No deeper headaches- more scalp sensitivity. Swelling has gone back down on scalp  -She also [place] where wind blew heavily and another 1 where she slid out of her walker seat on the pier-she needed help to get up in both instances A/P: Patient with recent head trauma but thankfully reassuring head CT and neck CT.  The biggest focus here should be reducing risk of falls going forward - Has seen physical therapy in our office in the past but is interested in seeing therapist with neurological focus due to her neuropathy-refer to [facility].  I think she needs help with balance but also overall strength - they are now getting guy to come clean gutters so she doesn't have to- I am thankful for this- we definitely want to  reduce fall risk- she has osteoporosis and we cannot afford more falls -Reports mainly walking with walker but walking with cane today and feels pretty stable-with her fall history encouraged her if she feels off balance at all to stick with the walker close can get PT opinion  # Bruised rib-also suspect bruised rib from the fall-discussed option of x-ray but likely low yield and we opted out with no shortness of breath above baseline  #hypertension S: medication: Lisinopril  5Mg  BP Readings from Last 3 Encounters:  10/24/23 120/74  10/11/23 (!) 162/87  08/29/23 122/70  A/P:  blood pressure elevated in Emergency Department but that has come back down  # Diabetic neuropathy-patient continues to take alpha lipoic acid and also uses Voltaren  gel topically.  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 - Overall stable but likely contributing to fall-continue current medication but also refer to PT as above  Recommended follow up: Return for next already scheduled visit or sooner if needed. Future Appointments  Date Time Provider Department Center  03/02/2024  8:20 AM Almira Jaeger, MD [facility]  04/19/2024  1:40 PM [facility] ANNUAL WELLNESS VISIT 1 [facility]    Lab/Order associations:   ICD-10-CM   1. Balance disorder  R26.89 Ambulatory referral to Physical Therapy    2. Personal history of fall  Z91.81 Ambulatory referral to Physical Therapy    3. Diabetic peripheral neuropathy (HCC)  E11.42 Ambulatory referral to Physical Therapy    4. Essential hypertension  I10       No orders of the defined types were placed in this encounter.   Return precautions advised.  Clarisa Crooked, MD

## 2023-11-03 ENCOUNTER — Telehealth: Payer: Self-pay

## 2023-11-03 NOTE — Telephone Encounter (Signed)
 This paperwork was faxed on 10/15/23 then sent to scan.  Copied from CRM [id number]. Topic: General - Other >> Oct 29, 2023  2:16 PM Juleen Oakland F wrote: Reason for CRM: Jacqlyn Matas from [employer] [facility] [employer] called to follow up on a fax sent in April regarding a script for patient diabetic shoes and inserts. Please call her at [phone] to confirm that fax was received and is being worked on or a script/referral for patients diabetic shoes/inserts can be faxed to [phone].

## 2023-11-04 ENCOUNTER — Other Ambulatory Visit: Payer: Self-pay

## 2023-11-04 ENCOUNTER — Ambulatory Visit: Attending: Family Medicine

## 2023-11-04 DIAGNOSIS — Z9181 History of falling: Secondary | ICD-10-CM | POA: Insufficient documentation

## 2023-11-04 DIAGNOSIS — M6281 Muscle weakness (generalized): Secondary | ICD-10-CM | POA: Diagnosis not present

## 2023-11-04 DIAGNOSIS — R2689 Other abnormalities of gait and mobility: Secondary | ICD-10-CM | POA: Diagnosis not present

## 2023-11-04 DIAGNOSIS — R2681 Unsteadiness on feet: Secondary | ICD-10-CM | POA: Diagnosis not present

## 2023-11-04 DIAGNOSIS — E1142 Type 2 diabetes mellitus with diabetic polyneuropathy: Secondary | ICD-10-CM | POA: Insufficient documentation

## 2023-11-04 DIAGNOSIS — R262 Difficulty in walking, not elsewhere classified: Secondary | ICD-10-CM | POA: Insufficient documentation

## 2023-11-04 NOTE — Therapy (Signed)
 OUTPATIENT PHYSICAL THERAPY VESTIBULAR EVALUATION     Patient Name: Samantha Snyder MRN: [id number] DOB:1945/10/02, 78 y.o., female Today's Date: 11/04/2023  END OF SESSION:  PT End of Session - 11/04/23 0847     Visit Number 1    Number of Visits 13    Date for PT Re-Evaluation 12/16/23    Authorization Type [employer]    Progress Note Due on Visit 10    PT Start Time 0845    PT Stop Time 0930    PT Time Calculation (min) 45 min             Past Medical History:  Diagnosis Date   Allergy Teenager   minor hay fever   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 per x ray    History of UTI 2014   per pt noted after taking Bactrim due to tooth infection   Hyperlipidemia    Neuromuscular disorder (HCC) 2010   neuropathy both feet    Osteopenia    Osteoporosis 2022   Osteoporosis   Right adrenal mass (HCC)    Biopsied in 2008 and benign   Vaginal prolapse    Status post surgery. Improved urinary incontinence   Past Surgical History:  Procedure Laterality Date   ABDOMINAL HYSTERECTOMY  09-07-2013   ABDOMINAL SACROCOLPOPEXY  09/07/2013   BLADDER SURGERY  09/07/2013   vaginal prolapse   BREAST BIOPSY Right    2014? near nipple. benign.   COLONOSCOPY  11/06/2005   sig tics, no polyps-[facility]    CYSTOSCOPY  09/07/2013   ENTEROCELE REPAIR  09/07/2013   INDUCED ABORTION  1985   Therapeutic   LAPAROSCOPIC BILATERAL SALPINGO OOPHERECTOMY  09/07/2013   POLYPECTOMY     PUBOVAGINAL SLING  09/07/2013   TONSILLECTOMY Bilateral 1955   TUBAL LIGATION  07/31/1984   Patient Active Problem List   Diagnosis Date Noted   History of subdural hematoma 10/05/2021   Emphysema  lung (HCC) 12/19/2020   Aortic atherosclerosis (HCC) 07/08/2017   Diabetic peripheral neuropathy (HCC) 10/27/2014   Osteoporosis 03/30/2014   Essential hypertension 03/08/2014   DM (diabetes mellitus) type II controlled, neurological manifestation (HCC) 02/24/2014   Seasonal allergies 02/24/2014   Hyperlipidemia associated with type 2 diabetes mellitus (HCC) 02/24/2014   Tobacco abuse 02/24/2014    PCP: Almira Jaeger, MD REFERRING PROVIDER: Almira Jaeger, MD  REFERRING DIAG: R26.89 (ICD-10-CM) - Balance disorder Z91.81 (ICD-10-CM) - Personal history of fall E11.42 (ICD-10-CM) - Diabetic peripheral neuropathy  THERAPY DIAG:  Difficulty in walking, not elsewhere classified  Unsteadiness on feet  Muscle weakness (generalized)  Other abnormalities of gait and mobility  ONSET DATE: 10/11/23  Rationale for Evaluation and Treatment: Rehabilitation  SUBJECTIVE:   SUBJECTIVE STATEMENT: Had a trip and backwards fall in the backyard striking head. Thankfully no fractures or additional SDH sustained. Still having some pain in the left ribs from the fall.  Denies any neck pain at present. Notes some intermittent dizziness (lightheadedness) when getting up in AM and notes this improves after a few steps, no dizziness when laying down.  Has BLE neuropathy and believes it  extends part way up her lower leg from her feet (ascending).  Has been receiving laser treatments for BLE neuropathy. Denies any feeling of HA, nausea, fogginess, photo/phonophobia. Chief complaint is balance deficits and difficulty with walking Pt accompanied by: self  PERTINENT HISTORY: Patient presented to the emergency department on 10/11/2023-she tripped and fell backwards that morning and struck the back of her head- was cleaning out gutters and when moved off ramp nicked corner of it then fell backwards onto concreate on back of head.  No loss of consciousness.  Had posterior scalp pain and mild dizziness. Prior  subdural hemorrhage after previous head injury she had a head CT and neck CT and thankfully no acute or traumatic findings were noted with no residual or recurrent subdural hematomas.  She did have some degenerative disc disease in the cervical spine without fracture. Was told had concussion- did have dizziness  PAIN:  Are you having pain? Yes: NPRS scale: 3-4/10 Pain location: soreness on top of head, left flank Pain description: sore Aggravating factors: trunk rotation, deep breaths Relieving factors: Tylenol   PRECAUTIONS: Fall  RED FLAGS: None   WEIGHT BEARING RESTRICTIONS: No  FALLS: Has patient fallen in last 6 months? Yes. Number of falls 3  LIVING ENVIRONMENT: Lives with: lives with their spouse Lives in: House/apartment Stairs: ramp to enter/exit home Has following equipment at home: Single point cane and Walker - 4 wheeled  PLOF: Independent with basic ADLs, Independent with household mobility without device, and step-in shower w/ grab bars  PATIENT GOALS: improve balance,   OBJECTIVE:  Note: Objective measures were completed at Evaluation unless otherwise noted.  DIAGNOSTIC FINDINGS:   COGNITION: Overall cognitive status: Within functional limits for tasks assessed   SENSATION: Notes plantar surface of feet is numb, tingling/burning extend mid-calf  EDEMA:  none  MUSCLE TONE:    DTRs:    POSTURE:  rounded shoulders, forward head, and increased thoracic kyphosis  Cervical ROM:    Grossly limited all planes from postural fault  STRENGTH:   LOWER EXTREMITY MMT:   MMT Right eval Left eval  Hip flexion 3+ 3+  Hip abduction 3- 3  Hip adduction 4 4  Hip internal rotation    Hip external rotation    Knee flexion 4 4  Knee extension 4- 4  Ankle dorsiflexion 3+ 4  Ankle plantarflexion    Ankle inversion    Ankle eversion    (Blank rows = not tested)  BED MOBILITY:  Reports indep  TRANSFERS: Assistive device utilized: None  Sit to stand:  Modified independence Stand to sit: Modified independence Chair to chair: Modified independence Floor: NT--reports requires assistance   CURB: CGA  GAIT: Gait pattern: decreased stride length and poor foot clearance- Right Distance walked:  Assistive device utilized: Single point cane Level of assistance: SBA Comments:   FUNCTIONAL TESTS:  5 times sit to stand: 26 sec w/ UE support Berg Balance Scale: 39/56 10 meter walk test: 26 sec = 1.2 ft/sec M-CTSIB  Condition 1: Firm Surface, EO 30 Sec, Mild Sway  Condition 2: Firm Surface, EC 30 Sec, Moderate Sway  Condition 3: Foam Surface, EO 30 Sec, Moderate Sway  Condition 4: Foam Surface, EC 3 Sec, Severe Sway     [facility] PT Assessment - 11/04/23 0001       Standardized Balance Assessment   Standardized Balance Assessment Berg Balance Test;Five Times Sit to Stand;10 meter walk test    Five times sit to stand comments  26 sec, UE  support    10 Meter Walk 26 sec = 1.2 ft/sec      Berg Balance Test   Sit to Stand Able to stand  independently using hands    Standing Unsupported Able to stand safely 2 minutes    Sitting with Back Unsupported but Feet Supported on Floor or Stool Able to sit safely and securely 2 minutes    Stand to Sit Controls descent by using hands    Transfers Able to transfer safely, definite need of hands    Standing Unsupported with Eyes Closed Able to stand 10 seconds with supervision    Standing Unsupported with Feet Together Able to place feet together independently and stand for 1 minute with supervision    From Standing, Reach Forward with Outstretched Arm Can reach confidently >25 cm (10")    From Standing Position, Pick up Object from Floor Able to pick up shoe, needs supervision    From Standing Position, Turn to Look Behind Over each Shoulder Looks behind one side only/other side shows less weight shift    Turn 360 Degrees Able to turn 360 degrees safely but slowly    Standing Unsupported, Alternately  Place Feet on Step/Stool Able to complete 4 steps without aid or supervision    Standing Unsupported, One Foot in Front Needs help to step but can hold 15 seconds    Standing on One Leg Tries to lift leg/unable to hold 3 seconds but remains standing independently    Total Score 39               VESTIBULAR ASSESSMENT:  GENERAL OBSERVATION: not conducted as pt denies any symptoms consistent with concussion  OTHOSTATICS: not done                                                                                                                              TREATMENT DATE: 11/04/23    PATIENT EDUCATION: Education details: assessment details, HEP initiation Person educated: Patient Education method: Explanation and Handouts Education comprehension: verbalized understanding  HOME EXERCISE PROGRAM: Access Code: [email or web address] URL: [email or web address] Date: 11/04/2023 Prepared by: Tedd Favorite  Exercises - Standing Balance in Corner with Eyes Closed  - 1 x daily - 7 x weekly - 3 sets - 30 sec hold - Corner Balance Feet Apart: Eyes Open With Head Turns  - 1 x daily - 7 x weekly - 3 sets - 3-5 reps - Corner Balance Feet Together: Eyes Closed With Head Turns  - 1 x daily - 7 x weekly - 3 sets - 3-5 reps  GOALS: Goals reviewed with patient? Yes  SHORT TERM GOALS: Target date: 11/25/2023    Patient will be independent in HEP to improve functional outcomes Baseline: Goal status: INITIAL  2.  Demo improved BLE strength and reduce risk for falls as evident by 5xSTS test in 20 sec w/out UE  Baseline: 26 sec w/ UE support Goal status: INITIAL  3.  Demo  improved postural stability per mild sway x 30 sec condition 2 M-CTSIB to improve safety with ADL Baseline: moderate sway x 30 Goal status: INITIAL    LONG TERM GOALS: Target date: 12/16/2023    Demo low risk for falls per score 47/56 Berg Balance Test Baseline: 39/56 Goal status: INITIAL  2.  Demo improved safety  and efficiency with ambulation with adequate right foot clearance and sustaining speed of 2.8 ft/sec Baseline: 1.2 ft/sec w/ cane Goal status: INITIAL  3.  Demo improved postural stability per mild sway x 30 sec condition 4 M-CTSIB to improve safety with ADL Baseline: severe x 3 sec Goal status: INITIAL  4.  Improve right hip abduction strength to 4/5 for improved stability/single limb support Baseline: 3-/5 Goal status: INITIAL    ASSESSMENT:  CLINICAL IMPRESSION: Patient is a 78 y.o. lady who was seen today for physical therapy evaluation and treatment for balance and mobility deficits.  Has hx of peripheral neuropathy and recent hx of falling and striking head.  At present, it does not seem she is experiencing any lingering deficits related to concussion/head trauma.  Her chief complaint is balance deficits and LE weakness which is affecting her mobility and increasing risk for falls.  Outcome measures reveal high risk for falls and gait/posture deviations present with poor right foot clearance in swing phase and decreased gait speed/stride length requiring increased double limb support.  Poor proprioception and postural stability evident by assessment.  Pt would benefit from PT sessions to address deficits/limitations to improve safety with functional mobility and reduce risk for falls.    OBJECTIVE IMPAIRMENTS: Abnormal gait, decreased activity tolerance, decreased balance, decreased mobility, difficulty walking, decreased strength, and postural dysfunction.   ACTIVITY LIMITATIONS: carrying, lifting, bending, squatting, stairs, transfers, reach over head, and locomotion level  PARTICIPATION LIMITATIONS: meal prep, cleaning, laundry, driving, shopping, community activity, and yard work  PERSONAL FACTORS: Age, Time since onset of injury/illness/exacerbation, and 3+ comorbidities: PMH including DM, neuropathy, hx of falls/SDH are also affecting patient's functional outcome.   REHAB  POTENTIAL: Good  CLINICAL DECISION MAKING: Evolving/moderate complexity  EVALUATION COMPLEXITY: Moderate   PLAN:  PT FREQUENCY: 1-2x/week  PT DURATION: 6 weeks  PLANNED INTERVENTIONS: 97750- Physical Performance Testing, 97110-Therapeutic exercises, 97530- Therapeutic activity, [id number]- Neuromuscular re-education, 97535- Self Care, 52841- Manual therapy, [id number]- Gait training, [id number]- Orthotic Initial, [id number]- Orthotic/Prosthetic subsequent, [phone]- Canalith repositioning, [id number]- Aquatic Therapy, and N0272- Electrical stimulation (unattended)  PLAN FOR NEXT SESSION: HEP review, strength training additions for HEP  10:54 AM, 11/04/23 M. Kelly Emilian Stawicki, PT, DPT Physical Therapist- [place] Office Number: [phone]    Referring diagnosis? R26.89 (ICD-10-CM) - Balance disorder Z91.81 (ICD-10-CM) - Personal history of fall E11.42 (ICD-10-CM) - Diabetic peripheral neuropathy Treatment diagnosis? (if different than referring diagnosis) THERAPY DIAG:  Difficulty in walking, not elsewhere classified  Unsteadiness on feet  Muscle weakness (generalized)  Other abnormalities of gait and mobility  What was this (referring dx) caused by? []  Surgery [x]  Fall [x]  Ongoing issue []  Arthritis []  Other: ____________  Laterality: []  Rt []  Lt [x]  Both  Check all possible CPT codes:  *CHOOSE 10 OR LESS*    See Planned Interventions listed in the Plan section of the Evaluation.

## 2023-11-08 ENCOUNTER — Other Ambulatory Visit: Payer: Self-pay | Admitting: Family Medicine

## 2023-11-11 ENCOUNTER — Encounter: Payer: Self-pay | Admitting: Physical Therapy

## 2023-11-11 ENCOUNTER — Ambulatory Visit: Admitting: Physical Therapy

## 2023-11-11 DIAGNOSIS — R2689 Other abnormalities of gait and mobility: Secondary | ICD-10-CM | POA: Diagnosis not present

## 2023-11-11 DIAGNOSIS — R262 Difficulty in walking, not elsewhere classified: Secondary | ICD-10-CM | POA: Diagnosis not present

## 2023-11-11 DIAGNOSIS — E1142 Type 2 diabetes mellitus with diabetic polyneuropathy: Secondary | ICD-10-CM | POA: Diagnosis not present

## 2023-11-11 DIAGNOSIS — R2681 Unsteadiness on feet: Secondary | ICD-10-CM | POA: Diagnosis not present

## 2023-11-11 DIAGNOSIS — Z9181 History of falling: Secondary | ICD-10-CM | POA: Diagnosis not present

## 2023-11-11 DIAGNOSIS — M6281 Muscle weakness (generalized): Secondary | ICD-10-CM | POA: Diagnosis not present

## 2023-11-11 NOTE — Therapy (Signed)
 OUTPATIENT PHYSICAL THERAPY VESTIBULAR TREATMENT NOTE     Patient Name: Samantha Snyder MRN: [id number] DOB:October 10, 1945, 78 y.o., female Today's Date: 11/11/2023  END OF SESSION:  PT End of Session - 11/11/23 0928     Visit Number 2    Number of Visits 13    Date for PT Re-Evaluation 12/16/23    Authorization Type [employer]    Authorization Time Period 11/04/2023-12/16/2023    Authorization - Visit Number 2    Authorization - Number of Visits 13    Progress Note Due on Visit 10    PT Start Time 0931    PT Stop Time 1012    PT Time Calculation (min) 41 min    Equipment Utilized During Treatment Gait belt    Activity Tolerance Patient tolerated treatment well    Behavior During Therapy WFL for tasks assessed/performed              Past Medical History:  Diagnosis Date   Allergy Teenager   minor hay fever   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 per x ray    History of UTI 2014   per pt noted after taking Bactrim due to tooth infection   Hyperlipidemia    Neuromuscular disorder (HCC) 2010   neuropathy both feet    Osteopenia    Osteoporosis 2022   Osteoporosis   Right adrenal mass (HCC)    Biopsied in 2008 and benign   Vaginal prolapse    Status post surgery. Improved urinary incontinence   Past Surgical History:  Procedure Laterality Date   ABDOMINAL HYSTERECTOMY  09-07-2013   ABDOMINAL SACROCOLPOPEXY  09/07/2013   BLADDER SURGERY  09/07/2013   vaginal prolapse   BREAST BIOPSY Right    2014? near nipple. benign.   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   Aortic atherosclerosis (HCC) 07/08/2017   Diabetic peripheral neuropathy (HCC) 10/27/2014   Osteoporosis 03/30/2014   Essential hypertension 03/08/2014   DM (diabetes mellitus) type II controlled, neurological manifestation (HCC) 02/24/2014   Seasonal allergies 02/24/2014   Hyperlipidemia associated with type 2 diabetes mellitus (HCC) 02/24/2014   Tobacco abuse 02/24/2014    PCP: Almira Jaeger, MD REFERRING PROVIDER: Almira Jaeger, MD  REFERRING DIAG: R26.89 (ICD-10-CM) - Balance disorder Z91.81 (ICD-10-CM) - Personal history of fall E11.42 (ICD-10-CM) - Diabetic peripheral neuropathy  THERAPY DIAG:  Unsteadiness on feet  Muscle weakness (generalized)  Other abnormalities of gait and mobility  ONSET DATE: 10/11/23  Rationale for Evaluation and Treatment: Rehabilitation  SUBJECTIVE:   SUBJECTIVE STATEMENT: Did my exercises with eyes open/eyes closed.  I feel the ribs may have healed, no pain.  Pt accompanied by: self  PERTINENT HISTORY: Patient presented to the emergency department on 10/11/2023-she tripped and fell backwards that morning and struck the back of her head- was cleaning out gutters and when moved off ramp nicked corner of it then fell backwards onto concreate on back of head.  No loss of consciousness.  Had posterior scalp pain and mild dizziness. Prior subdural hemorrhage after previous head injury she had a head CT and neck CT and thankfully no acute or traumatic findings were noted with no residual or recurrent subdural hematomas.  She did have some degenerative disc disease in the cervical spine without fracture. Was told had concussion- did  have dizziness  PAIN:  Are you having pain? No  PRECAUTIONS: Fall  RED FLAGS: None   WEIGHT BEARING RESTRICTIONS: No  FALLS: Has patient fallen in last 6 months? Yes. Number of falls 3  LIVING ENVIRONMENT: Lives with: lives with their spouse Lives in: House/apartment Stairs: ramp to enter/exit home Has following equipment at home: Single point cane and Walker - 4 wheeled  PLOF: Independent with basic ADLs, Independent with household mobility without device, and step-in shower w/ grab bars  PATIENT GOALS: improve balance,   OBJECTIVE:   TODAY'S TREATMENT: 11/11/2023 Activity Comments  HEP review:  counter balance EO + head turns/nods EC, then EC + head turns/nods-feet apart and feet together Good return demo  Heel/toe raises, 2 x 10 Wide BOS lateral weightshift Good form  Sidestep/together, R and L, 10 reps UE support to allow for improved foot clearance/step length  SLS, 3 x 10 sec Tandem stance, 2 x 30", then EC 30 sec each BUE support for stability  Forward/back stepping at counter, 10 reps each BUE>1 UE support, more difficulty with RLE as stance      HOME EXERCISE PROGRAM: Access Code: [email or web address] URL: [email or web address] Date: 11/11/2023 Prepared by: [facility] - Outpatient  Rehab - [facility]  Exercises - Standing Balance in Corner with Eyes Closed  - 1 x daily - 7 x weekly - 3 sets - 30 sec hold - Corner Balance Feet Apart: Eyes Open With Head Turns  - 1 x daily - 7 x weekly - 3 sets - 3-5 reps - Corner Balance Feet Together: Eyes Closed With Head Turns  - 1 x daily - 7 x weekly - 3 sets - 3-5 reps - Heel Toe Raises with Counter Support  - 1 x daily - 7 x weekly - 2-3 sets - 10 reps - 3 sec hold - Side Stepping with Counter Support  - 1 x daily - 7 x weekly - 2 sets - 10 reps - Single Leg Stance with Support  - 1 x daily - 7 x weekly - 1 sets - 3 reps - 10 sec hold - Standing Tandem Balance with Counter Support  - 1 x daily - 7 x weekly - 1  sets - 3 reps - 30 sec hold  PATIENT EDUCATION: Education details: HEP additions Person educated: Patient Education method: Explanation, Demonstration, Verbal cues, and Handouts Education comprehension: verbalized understanding, returned demonstration, and needs further education   --------------------------------------------- Note: Objective measures were completed at Evaluation unless otherwise noted.  DIAGNOSTIC FINDINGS:   COGNITION: Overall cognitive status: Within functional limits for tasks assessed   SENSATION: Notes plantar surface of feet is numb, tingling/burning extend mid-calf  EDEMA:  none  MUSCLE TONE:    DTRs:    POSTURE:  rounded shoulders, forward head, and increased thoracic kyphosis  Cervical ROM:  Grossly limited all planes from postural fault  STRENGTH:   LOWER EXTREMITY MMT:   MMT Right eval Left eval  Hip flexion 3+ 3+  Hip abduction 3- 3  Hip adduction 4 4  Hip internal rotation    Hip external rotation    Knee flexion 4 4  Knee extension 4- 4  Ankle dorsiflexion 3+ 4  Ankle plantarflexion    Ankle inversion    Ankle eversion    (Blank rows = not tested)  BED MOBILITY:  Reports indep  TRANSFERS: Assistive device utilized: None  Sit to stand: Modified independence Stand to sit: Modified independence Chair to chair: Modified independence Floor: NT--reports requires assistance   CURB: CGA  GAIT: Gait pattern: decreased stride length and poor foot clearance- Right Distance walked:  Assistive device utilized: Single point cane Level of assistance: SBA Comments:   FUNCTIONAL TESTS:  5 times sit to stand: 26 sec w/ UE support Berg Balance Scale: 39/56 10 meter walk test: 26 sec = 1.2 ft/sec M-CTSIB  Condition 1: Firm Surface, EO 30 Sec, Mild Sway  Condition 2: Firm Surface, EC 30 Sec, Moderate Sway  Condition 3: Foam Surface, EO 30 Sec, Moderate Sway  Condition 4: Foam Surface, EC 3 Sec, Severe Sway          VESTIBULAR ASSESSMENT:  GENERAL OBSERVATION: not conducted as pt denies any symptoms consistent with concussion  OTHOSTATICS: not done                                                                                                                              GOALS: Goals reviewed with patient? Yes  SHORT TERM GOALS: Target date: 11/25/2023    Patient will be independent in HEP to improve functional outcomes Baseline: Goal status: IN PROGRESS  2.  Demo improved BLE strength and reduce risk for falls as evident by 5xSTS test in 20 sec w/out UE  Baseline: 26 sec w/ UE support Goal status: IN PROGRESS  3.  Demo improved postural stability per mild sway x 30 sec condition 2 M-CTSIB to improve safety with ADL Baseline: moderate sway x 30 Goal status: IN PROGRESS    LONG TERM GOALS: Target date: 12/16/2023    Demo low risk for falls per score 47/56 Berg Balance Test Baseline: 39/56 Goal status: IN PROGRESS  2.  Demo improved safety and efficiency with ambulation with adequate right foot clearance and sustaining speed of 2.8 ft/sec Baseline: 1.2 ft/sec w/ cane Goal status: IN PROGRESS  3.  Demo improved postural stability per mild sway x 30 sec condition 4 M-CTSIB to improve safety with ADL Baseline: severe x 3 sec Goal status: IN PROGRESS  4.  Improve right hip abduction strength to 4/5 for improved stability/single limb support Baseline: 3-/5 Goal status: IN PROGRESS    ASSESSMENT:  CLINICAL IMPRESSION: Pt presents today with no complaints. Skilled PT session focused on review and progression of HEP. Pt return demo her HEP  with good form.  Worked on hip and ankle strengthening exercises for improved balance, with pt needing UE support for optimal stability and for improved foot clearance. Pt will continue to benefit from skilled PT towards goals for improved functional mobility and decreased fall risk.   EVAL:  Patient is a 78 y.o. lady who was seen today for  physical therapy evaluation and treatment for balance and mobility deficits.  Has hx of peripheral neuropathy and recent hx of falling and striking head.  At present, it does not seem she is experiencing any lingering deficits related to concussion/head trauma.  Her chief complaint is balance deficits and LE weakness which is affecting her mobility and increasing risk for falls.  Outcome measures reveal high risk for falls and gait/posture deviations present with poor right foot clearance in swing phase and decreased gait speed/stride length requiring increased double limb support.  Poor proprioception and postural stability evident by assessment.  Pt would benefit from PT sessions to address deficits/limitations to improve safety with functional mobility and reduce risk for falls.    OBJECTIVE IMPAIRMENTS: Abnormal gait, decreased activity tolerance, decreased balance, decreased mobility, difficulty walking, decreased strength, and postural dysfunction.   ACTIVITY LIMITATIONS: carrying, lifting, bending, squatting, stairs, transfers, reach over head, and locomotion level  PARTICIPATION LIMITATIONS: meal prep, cleaning, laundry, driving, shopping, community activity, and yard work  PERSONAL FACTORS: Age, Time since onset of injury/illness/exacerbation, and 3+ comorbidities: PMH including DM, neuropathy, hx of falls/SDH are also affecting patient's functional outcome.   REHAB POTENTIAL: Good  CLINICAL DECISION MAKING: Evolving/moderate complexity  EVALUATION COMPLEXITY: Moderate   PLAN:  PT FREQUENCY: 1-2x/week  PT DURATION: 6 weeks  PLANNED INTERVENTIONS: 97750- Physical Performance Testing, 97110-Therapeutic exercises, 97530- Therapeutic activity, 97112- Neuromuscular re-education, 97535- Self Care, 08657- Manual therapy, [phone]- Gait training, [phone]- Orthotic Initial, [phone]- Orthotic/Prosthetic subsequent, [phone]- Canalith repositioning, [phone]- Aquatic Therapy, and [phone]- Electrical  stimulation (unattended)  PLAN FOR NEXT SESSION: HEP review and progression; strength training additions for [employer], PT 11/11/23 10:14 AM Phone: [phone] Fax: [phone]  [facility] at [facility] [address] [place], [place] [address] Phone # [phone] Fax # [phone]   Referring diagnosis? R26.89 (ICD-10-CM) - Balance disorder Z91.81 (ICD-10-CM) - Personal history of fall E11.42 (ICD-10-CM) - Diabetic peripheral neuropathy Treatment diagnosis? (if different than referring diagnosis) THERAPY DIAG:  Difficulty in walking, not elsewhere classified  Unsteadiness on feet  Muscle weakness (generalized)  Other abnormalities of gait and mobility  What was this (referring dx) caused by? []  Surgery [x]  Fall [x]  Ongoing issue []  Arthritis []  Other: ____________  Laterality: []  Rt []  Lt [x]  Both  Check all possible CPT codes:  *CHOOSE 10 OR LESS*    See Planned Interventions listed in the Plan section of the Evaluation.

## 2023-11-14 ENCOUNTER — Ambulatory Visit

## 2023-11-14 DIAGNOSIS — E1142 Type 2 diabetes mellitus with diabetic polyneuropathy: Secondary | ICD-10-CM | POA: Diagnosis not present

## 2023-11-14 DIAGNOSIS — R262 Difficulty in walking, not elsewhere classified: Secondary | ICD-10-CM | POA: Diagnosis not present

## 2023-11-14 DIAGNOSIS — R2689 Other abnormalities of gait and mobility: Secondary | ICD-10-CM

## 2023-11-14 DIAGNOSIS — M6281 Muscle weakness (generalized): Secondary | ICD-10-CM | POA: Diagnosis not present

## 2023-11-14 DIAGNOSIS — Z9181 History of falling: Secondary | ICD-10-CM | POA: Diagnosis not present

## 2023-11-14 DIAGNOSIS — R2681 Unsteadiness on feet: Secondary | ICD-10-CM

## 2023-11-14 NOTE — Therapy (Signed)
 OUTPATIENT PHYSICAL THERAPY VESTIBULAR TREATMENT NOTE     Patient Name: Samantha Snyder MRN: [id number] DOB:1946-05-30, 78 y.o., female Today's Date: 11/14/2023  END OF SESSION:  PT End of Session - 11/14/23 0922     Visit Number 3    Number of Visits 13    Date for PT Re-Evaluation 12/16/23    Authorization Type [employer]    Authorization Time Period 11/04/2023-12/16/2023    Authorization - Visit Number 3    Authorization - Number of Visits 13    Progress Note Due on Visit 10    PT Start Time 0930    PT Stop Time 1015    PT Time Calculation (min) 45 min    Equipment Utilized During Treatment Gait belt    Activity Tolerance Patient tolerated treatment well    Behavior During Therapy WFL for tasks assessed/performed              Past Medical History:  Diagnosis Date   Allergy Teenager   minor hay fever   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 per x ray    History of UTI 2014   per pt noted after taking Bactrim due to tooth infection   Hyperlipidemia    Neuromuscular disorder (HCC) 2010   neuropathy both feet    Osteopenia    Osteoporosis 2022   Osteoporosis   Right adrenal mass (HCC)    Biopsied in 2008 and benign   Vaginal prolapse    Status post surgery. Improved urinary incontinence   Past Surgical History:  Procedure Laterality Date   ABDOMINAL HYSTERECTOMY  09-07-2013   ABDOMINAL SACROCOLPOPEXY  09/07/2013   BLADDER SURGERY  09/07/2013   vaginal prolapse   BREAST BIOPSY Right    2014? near nipple. benign.   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   Aortic atherosclerosis (HCC) 07/08/2017   Diabetic peripheral neuropathy (HCC) 10/27/2014   Osteoporosis 03/30/2014   Essential hypertension 03/08/2014   DM (diabetes mellitus) type II controlled, neurological manifestation (HCC) 02/24/2014   Seasonal allergies 02/24/2014   Hyperlipidemia associated with type 2 diabetes mellitus (HCC) 02/24/2014   Tobacco abuse 02/24/2014    PCP: Almira Jaeger, MD REFERRING PROVIDER: Almira Jaeger, MD  REFERRING DIAG: R26.89 (ICD-10-CM) - Balance disorder Z91.81 (ICD-10-CM) - Personal history of fall E11.42 (ICD-10-CM) - Diabetic peripheral neuropathy  THERAPY DIAG:  Unsteadiness on feet  Muscle weakness (generalized)  Other abnormalities of gait and mobility  Difficulty in walking, not elsewhere classified  ONSET DATE: 10/11/23  Rationale for Evaluation and Treatment: Rehabilitation  SUBJECTIVE:   SUBJECTIVE STATEMENT: Doing ok, back and ribs are feeling better  Pt accompanied by:  self  PERTINENT HISTORY: Patient presented to the emergency department on 10/11/2023-she tripped and fell backwards that morning and struck the back of her head- was cleaning out gutters and when moved off ramp nicked corner of it then fell backwards onto concreate on back of head.  No loss of consciousness.  Had posterior scalp pain and mild dizziness. Prior subdural hemorrhage after previous head injury she had a head CT and neck CT and thankfully no acute or traumatic findings were noted with no residual or recurrent subdural hematomas.  She did have some degenerative disc disease in the cervical spine without fracture. Was told had concussion-  did have dizziness  PAIN:  Are you having pain? No  PRECAUTIONS: Fall  RED FLAGS: None   WEIGHT BEARING RESTRICTIONS: No  FALLS: Has patient fallen in last 6 months? Yes. Number of falls 3  LIVING ENVIRONMENT: Lives with: lives with their spouse Lives in: House/apartment Stairs: ramp to enter/exit home Has following equipment at home: Single point cane and Walker - 4 wheeled  PLOF: Independent with basic ADLs, Independent with household mobility without device, and step-in shower w/ grab bars  PATIENT GOALS: improve balance,   OBJECTIVE:   TODAY'S TREATMENT: 11/14/23 Activity Comments  LAQ 3x10 3#  Seated hamstring curls 3x10 Green band  Hip add iso 3x10   Seated clamshells 3x10 Green loop  Sidestepping x 2 min 3#  Alt stair taps x 2 min 3#  Static balance -feet together EO/EC x 30 sec. Head turns EO/EC x 30 sec  Single leg stance 3x10 sec At counter with support  Semi-tandem 2x30 sec   SLS with support 3x10 sec   Backwards walk x 2 min At counter  Sit to stand on foam 2x5 Elevated EOM 24"     TODAY'S TREATMENT: 11/11/2023 Activity Comments  HEP review:  counter balance EO + head turns/nods EC, then EC + head turns/nods-feet apart and feet together Good return demo  Heel/toe raises, 2 x 10 Wide BOS lateral weightshift Good form  Sidestep/together, R and L, 10 reps UE support to allow for improved foot clearance/step length  SLS, 3 x 10 sec Tandem stance, 2 x 30", then EC 30 sec each BUE support for stability  Forward/back stepping at counter, 10 reps each BUE>1 UE support, more difficulty with RLE as stance      HOME EXERCISE PROGRAM: Access Code: [email or web address] URL: [email or web address] Date: 11/11/2023 Prepared by: [facility] - Outpatient  Rehab - [facility]  Exercises - Standing Balance in Corner with Eyes Closed  - 1 x daily - 7 x weekly - 3 sets - 30 sec hold - Corner Balance Feet Apart: Eyes Open With Head Turns  - 1 x daily - 7 x weekly  - 3 sets - 3-5 reps - Corner Balance Feet Together: Eyes Closed With Head Turns  - 1 x daily - 7 x weekly - 3 sets - 3-5 reps - Heel Toe Raises with Counter Support  - 1 x daily - 7 x weekly - 2-3 sets - 10 reps - 3 sec hold - Side Stepping with Counter Support  - 1 x daily - 7 x weekly - 2 sets - 10 reps - Single Leg Stance with Support  - 1 x daily - 7 x weekly - 1 sets - 3 reps - 10 sec hold - Standing Tandem Balance with Counter Support  - 1 x daily - 7 x weekly - 1 sets - 3 reps - 30 sec  hold  PATIENT EDUCATION: Education details: HEP additions Person educated: Patient Education method: Explanation, Demonstration, Verbal cues, and Handouts Education comprehension: verbalized understanding, returned demonstration, and needs further education   --------------------------------------------- Note: Objective measures were completed at Evaluation unless otherwise noted.  DIAGNOSTIC FINDINGS:   COGNITION: Overall cognitive status: Within functional limits for tasks assessed   SENSATION: Notes plantar surface of feet is numb, tingling/burning extend mid-calf  EDEMA:  none  MUSCLE TONE:    DTRs:    POSTURE:  rounded shoulders, forward head, and increased thoracic kyphosis  Cervical ROM:    Grossly limited all planes from postural fault  STRENGTH:   LOWER EXTREMITY MMT:   MMT Right eval Left eval  Hip flexion 3+ 3+  Hip abduction 3- 3  Hip adduction 4 4  Hip internal rotation    Hip external rotation    Knee flexion 4 4  Knee extension 4- 4  Ankle dorsiflexion 3+ 4  Ankle plantarflexion    Ankle inversion    Ankle eversion    (Blank rows = not tested)  BED MOBILITY:  Reports indep  TRANSFERS: Assistive device utilized: None  Sit to stand: Modified independence Stand to sit: Modified independence Chair to chair: Modified independence Floor: NT--reports requires assistance   CURB: CGA  GAIT: Gait pattern: decreased stride length and poor foot  clearance- Right Distance walked:  Assistive device utilized: Single point cane Level of assistance: SBA Comments:   FUNCTIONAL TESTS:  5 times sit to stand: 26 sec w/ UE support Berg Balance Scale: 39/56 10 meter walk test: 26 sec = 1.2 ft/sec M-CTSIB  Condition 1: Firm Surface, EO 30 Sec, Mild Sway  Condition 2: Firm Surface, EC 30 Sec, Moderate Sway  Condition 3: Foam Surface, EO 30 Sec, Moderate Sway  Condition 4: Foam Surface, EC 3 Sec, Severe Sway         VESTIBULAR ASSESSMENT:  GENERAL OBSERVATION: not conducted as pt denies any symptoms consistent with concussion  OTHOSTATICS: not done                                                                                                                              GOALS: Goals reviewed with patient? Yes  SHORT TERM GOALS: Target date: 11/25/2023    Patient will be independent in HEP to improve functional outcomes Baseline: Goal status: IN PROGRESS  2.  Demo improved BLE strength and reduce risk for falls as evident by 5xSTS test in 20 sec w/out UE  Baseline: 26 sec w/ UE support Goal status: IN PROGRESS  3.  Demo improved postural stability per mild sway x 30 sec condition 2 M-CTSIB to improve safety with ADL Baseline: moderate sway x 30 Goal status: IN PROGRESS    LONG TERM GOALS: Target date: 12/16/2023    Demo low risk for falls per score 47/56 Berg Balance Test Baseline: 39/56 Goal status: IN PROGRESS  2.  Demo improved safety and efficiency with  ambulation with adequate right foot clearance and sustaining speed of 2.8 ft/sec Baseline: 1.2 ft/sec w/ cane Goal status: IN PROGRESS  3.  Demo improved postural stability per mild sway x 30 sec condition 4 M-CTSIB to improve safety with ADL Baseline: severe x 3 sec Goal status: IN PROGRESS  4.  Improve right hip abduction strength to 4/5 for improved stability/single limb support Baseline: 3-/5 Goal status: IN PROGRESS    ASSESSMENT:  CLINICAL  IMPRESSION: Strength training to improve BLE strength/activity tolerance and for facilitation to improve balance. Dynamic and static balance to facilitate righting reactions, single limb support and tolerance to multisensory conditions.  UE support required for eyes closed conditions but able to progress to brief periods of no support and mild-moderate sway. Continued sessions to progress POC details to improve mobility and reduce risk for falls.   EVAL:  Patient is a 79 y.o. lady who was seen today for physical therapy evaluation and treatment for balance and mobility deficits.  Has hx of peripheral neuropathy and recent hx of falling and striking head.  At present, it does not seem she is experiencing any lingering deficits related to concussion/head trauma.  Her chief complaint is balance deficits and LE weakness which is affecting her mobility and increasing risk for falls.  Outcome measures reveal high risk for falls and gait/posture deviations present with poor right foot clearance in swing phase and decreased gait speed/stride length requiring increased double limb support.  Poor proprioception and postural stability evident by assessment.  Pt would benefit from PT sessions to address deficits/limitations to improve safety with functional mobility and reduce risk for falls.    OBJECTIVE IMPAIRMENTS: Abnormal gait, decreased activity tolerance, decreased balance, decreased mobility, difficulty walking, decreased strength, and postural dysfunction.   ACTIVITY LIMITATIONS: carrying, lifting, bending, squatting, stairs, transfers, reach over head, and locomotion level  PARTICIPATION LIMITATIONS: meal prep, cleaning, laundry, driving, shopping, community activity, and yard work  PERSONAL FACTORS: Age, Time since onset of injury/illness/exacerbation, and 3+ comorbidities: PMH including DM, neuropathy, hx of falls/SDH are also affecting patient's functional outcome.   REHAB POTENTIAL: Good  CLINICAL  DECISION MAKING: Evolving/moderate complexity  EVALUATION COMPLEXITY: Moderate   PLAN:  PT FREQUENCY: 1-2x/week  PT DURATION: 6 weeks  PLANNED INTERVENTIONS: 97750- Physical Performance Testing, 97110-Therapeutic exercises, 97530- Therapeutic activity, [id number]- Neuromuscular re-education, 97535- Self Care, 86578- Manual therapy, [id number]- Gait training, [id number]- Orthotic Initial, [id number]- Orthotic/Prosthetic subsequent, [phone]- Canalith repositioning, [id number]- Aquatic Therapy, and [id number]- Electrical stimulation (unattended)  PLAN FOR NEXT SESSION: strength training additions for HEP  10:53 AM, 11/14/23 M. Kelly Mckinsley Koelzer, PT, DPT Physical Therapist- [place] Office Number: [phone]

## 2023-11-18 DIAGNOSIS — E1141 Type 2 diabetes mellitus with diabetic mononeuropathy: Secondary | ICD-10-CM | POA: Diagnosis not present

## 2023-11-25 ENCOUNTER — Ambulatory Visit: Attending: Family Medicine

## 2023-11-25 DIAGNOSIS — R2689 Other abnormalities of gait and mobility: Secondary | ICD-10-CM | POA: Insufficient documentation

## 2023-11-25 DIAGNOSIS — R2681 Unsteadiness on feet: Secondary | ICD-10-CM | POA: Diagnosis not present

## 2023-11-25 DIAGNOSIS — M6281 Muscle weakness (generalized): Secondary | ICD-10-CM | POA: Diagnosis not present

## 2023-11-25 DIAGNOSIS — R262 Difficulty in walking, not elsewhere classified: Secondary | ICD-10-CM | POA: Insufficient documentation

## 2023-11-25 NOTE — Therapy (Signed)
 OUTPATIENT PHYSICAL THERAPY VESTIBULAR TREATMENT NOTE     Patient Name: Samantha Snyder MRN: [id number] DOB:[birth year], 78 y.o., female Today's Date: 11/25/2023  END OF SESSION:  PT End of Session - 11/25/23 0932     Visit Number 4    Number of Visits 13    Date for PT Re-Evaluation 12/16/23    Authorization Type [employer]    Authorization Time Period 11/04/2023-12/16/2023    Authorization - Visit Number 4    Authorization - Number of Visits 13    Progress Note Due on Visit 10    PT Start Time 0930    PT Stop Time 1015    PT Time Calculation (min) 45 min    Equipment Utilized During Treatment Gait belt    Activity Tolerance Patient tolerated treatment well    Behavior During Therapy WFL for tasks assessed/performed              Past Medical History:  Diagnosis Date   Allergy Teenager   minor hay fever   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 per x ray    History of UTI 2014   per pt noted after taking Bactrim due to tooth infection   Hyperlipidemia    Neuromuscular disorder (HCC) 2010   neuropathy both feet    Osteopenia    Osteoporosis 2022   Osteoporosis   Right adrenal mass (HCC)    Biopsied in 2008 and benign   Vaginal prolapse    Status post surgery. Improved urinary incontinence   Past Surgical History:  Procedure Laterality Date   ABDOMINAL HYSTERECTOMY  09-07-2013   ABDOMINAL SACROCOLPOPEXY  09/07/2013   BLADDER SURGERY  09/07/2013   vaginal prolapse   BREAST BIOPSY Right    2014? near nipple. benign.   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   Aortic atherosclerosis (HCC) 07/08/2017   Diabetic peripheral neuropathy (HCC) 10/27/2014   Osteoporosis 03/30/2014   Essential hypertension 03/08/2014   DM (diabetes mellitus) type II controlled, neurological manifestation (HCC) 02/24/2014   Seasonal allergies 02/24/2014   Hyperlipidemia associated with type 2 diabetes mellitus (HCC) 02/24/2014   Tobacco abuse 02/24/2014    PCP: Almira Jaeger, MD REFERRING PROVIDER: Almira Jaeger, MD  REFERRING DIAG: R26.89 (ICD-10-CM) - Balance disorder Z91.81 (ICD-10-CM) - Personal history of fall E11.42 (ICD-10-CM) - Diabetic peripheral neuropathy  THERAPY DIAG:  Unsteadiness on feet  Muscle weakness (generalized)  Other abnormalities of gait and mobility  Difficulty in walking, not elsewhere classified  ONSET DATE: 10/11/23  Rationale for Evaluation and Treatment: Rehabilitation  SUBJECTIVE:   SUBJECTIVE STATEMENT: Got new inserts in shoes. Feeling better. Note a lightheaded/unsteadiness feeling when  arising from bed or when standing. Pt accompanied by: self  PERTINENT HISTORY: Patient presented to the emergency department on 10/11/2023-she tripped and fell backwards that morning and struck the back of her head- was cleaning out gutters and when moved off ramp nicked corner of it then fell backwards onto concreate on back of head.  No loss of consciousness.  Had posterior scalp pain and mild dizziness. Prior subdural hemorrhage after previous head injury she had a head CT and neck CT and thankfully no acute or traumatic findings were noted with no residual or recurrent subdural hematomas.  She did have some degenerative  disc disease in the cervical spine without fracture. Was told had concussion- did have dizziness  PAIN:  Are you having pain? No  PRECAUTIONS: Fall  RED FLAGS: None   WEIGHT BEARING RESTRICTIONS: No  FALLS: Has patient fallen in last 6 months? Yes. Number of falls 3  LIVING ENVIRONMENT: Lives with: lives with their spouse Lives in: House/apartment Stairs: ramp to enter/exit home Has following equipment at home: Single point cane and Walker - 4 wheeled  PLOF: Independent with basic ADLs, Independent with household mobility without device, and step-in shower w/ grab bars  PATIENT GOALS: improve balance,   OBJECTIVE:   TODAY'S TREATMENT: 11/25/23 Activity Comments  HEP review --heavy cues for direction/sequence  5xSTS  -trial 1 30 sec -trial 2 20 sec BUE support required  M-CTSIB Condition 2: mild x 30 sec  Gait training -trials w/ foot up on right--mild improvement with foot clearance  Sit to stand eccentrics Push-off for sit to stand, no UE support for slow lowering to 22" seat height         HOME EXERCISE PROGRAM: Access Code: [email or web address] URL: [email or web address] Date: 11/11/2023 Prepared by: [facility] - Outpatient  Rehab - [facility]  Exercises - Standing Balance in Corner with Eyes Closed  - 1 x daily - 7 x weekly - 3 sets - 30 sec hold - Corner Balance Feet Apart: Eyes Open With Head Turns  - 1 x daily - 7 x weekly - 3 sets - 3-5 reps - Corner Balance Feet Together: Eyes Closed With Head Turns  - 1 x daily - 7 x weekly - 3 sets - 3-5 reps - Heel Toe Raises with Counter Support  - 1 x daily - 7 x weekly - 2-3 sets - 10 reps - 3 sec hold - Side Stepping with Counter Support  - 1 x daily - 7 x weekly - 2 sets - 10 reps - Single Leg Stance with Support  - 1 x daily - 7 x weekly - 1 sets - 3 reps - 10 sec hold - Standing Tandem Balance with Counter Support  - 1 x daily - 7 x weekly - 1 sets - 3 reps - 30 sec hold - Sit to Stand with Armchair  - 1 x  daily - 7 x weekly - 5 sets - 5 reps  PATIENT EDUCATION: Education details: HEP additions Person educated: Patient Education method: Programmer, multimedia, Demonstration, Verbal cues, and Handouts Education comprehension: verbalized understanding, returned demonstration, and needs further education   --------------------------------------------- Note: Objective measures were completed at Evaluation unless otherwise noted.  DIAGNOSTIC FINDINGS:   COGNITION: Overall cognitive status: Within functional limits for tasks assessed   SENSATION: Notes plantar surface of feet is numb, tingling/burning extend mid-calf  EDEMA:  none  MUSCLE TONE:    DTRs:    POSTURE:  rounded shoulders, forward head, and increased thoracic kyphosis  Cervical ROM:    Grossly limited all planes from postural fault  STRENGTH:   LOWER EXTREMITY MMT:   MMT Right eval Left eval  Hip flexion 3+ 3+  Hip abduction 3- 3  Hip adduction 4 4  Hip internal rotation    Hip external rotation    Knee flexion 4 4  Knee extension 4- 4  Ankle dorsiflexion 3+ 4  Ankle plantarflexion    Ankle inversion    Ankle eversion    (Blank rows = not tested)  BED MOBILITY:  Reports indep  TRANSFERS: Assistive device utilized: None  Sit to stand: Modified independence Stand to sit: Modified independence Chair to chair: Modified independence Floor: NT--reports requires assistance   CURB: CGA  GAIT: Gait pattern: decreased stride length and poor foot clearance- Right Distance walked:  Assistive device utilized: Single point cane Level of assistance: SBA Comments:   FUNCTIONAL TESTS:  5 times sit to stand: 26 sec w/ UE support Berg Balance Scale: 39/56 10 meter walk test: 26 sec = 1.2 ft/sec M-CTSIB  Condition 1: Firm Surface, EO 30 Sec, Mild Sway  Condition 2: Firm Surface, EC 30 Sec, Moderate Sway  Condition 3: Foam Surface, EO 30 Sec, Moderate Sway  Condition 4: Foam Surface, EC 3 Sec, Severe Sway          VESTIBULAR ASSESSMENT:  GENERAL OBSERVATION: not conducted as pt denies any symptoms consistent with concussion  OTHOSTATICS: not done                                                                                                                              GOALS: Goals reviewed with patient? Yes  SHORT TERM GOALS: Target date: 11/25/2023    Patient will be independent in HEP to improve functional outcomes Baseline: cues for sequence/performance Goal status: IN PROGRESS (11/25/23)  2.  Demo improved BLE strength and reduce risk for falls as evident by 5xSTS test in 20 sec w/out UE  Baseline: 26 sec w/ UE support; 20 sec w/ BUE support Goal status: IN PROGRESS (11/25/23)  3.  Demo improved postural stability per mild sway x 30 sec condition 2 M-CTSIB to improve safety with ADL Baseline: moderate sway x 30; mild x 30 Goal status: MET(11/25/23)    LONG TERM GOALS: Target date: 12/16/2023    Demo low risk for falls per score 47/56 Berg Balance Test Baseline: 39/56 Goal status: IN PROGRESS  2.  Demo improved safety and efficiency with ambulation with adequate right foot clearance and sustaining speed of 2.8 ft/sec Baseline: 1.2 ft/sec w/ cane Goal status: IN PROGRESS  3.  Demo improved postural stability per mild sway x 30 sec condition 4 M-CTSIB to improve safety with ADL Baseline: severe x 3 sec Goal status: IN PROGRESS  4.  Improve right hip abduction strength to 4/5 for improved stability/single limb support Baseline: 3-/5 Goal status: IN PROGRESS    ASSESSMENT:  CLINICAL IMPRESSION: STG review requiring supervision  for HEP for cues/sequence/execution. Improved standing balance under condtion 2 M-CTSIB. Gait training trials w/ foot up for improved right foot clearance with minimal effect for height/step length. Trials of stand to sit eccentrics with instruction to use lift chair to find 22" seat height. Continued sessions to progress POC details to improve  mobility and reduce risk for falls  EVAL:  Patient is a 78 y.o. lady who was seen today for physical therapy evaluation and treatment for balance and mobility deficits.  Has hx of peripheral neuropathy and recent hx of falling and striking head.  At present, it does not seem she is experiencing any lingering deficits related to concussion/head trauma.  Her chief complaint is balance deficits and LE weakness which is affecting her mobility and increasing risk for falls.  Outcome measures reveal high risk for falls and gait/posture deviations present with poor right foot clearance in swing phase and decreased gait speed/stride length requiring increased double limb support.  Poor proprioception and postural stability evident by assessment.  Pt would benefit from PT sessions to address deficits/limitations to improve safety with functional mobility and reduce risk for falls.    OBJECTIVE IMPAIRMENTS: Abnormal gait, decreased activity tolerance, decreased balance, decreased mobility, difficulty walking, decreased strength, and postural dysfunction.   ACTIVITY LIMITATIONS: carrying, lifting, bending, squatting, stairs, transfers, reach over head, and locomotion level  PARTICIPATION LIMITATIONS: meal prep, cleaning, laundry, driving, shopping, community activity, and yard work  PERSONAL FACTORS: Age, Time since onset of injury/illness/exacerbation, and 3+ comorbidities: PMH including DM, neuropathy, hx of falls/SDH are also affecting patient's functional outcome.   REHAB POTENTIAL: Good  CLINICAL DECISION MAKING: Evolving/moderate complexity  EVALUATION COMPLEXITY: Moderate   PLAN:  PT FREQUENCY: 1-2x/week  PT DURATION: 6 weeks  PLANNED INTERVENTIONS: 97750- Physical Performance Testing, 97110-Therapeutic exercises, 97530- Therapeutic activity, [id number]- Neuromuscular re-education, 97535- Self Care, 21308- Manual therapy, [id number]- Gait training, [id number]- Orthotic Initial, [id number]- Orthotic/Prosthetic  subsequent, [phone]- Canalith repositioning, [id number]- Aquatic Therapy, and O9629- Electrical stimulation (unattended)  PLAN FOR NEXT SESSION: strength training additions for HEP  9:32 AM, 11/25/23 M. Kelly Jaray Boliver, PT, DPT Physical Therapist- [place] Office Number: [phone]

## 2023-11-28 ENCOUNTER — Ambulatory Visit

## 2023-11-28 DIAGNOSIS — M6281 Muscle weakness (generalized): Secondary | ICD-10-CM

## 2023-11-28 DIAGNOSIS — R2689 Other abnormalities of gait and mobility: Secondary | ICD-10-CM

## 2023-11-28 DIAGNOSIS — R2681 Unsteadiness on feet: Secondary | ICD-10-CM | POA: Diagnosis not present

## 2023-11-28 DIAGNOSIS — R262 Difficulty in walking, not elsewhere classified: Secondary | ICD-10-CM

## 2023-11-28 NOTE — Therapy (Signed)
 OUTPATIENT PHYSICAL THERAPY VESTIBULAR TREATMENT NOTE     Patient Name: Samantha Snyder MRN: [id number] DOB:[birth year], 78 y.o., female Today's Date: 11/28/2023  END OF SESSION:  PT End of Session - 11/28/23 0924     Visit Number 5    Number of Visits 13    Date for PT Re-Evaluation 12/16/23    Authorization Type [employer]    Authorization Time Period 11/04/2023-12/16/2023    Authorization - Visit Number 5    Authorization - Number of Visits 13    Progress Note Due on Visit 10    PT Start Time 0930    PT Stop Time 1015    PT Time Calculation (min) 45 min    Equipment Utilized During Treatment Gait belt    Activity Tolerance Patient tolerated treatment well    Behavior During Therapy WFL for tasks assessed/performed              Past Medical History:  Diagnosis Date   Allergy Teenager   minor hay fever   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 per x ray    History of UTI 2014   per pt noted after taking Bactrim due to tooth infection   Hyperlipidemia    Neuromuscular disorder (HCC) 2010   neuropathy both feet    Osteopenia    Osteoporosis 2022   Osteoporosis   Right adrenal mass (HCC)    Biopsied in 2008 and benign   Vaginal prolapse    Status post surgery. Improved urinary incontinence   Past Surgical History:  Procedure Laterality Date   ABDOMINAL HYSTERECTOMY  09-07-2013   ABDOMINAL SACROCOLPOPEXY  09/07/2013   BLADDER SURGERY  09/07/2013   vaginal prolapse   BREAST BIOPSY Right    2014? near nipple. benign.   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   Aortic atherosclerosis (HCC) 07/08/2017   Diabetic peripheral neuropathy (HCC) 10/27/2014   Osteoporosis 03/30/2014   Essential hypertension 03/08/2014   DM (diabetes mellitus) type II controlled, neurological manifestation (HCC) 02/24/2014   Seasonal allergies 02/24/2014   Hyperlipidemia associated with type 2 diabetes mellitus (HCC) 02/24/2014   Tobacco abuse 02/24/2014    PCP: Almira Jaeger, MD REFERRING PROVIDER: Almira Jaeger, MD  REFERRING DIAG: R26.89 (ICD-10-CM) - Balance disorder Z91.81 (ICD-10-CM) - Personal history of fall E11.42 (ICD-10-CM) - Diabetic peripheral neuropathy  THERAPY DIAG:  Unsteadiness on feet  Muscle weakness (generalized)  Other abnormalities of gait and mobility  Difficulty in walking, not elsewhere classified  ONSET DATE: 10/11/23  Rationale for Evaluation and Treatment: Rehabilitation  SUBJECTIVE:   SUBJECTIVE STATEMENT: Unable to use the exercise bike at home, unsafe. Did a lot  of walking yesterday.  Pt accompanied by: self  PERTINENT HISTORY: Patient presented to the emergency department on 10/11/2023-she tripped and fell backwards that morning and struck the back of her head- was cleaning out gutters and when moved off ramp nicked corner of it then fell backwards onto concreate on back of head.  No loss of consciousness.  Had posterior scalp pain and mild dizziness. Prior subdural hemorrhage after previous head injury she had a head CT and neck CT and thankfully no acute or traumatic findings were noted with no residual or recurrent subdural hematomas.  She did have some degenerative disc disease in the cervical spine without  fracture. Was told had concussion- did have dizziness  PAIN:  Are you having pain? No  PRECAUTIONS: Fall  RED FLAGS: None   WEIGHT BEARING RESTRICTIONS: No  FALLS: Has patient fallen in last 6 months? Yes. Number of falls 3  LIVING ENVIRONMENT: Lives with: lives with their spouse Lives in: House/apartment Stairs: ramp to enter/exit home Has following equipment at home: Single point cane and Walker - 4 wheeled  PLOF: Independent with basic ADLs, Independent with household mobility without device, and step-in shower w/ grab bars  PATIENT GOALS: improve balance,   OBJECTIVE:   TODAY'S TREATMENT: 11/28/23 Activity Comments  Seated LE AROM warm-up 30x   Stand to sit eccentrics 3x5 Difficulty with control after 3-4th rep  Gross motor/balance -red med ball throwing/toss  -standing on foam: med ball twists 10x, overhead lift 2x5. EO/EC x 30 sec. Weight shift 2x60 sec  Sidestepping x 2 min Forwards/backwards x 2 min BUE support // bars  Stair lift off 1x10 BUE, single, no UE support        TODAY'S TREATMENT: 11/25/23 Activity Comments  HEP review --heavy cues for direction/sequence  5xSTS  -trial 1 30 sec -trial 2 20 sec BUE support required  M-CTSIB Condition 2: mild x 30 sec  Gait training -trials w/ foot up on right--mild improvement with foot clearance  Sit to stand eccentrics Push-off for sit to stand, no UE support for slow lowering to 22" seat height         HOME EXERCISE PROGRAM: Access Code: [email or web address] URL: [email or web address] Date: 11/11/2023 Prepared by: [facility] - Outpatient  Rehab - [facility]  Exercises - Standing Balance in Corner with Eyes Closed  - 1 x daily - 7 x weekly - 3 sets - 30 sec hold - Corner Balance Feet Apart: Eyes Open With Head Turns  - 1 x daily - 7 x weekly - 3 sets - 3-5 reps - Corner Balance Feet Together: Eyes Closed With Head Turns  - 1 x daily - 7 x weekly - 3 sets - 3-5 reps - Heel Toe Raises with Counter  Support  - 1 x daily - 7 x weekly - 2-3 sets - 10 reps - 3 sec hold - Side Stepping with Counter Support  - 1 x daily - 7 x weekly - 2 sets - 10 reps - Single Leg Stance with Support  - 1 x daily - 7 x weekly - 1 sets - 3 reps - 10 sec hold - Standing Tandem Balance with Counter Support  - 1 x daily - 7 x weekly - 1 sets - 3 reps - 30 sec hold - Sit to Stand with Armchair  - 1 x daily - 7 x weekly - 5 sets - 5 reps  PATIENT EDUCATION: Education details: HEP additions Person educated: Patient Education method: Programmer, multimedia, Facilities manager, Verbal  cues, and Handouts Education comprehension: verbalized understanding, returned demonstration, and needs further education   --------------------------------------------- Note: Objective measures were completed at Evaluation unless otherwise noted.  DIAGNOSTIC FINDINGS:   COGNITION: Overall cognitive status: Within functional limits for tasks assessed   SENSATION: Notes plantar surface of feet is numb, tingling/burning extend mid-calf  EDEMA:  none  MUSCLE TONE:    DTRs:    POSTURE:  rounded shoulders, forward head, and increased thoracic kyphosis  Cervical ROM:    Grossly limited all planes from postural fault  STRENGTH:   LOWER EXTREMITY MMT:   MMT Right eval Left eval  Hip flexion 3+ 3+  Hip abduction 3- 3  Hip adduction 4 4  Hip internal rotation    Hip external rotation    Knee flexion 4 4  Knee extension 4- 4  Ankle dorsiflexion 3+ 4  Ankle plantarflexion    Ankle inversion    Ankle eversion    (Blank rows = not tested)  BED MOBILITY:  Reports indep  TRANSFERS: Assistive device utilized: None  Sit to stand: Modified independence Stand to sit: Modified independence Chair to chair: Modified independence Floor: NT--reports requires assistance   CURB: CGA  GAIT: Gait pattern: decreased stride length and poor foot clearance- Right Distance walked:  Assistive device utilized: Single point cane Level of  assistance: SBA Comments:   FUNCTIONAL TESTS:  5 times sit to stand: 26 sec w/ UE support Berg Balance Scale: 39/56 10 meter walk test: 26 sec = 1.2 ft/sec M-CTSIB  Condition 1: Firm Surface, EO 30 Sec, Mild Sway  Condition 2: Firm Surface, EC 30 Sec, Moderate Sway  Condition 3: Foam Surface, EO 30 Sec, Moderate Sway  Condition 4: Foam Surface, EC 3 Sec, Severe Sway         VESTIBULAR ASSESSMENT:  GENERAL OBSERVATION: not conducted as pt denies any symptoms consistent with concussion  OTHOSTATICS: not done                                                                                                                              GOALS: Goals reviewed with patient? Yes  SHORT TERM GOALS: Target date: 11/25/2023    Patient will be independent in HEP to improve functional outcomes Baseline: cues for sequence/performance Goal status: IN PROGRESS (11/25/23)  2.  Demo improved BLE strength and reduce risk for falls as evident by 5xSTS test in 20 sec w/out UE  Baseline: 26 sec w/ UE support; 20 sec w/ BUE support Goal status: IN PROGRESS (11/25/23)  3.  Demo improved postural stability per mild sway x 30 sec condition 2 M-CTSIB to improve safety with ADL Baseline: moderate sway x 30; mild x 30 Goal status: MET(11/25/23)    LONG TERM GOALS: Target date: 12/16/2023    Demo low risk for falls per score 47/56 Berg Balance Test Baseline: 39/56 Goal status: IN PROGRESS  2.  Demo improved safety and efficiency with ambulation with adequate right  foot clearance and sustaining speed of 2.8 ft/sec Baseline: 1.2 ft/sec w/ cane Goal status: IN PROGRESS  3.  Demo improved postural stability per mild sway x 30 sec condition 4 M-CTSIB to improve safety with ADL Baseline: severe x 3 sec Goal status: IN PROGRESS  4.  Improve right hip abduction strength to 4/5 for improved stability/single limb support Baseline: 3-/5 Goal status: IN PROGRESS    ASSESSMENT:  CLINICAL  IMPRESSION: Mobility training to improve BLE strength/control for transfers with techniques to improve eccentric control with improving carryover with cues for coordinating breathing w/ lowering to chair. Balance and coordination activities to improve weight shifting and change of direction in response to perturbations and stepping control.  Static and dynamic balance activities to improve single limb support and facilitate anticipatory and reactive balance strategies demo Fair to Fair- ability w/ single UE support often needed to correct. Continued sessions to progress POC details to improve ambulation and reduce risk for falls  EVAL:  Patient is a 78 y.o. lady who was seen today for physical therapy evaluation and treatment for balance and mobility deficits.  Has hx of peripheral neuropathy and recent hx of falling and striking head.  At present, it does not seem she is experiencing any lingering deficits related to concussion/head trauma.  Her chief complaint is balance deficits and LE weakness which is affecting her mobility and increasing risk for falls.  Outcome measures reveal high risk for falls and gait/posture deviations present with poor right foot clearance in swing phase and decreased gait speed/stride length requiring increased double limb support.  Poor proprioception and postural stability evident by assessment.  Pt would benefit from PT sessions to address deficits/limitations to improve safety with functional mobility and reduce risk for falls.    OBJECTIVE IMPAIRMENTS: Abnormal gait, decreased activity tolerance, decreased balance, decreased mobility, difficulty walking, decreased strength, and postural dysfunction.   ACTIVITY LIMITATIONS: carrying, lifting, bending, squatting, stairs, transfers, reach over head, and locomotion level  PARTICIPATION LIMITATIONS: meal prep, cleaning, laundry, driving, shopping, community activity, and yard work  PERSONAL FACTORS: Age, Time since onset of  injury/illness/exacerbation, and 3+ comorbidities: PMH including DM, neuropathy, hx of falls/SDH are also affecting patient's functional outcome.   REHAB POTENTIAL: Good  CLINICAL DECISION MAKING: Evolving/moderate complexity  EVALUATION COMPLEXITY: Moderate   PLAN:  PT FREQUENCY: 1-2x/week  PT DURATION: 6 weeks  PLANNED INTERVENTIONS: 97750- Physical Performance Testing, 97110-Therapeutic exercises, 97530- Therapeutic activity, [id number]- Neuromuscular re-education, 97535- Self Care, 16109- Manual therapy, [id number]- Gait training, [id number]- Orthotic Initial, [id number]- Orthotic/Prosthetic subsequent, [phone]- Canalith repositioning, [id number]- Aquatic Therapy, and U9811- Electrical stimulation (unattended)  PLAN FOR NEXT SESSION: strength training additions for HEP  9:24 AM, 11/28/23 M. Kelly Khaliyah Northrop, PT, DPT Physical Therapist- [place] Office Number: [phone]

## 2023-12-02 NOTE — Therapy (Signed)
 OUTPATIENT PHYSICAL THERAPY VESTIBULAR TREATMENT NOTE     Patient Name: Samantha Snyder MRN: [id number] DOB:[birth year], 79 y.o., female Today's Date: 12/03/2023  END OF SESSION:  PT End of Session - 12/03/23 0952     Visit Number 6    Number of Visits 13    Date for PT Re-Evaluation 12/16/23    Authorization Type [employer]    Authorization Time Period 11/04/2023-12/16/2023    Authorization - Visit Number 6    Authorization - Number of Visits 13    Progress Note Due on Visit 10    PT Start Time 0932    PT Stop Time 1013    PT Time Calculation (min) 41 min    Equipment Utilized During Treatment Gait belt    Activity Tolerance Patient tolerated treatment well    Behavior During Therapy WFL for tasks assessed/performed               Past Medical History:  Diagnosis Date   Allergy Teenager   minor hay fever   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 per x ray    History of UTI 2014   per pt noted after taking Bactrim due to tooth infection   Hyperlipidemia    Neuromuscular disorder (HCC) 2010   neuropathy both feet    Osteopenia    Osteoporosis 2022   Osteoporosis   Right adrenal mass (HCC)    Biopsied in 2008 and benign   Vaginal prolapse    Status post surgery. Improved urinary incontinence   Past Surgical History:  Procedure Laterality Date   ABDOMINAL HYSTERECTOMY  09-07-2013   ABDOMINAL SACROCOLPOPEXY  09/07/2013   BLADDER SURGERY  09/07/2013   vaginal prolapse   BREAST BIOPSY Right    2014? near nipple. benign.   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   Aortic atherosclerosis (HCC) 07/08/2017   Diabetic peripheral neuropathy (HCC) 10/27/2014   Osteoporosis 03/30/2014   Essential hypertension 03/08/2014   DM (diabetes mellitus) type II controlled, neurological manifestation (HCC) 02/24/2014   Seasonal allergies 02/24/2014   Hyperlipidemia associated with type 2 diabetes mellitus (HCC) 02/24/2014   Tobacco abuse 02/24/2014    PCP: Almira Jaeger, MD REFERRING PROVIDER: Almira Jaeger, MD  REFERRING DIAG: R26.89 (ICD-10-CM) - Balance disorder Z91.81 (ICD-10-CM) - Personal history of fall E11.42 (ICD-10-CM) - Diabetic peripheral neuropathy  THERAPY DIAG:  Unsteadiness on feet  Muscle weakness (generalized)  Other abnormalities of gait and mobility  Difficulty in walking, not elsewhere classified  ONSET DATE: 10/11/23  Rationale for Evaluation and Treatment: Rehabilitation  SUBJECTIVE:   SUBJECTIVE STATEMENT: Might have to miss a few appointments before a dental procedure.  Pt accompanied by: self  PERTINENT HISTORY: Patient presented to the emergency department on 10/11/2023-she tripped and fell backwards that morning and struck the back of her head- was cleaning out gutters and when moved off ramp nicked corner of it then fell backwards onto concreate on back of head.  No loss of consciousness.  Had posterior scalp pain and mild dizziness. Prior subdural hemorrhage after previous head injury she had a head CT and neck CT and thankfully no acute or traumatic findings were noted with no residual or recurrent subdural hematomas.  She did have some degenerative disc disease in the cervical spine without  fracture. Was told had concussion- did have dizziness  PAIN:  Are you having pain? No  PRECAUTIONS: Fall  RED FLAGS: None   WEIGHT BEARING RESTRICTIONS: No  FALLS: Has patient fallen in last 6 months? Yes. Number of falls 3  LIVING ENVIRONMENT: Lives with: lives with their spouse Lives in: House/apartment Stairs: ramp to enter/exit home Has following equipment at home: Single point cane and Walker - 4 wheeled  PLOF: Independent with basic ADLs, Independent with household mobility without device, and step-in shower w/ grab bars  PATIENT GOALS: improve balance,   OBJECTIVE:     TODAY'S TREATMENT: 12/03/23 Activity Comments  Nustep L2x6 min UEs/LEs  Slow; maintains ~38SPM   STS 2x5 Sitting on airex and pushing off knees. Unable to perform without airex   mini squat  2x10 Cueing for hip hinge   standing hip ABD 2# 2x10 Lateral trunk lean, limited control   standing hip EX 2# 2x10 Cues to maintain upright posture   standing HS curl 2# 2x10  Limited eccentric control     PATIENT EDUCATION: Education details: discussed chair yoga; HEP update and encouraged getting 2# ankle weights  Person educated: Patient Education method: Explanation, Demonstration, Tactile cues, Verbal cues, and Handouts Education comprehension: verbalized understanding and returned demonstration   HOME EXERCISE PROGRAM: Access Code: [email or web address] URL: [email or web address] Date: 12/03/2023 Prepared by: [facility] - Outpatient  Rehab - [facility]  Exercises - Standing Balance in Corner with Eyes Closed  - 1 x daily - 7 x weekly - 3 sets - 30 sec hold - Corner Balance Feet Apart: Eyes Open With Head Turns  - 1 x daily - 7 x weekly - 3 sets - 3-5 reps - Corner Balance Feet Together: Eyes Closed With Head Turns  - 1 x daily - 7 x weekly - 3 sets - 3-5 reps - Heel Toe Raises with Counter Support  - 1 x daily - 7 x weekly - 2-3 sets - 10 reps - 3 sec hold - Side Stepping with Counter  Support  - 1 x daily - 7 x weekly - 2 sets - 10 reps - Single Leg Stance with Support  - 1 x daily - 7 x weekly - 1 sets - 3 reps - 10 sec hold - Standing Tandem Balance with Counter Support  - 1 x daily - 7 x weekly - 1 sets - 3 reps - 30 sec hold - Sit to Stand with Armchair  - 1 x daily - 7 x weekly - 5 sets - 5 reps - Mini Squat with Counter Support  - 1 x daily - 5 x weekly - 2 sets - 10 reps - Standing Hip Abduction with Counter Support  - 1 x daily - 5 x weekly - 2 sets - 10 reps - Standing Hip Extension with Counter Support  - 1 x daily -  5 x weekly - 2 sets - 10 reps   --------------------------------------------- Note: Objective measures were completed at Evaluation unless otherwise noted.  DIAGNOSTIC FINDINGS:   COGNITION: Overall cognitive status: Within functional limits for tasks assessed   SENSATION: Notes plantar surface of feet is numb, tingling/burning extend mid-calf  EDEMA:  none  MUSCLE TONE:    DTRs:    POSTURE:  rounded shoulders, forward head, and increased thoracic kyphosis  Cervical ROM:    Grossly limited all planes from postural fault  STRENGTH:   LOWER EXTREMITY MMT:   MMT Right eval Left eval  Hip flexion 3+ 3+  Hip abduction 3- 3  Hip adduction 4 4  Hip internal rotation    Hip external rotation    Knee flexion 4 4  Knee extension 4- 4  Ankle dorsiflexion 3+ 4  Ankle plantarflexion    Ankle inversion    Ankle eversion    (Blank rows = not tested)  BED MOBILITY:  Reports indep  TRANSFERS: Assistive device utilized: None  Sit to stand: Modified independence Stand to sit: Modified independence Chair to chair: Modified independence Floor: NT--reports requires assistance   CURB: CGA  GAIT: Gait pattern: decreased stride length and poor foot clearance- Right Distance walked:  Assistive device utilized: Single point cane Level of assistance: SBA Comments:   FUNCTIONAL TESTS:  5 times sit to stand: 26 sec w/ UE  support Berg Balance Scale: 39/56 10 meter walk test: 26 sec = 1.2 ft/sec M-CTSIB  Condition 1: Firm Surface, EO 30 Sec, Mild Sway  Condition 2: Firm Surface, EC 30 Sec, Moderate Sway  Condition 3: Foam Surface, EO 30 Sec, Moderate Sway  Condition 4: Foam Surface, EC 3 Sec, Severe Sway         VESTIBULAR ASSESSMENT:  GENERAL OBSERVATION: not conducted as pt denies any symptoms consistent with concussion  OTHOSTATICS: not done                                                                                                                              GOALS: Goals reviewed with patient? Yes  SHORT TERM GOALS: Target date: 11/25/2023    Patient will be independent in HEP to improve functional outcomes Baseline: cues for sequence/performance Goal status: IN PROGRESS (11/25/23)  2.  Demo improved BLE strength and reduce risk for falls as evident by 5xSTS test in 20 sec w/out UE  Baseline: 26 sec w/ UE support; 20 sec w/ BUE support Goal status: IN PROGRESS (11/25/23)  3.  Demo improved postural stability per mild sway x 30 sec condition 2 M-CTSIB to improve safety with ADL Baseline: moderate sway x 30; mild x 30 Goal status: MET(11/25/23)    LONG TERM GOALS: Target date: 12/16/2023    Demo low risk for falls per score 47/56 Berg Balance Test Baseline: 39/56 Goal status: IN PROGRESS  2.  Demo improved safety and efficiency with ambulation with adequate right foot clearance and sustaining  speed of 2.8 ft/sec Baseline: 1.2 ft/sec w/ cane Goal status: IN PROGRESS  3.  Demo improved postural stability per mild sway x 30 sec condition 4 M-CTSIB to improve safety with ADL Baseline: severe x 3 sec Goal status: IN PROGRESS  4.  Improve right hip abduction strength to 4/5 for improved stability/single limb support Baseline: 3-/5 Goal status: IN PROGRESS    ASSESSMENT:  CLINICAL IMPRESSION: Patient arrived to session without new complaints. Session focused on progressive LE  strengthening and endurance activities. Patient able to perform transfers with reduced UE use from slightly elevated seat height. Standing weighted strengthening required cueing for form and maintaining proper posture. HEP was updated with strengthening activities that were well-tolerated today. Patient reported understanding and without complaints upon leaving. Declined escort to her car for safety.   EVAL:  Patient is a 78 y.o. lady who was seen today for physical therapy evaluation and treatment for balance and mobility deficits.  Has hx of peripheral neuropathy and recent hx of falling and striking head.  At present, it does not seem she is experiencing any lingering deficits related to concussion/head trauma.  Her chief complaint is balance deficits and LE weakness which is affecting her mobility and increasing risk for falls.  Outcome measures reveal high risk for falls and gait/posture deviations present with poor right foot clearance in swing phase and decreased gait speed/stride length requiring increased double limb support.  Poor proprioception and postural stability evident by assessment.  Pt would benefit from PT sessions to address deficits/limitations to improve safety with functional mobility and reduce risk for falls.    OBJECTIVE IMPAIRMENTS: Abnormal gait, decreased activity tolerance, decreased balance, decreased mobility, difficulty walking, decreased strength, and postural dysfunction.   ACTIVITY LIMITATIONS: carrying, lifting, bending, squatting, stairs, transfers, reach over head, and locomotion level  PARTICIPATION LIMITATIONS: meal prep, cleaning, laundry, driving, shopping, community activity, and yard work  PERSONAL FACTORS: Age, Time since onset of injury/illness/exacerbation, and 3+ comorbidities: PMH including DM, neuropathy, hx of falls/SDH are also affecting patient's functional outcome.   REHAB POTENTIAL: Good  CLINICAL DECISION MAKING: Evolving/moderate  complexity  EVALUATION COMPLEXITY: Moderate   PLAN:  PT FREQUENCY: 1-2x/week  PT DURATION: 6 weeks  PLANNED INTERVENTIONS: 97750- Physical Performance Testing, 97110-Therapeutic exercises, 97530- Therapeutic activity, [id number]- Neuromuscular re-education, 97535- Self Care, 16109- Manual therapy, [phone]- Gait training, [phone]- Orthotic Initial, [phone]- Orthotic/Prosthetic subsequent, [phone]- Canalith repositioning, [phone]- Aquatic Therapy, and [phone]- Electrical stimulation (unattended)  PLAN FOR NEXT SESSION: strength training additions for HEP   Thaddeus Filippo, PT, DPT 12/03/23 10:15 AM  [facility] at [facility] [address] [place], [place] [address] Phone # [phone] Fax # [phone]

## 2023-12-03 ENCOUNTER — Encounter: Payer: Self-pay | Admitting: Physical Therapy

## 2023-12-03 ENCOUNTER — Ambulatory Visit: Admitting: Physical Therapy

## 2023-12-03 DIAGNOSIS — M6281 Muscle weakness (generalized): Secondary | ICD-10-CM | POA: Diagnosis not present

## 2023-12-03 DIAGNOSIS — R2689 Other abnormalities of gait and mobility: Secondary | ICD-10-CM

## 2023-12-03 DIAGNOSIS — R2681 Unsteadiness on feet: Secondary | ICD-10-CM | POA: Diagnosis not present

## 2023-12-03 DIAGNOSIS — R262 Difficulty in walking, not elsewhere classified: Secondary | ICD-10-CM

## 2023-12-04 ENCOUNTER — Other Ambulatory Visit: Payer: Self-pay | Admitting: Acute Care

## 2023-12-04 DIAGNOSIS — Z87891 Personal history of nicotine dependence: Secondary | ICD-10-CM

## 2023-12-04 DIAGNOSIS — F17210 Nicotine dependence, cigarettes, uncomplicated: Secondary | ICD-10-CM

## 2023-12-04 DIAGNOSIS — Z122 Encounter for screening for malignant neoplasm of respiratory organs: Secondary | ICD-10-CM

## 2023-12-04 NOTE — Therapy (Signed)
 OUTPATIENT PHYSICAL THERAPY VESTIBULAR TREATMENT NOTE     Patient Name: Samantha Snyder MRN: [id number] DOB:1946-06-19, 78 y.o., female Today's Date: 12/05/2023  END OF SESSION:  PT End of Session - 12/05/23 1010     Visit Number 7    Number of Visits 13    Date for PT Re-Evaluation 12/16/23    Authorization Type [employer]    Authorization Time Period 11/04/2023-12/16/2023    Authorization - Visit Number 7    Authorization - Number of Visits 13    Progress Note Due on Visit 10    PT Start Time 0931    PT Stop Time 1015    PT Time Calculation (min) 44 min    Equipment Utilized During Treatment Gait belt    Activity Tolerance Patient tolerated treatment well    Behavior During Therapy WFL for tasks assessed/performed             Past Medical History:  Diagnosis Date   Allergy Teenager   minor hay fever   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 per x ray    History of UTI 2014   per pt noted after taking Bactrim due to tooth infection   Hyperlipidemia    Neuromuscular disorder (HCC) 2010   neuropathy both feet    Osteopenia    Osteoporosis 2022   Osteoporosis   Right adrenal mass (HCC)    Biopsied in 2008 and benign   Vaginal prolapse    Status post surgery. Improved urinary incontinence   Past Surgical History:  Procedure Laterality Date   ABDOMINAL HYSTERECTOMY  09-07-2013   ABDOMINAL SACROCOLPOPEXY  09/07/2013   BLADDER SURGERY  09/07/2013   vaginal prolapse   BREAST BIOPSY Right    2014? near nipple. benign.   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   Aortic atherosclerosis (HCC) 07/08/2017   Diabetic peripheral neuropathy (HCC) 10/27/2014   Osteoporosis 03/30/2014   Essential hypertension 03/08/2014   DM (diabetes mellitus) type II controlled, neurological manifestation (HCC) 02/24/2014   Seasonal allergies 02/24/2014   Hyperlipidemia associated with type 2 diabetes mellitus (HCC) 02/24/2014   Tobacco abuse 02/24/2014    PCP: Almira Jaeger, MD REFERRING PROVIDER: Almira Jaeger, MD  REFERRING DIAG: R26.89 (ICD-10-CM) - Balance disorder Z91.81 (ICD-10-CM) - Personal history of fall E11.42 (ICD-10-CM) - Diabetic peripheral neuropathy  THERAPY DIAG:  Unsteadiness on feet  Muscle weakness (generalized)  Other abnormalities of gait and mobility  Difficulty in walking, not elsewhere classified  ONSET DATE: 10/11/23  Rationale for Evaluation and Treatment: Rehabilitation  SUBJECTIVE:   SUBJECTIVE STATEMENT: Went out and got me some ankle weights. Cx'd appointments next week for  dental procedure.   Pt accompanied by: self  PERTINENT HISTORY: Patient presented to the emergency department on 10/11/2023-she tripped and fell backwards that morning and struck the back of her head- was cleaning out gutters and when moved off ramp nicked corner of it then fell backwards onto concreate on back of head.  No loss of consciousness.  Had posterior scalp pain and mild dizziness. Prior subdural hemorrhage after previous head injury she had a head CT and neck CT and thankfully no acute or traumatic findings were noted with no residual or recurrent subdural hematomas.  She did have some degenerative disc disease in the cervical  spine without fracture. Was told had concussion- did have dizziness  PAIN:  Are you having pain? No  PRECAUTIONS: Fall  RED FLAGS: None   WEIGHT BEARING RESTRICTIONS: No  FALLS: Has patient fallen in last 6 months? Yes. Number of falls 3  LIVING ENVIRONMENT: Lives with: lives with their spouse Lives in: House/apartment Stairs: ramp to enter/exit home Has following equipment at home: Single point cane and Walker - 4 wheeled  PLOF: Independent with basic ADLs, Independent with household mobility without device, and step-in shower w/ grab bars  PATIENT GOALS: improve balance,   OBJECTIVE:    TODAY'S TREATMENT: 12/05/23 Activity Comments  Nustep L5 x 8 min UEs/LEs Slow; cues to maintain at least 45 SPM  STS with airex under bottom 3x5 Good response to cues to reach down, then up to stand without UE support      Super set in II bars: sidestepping 2# 2x1 min standing HS curl 2# 2x10 each   Cueing to maintain upright posture throughout and maintain muscle control   Super set in II bars: sidestepping over cones #2 2x1 min alt step ups 2# 6 2x1 min Cues to avoid circumduction and flexed posture, alternating pattern with steps        HOME EXERCISE PROGRAM: Access Code: [email or web address] URL: [email or web address] Date: 12/03/2023 Prepared by: [facility] - Outpatient  Rehab - [facility]  Exercises - Standing Balance in Corner with Eyes Closed  - 1 x daily - 7 x weekly - 3 sets - 30 sec hold - Corner Balance Feet Apart: Eyes Open With Head Turns  - 1 x daily - 7 x weekly - 3 sets - 3-5 reps - Corner Balance Feet Together: Eyes Closed With Head Turns  - 1 x daily - 7 x weekly - 3 sets - 3-5 reps - Heel Toe Raises with Counter Support  - 1 x daily - 7 x weekly - 2-3 sets - 10 reps - 3 sec hold - Side Stepping with Counter Support  - 1 x daily - 7 x weekly - 2 sets - 10 reps - Single Leg Stance with Support  - 1 x daily - 7 x weekly - 1 sets - 3 reps - 10 sec  hold - Standing Tandem Balance with Counter Support  - 1 x daily - 7 x weekly - 1 sets - 3 reps - 30 sec hold - Sit to Stand with Armchair  - 1 x daily - 7 x weekly - 5 sets - 5 reps - Mini Squat with Counter Support  - 1 x daily - 5 x weekly - 2 sets - 10 reps - Standing Hip Abduction with Counter Support  - 1 x daily - 5 x weekly - 2 sets - 10 reps - Standing Hip Extension with Counter Support  - 1 x daily - 5 x  weekly - 2 sets - 10 reps   --------------------------------------------- Note: Objective measures were completed at Evaluation unless otherwise noted.  DIAGNOSTIC FINDINGS:   COGNITION: Overall cognitive status: Within functional limits for tasks assessed   SENSATION: Notes plantar surface of feet is numb, tingling/burning extend mid-calf  EDEMA:  none  MUSCLE TONE:    DTRs:    POSTURE:  rounded shoulders, forward head, and increased thoracic kyphosis  Cervical ROM:    Grossly limited all planes from postural fault  STRENGTH:   LOWER EXTREMITY MMT:   MMT Right eval Left eval  Hip flexion 3+ 3+  Hip abduction 3- 3  Hip adduction 4 4  Hip internal rotation    Hip external rotation    Knee flexion 4 4  Knee extension 4- 4  Ankle dorsiflexion 3+ 4  Ankle plantarflexion    Ankle inversion    Ankle eversion    (Blank rows = not tested)  BED MOBILITY:  Reports indep  TRANSFERS: Assistive device utilized: None  Sit to stand: Modified independence Stand to sit: Modified independence Chair to chair: Modified independence Floor: NT--reports requires assistance   CURB: CGA  GAIT: Gait pattern: decreased stride length and poor foot clearance- Right Distance walked:  Assistive device utilized: Single point cane Level of assistance: SBA Comments:   FUNCTIONAL TESTS:  5 times sit to stand: 26 sec w/ UE support Berg Balance Scale: 39/56 10 meter walk test: 26 sec = 1.2 ft/sec M-CTSIB  Condition 1: Firm Surface, EO 30 Sec, Mild Sway  Condition  2: Firm Surface, EC 30 Sec, Moderate Sway  Condition 3: Foam Surface, EO 30 Sec, Moderate Sway  Condition 4: Foam Surface, EC 3 Sec, Severe Sway         VESTIBULAR ASSESSMENT:  GENERAL OBSERVATION: not conducted as pt denies any symptoms consistent with concussion  OTHOSTATICS: not done                                                                                                                              GOALS: Goals reviewed with patient? Yes  SHORT TERM GOALS: Target date: 11/25/2023    Patient will be independent in HEP to improve functional outcomes Baseline: cues for sequence/performance Goal status: IN PROGRESS (11/25/23)  2.  Demo improved BLE strength and reduce risk for falls as evident by 5xSTS test in 20 sec w/out UE  Baseline: 26 sec w/ UE support; 20 sec w/ BUE support Goal status: IN PROGRESS (11/25/23)  3.  Demo improved postural stability per mild sway x 30 sec condition 2 M-CTSIB to improve safety with ADL Baseline: moderate sway x 30; mild x 30 Goal status: MET(11/25/23)    LONG TERM GOALS: Target date: 12/16/2023    Demo low risk for falls per score 47/56 Berg Balance Test Baseline: 39/56 Goal status: IN PROGRESS  2.  Demo improved safety and efficiency with ambulation with adequate right foot clearance and sustaining speed of  2.8 ft/sec Baseline: 1.2 ft/sec w/ cane Goal status: IN PROGRESS  3.  Demo improved postural stability per mild sway x 30 sec condition 4 M-CTSIB to improve safety with ADL Baseline: severe x 3 sec Goal status: IN PROGRESS  4.  Improve right hip abduction strength to 4/5 for improved stability/single limb support Baseline: 3-/5 Goal status: IN PROGRESS    ASSESSMENT:  CLINICAL IMPRESSION: Patient arrived to session without complaints. Continued working on transfers from elevated seat height. Patient was able to perform without UE support today after cueing for technique. Proceeded with weighted strengthening in  supersets to challenge muscle strength and endurance. Patient required cues for max safety and upright posture. Patient tolerated session well and without complaints upon leaving.   EVAL:  Patient is a 78 y.o. lady who was seen today for physical therapy evaluation and treatment for balance and mobility deficits.  Has hx of peripheral neuropathy and recent hx of falling and striking head.  At present, it does not seem she is experiencing any lingering deficits related to concussion/head trauma.  Her chief complaint is balance deficits and LE weakness which is affecting her mobility and increasing risk for falls.  Outcome measures reveal high risk for falls and gait/posture deviations present with poor right foot clearance in swing phase and decreased gait speed/stride length requiring increased double limb support.  Poor proprioception and postural stability evident by assessment.  Pt would benefit from PT sessions to address deficits/limitations to improve safety with functional mobility and reduce risk for falls.    OBJECTIVE IMPAIRMENTS: Abnormal gait, decreased activity tolerance, decreased balance, decreased mobility, difficulty walking, decreased strength, and postural dysfunction.   ACTIVITY LIMITATIONS: carrying, lifting, bending, squatting, stairs, transfers, reach over head, and locomotion level  PARTICIPATION LIMITATIONS: meal prep, cleaning, laundry, driving, shopping, community activity, and yard work  PERSONAL FACTORS: Age, Time since onset of injury/illness/exacerbation, and 3+ comorbidities: PMH including DM, neuropathy, hx of falls/SDH are also affecting patient's functional outcome.   REHAB POTENTIAL: Good  CLINICAL DECISION MAKING: Evolving/moderate complexity  EVALUATION COMPLEXITY: Moderate   PLAN:  PT FREQUENCY: 1-2x/week  PT DURATION: 6 weeks  PLANNED INTERVENTIONS: 97750- Physical Performance Testing, 97110-Therapeutic exercises, 97530- Therapeutic activity, [id number]-  Neuromuscular re-education, 97535- Self Care, 16109- Manual therapy, [phone]- Gait training, [phone]- Orthotic Initial, [phone]- Orthotic/Prosthetic subsequent, [phone]- Canalith repositioning, [phone]- Aquatic Therapy, and [phone]- Electrical stimulation (unattended)  PLAN FOR NEXT SESSION: strength training additions for HEP   Thaddeus Filippo, PT, DPT 12/05/23 10:16 AM  [facility] [address] [place], [place] [address] Phone # [phone] Fax # [phone]

## 2023-12-05 ENCOUNTER — Encounter: Payer: Self-pay | Admitting: Physical Therapy

## 2023-12-05 ENCOUNTER — Ambulatory Visit: Admitting: Physical Therapy

## 2023-12-05 DIAGNOSIS — R2681 Unsteadiness on feet: Secondary | ICD-10-CM | POA: Diagnosis not present

## 2023-12-05 DIAGNOSIS — R262 Difficulty in walking, not elsewhere classified: Secondary | ICD-10-CM

## 2023-12-05 DIAGNOSIS — R2689 Other abnormalities of gait and mobility: Secondary | ICD-10-CM

## 2023-12-05 DIAGNOSIS — M6281 Muscle weakness (generalized): Secondary | ICD-10-CM | POA: Diagnosis not present

## 2023-12-09 ENCOUNTER — Ambulatory Visit

## 2023-12-16 ENCOUNTER — Ambulatory Visit

## 2023-12-21 ENCOUNTER — Other Ambulatory Visit: Payer: Self-pay | Admitting: Family Medicine

## 2023-12-24 ENCOUNTER — Ambulatory Visit (HOSPITAL_BASED_OUTPATIENT_CLINIC_OR_DEPARTMENT_OTHER)
Admission: RE | Admit: 2023-12-24 | Discharge: 2023-12-24 | Disposition: A | Discharge status: Home / Self Care | Source: Ambulatory Visit | Attending: Acute Care | Admitting: Acute Care

## 2023-12-24 ENCOUNTER — Ambulatory Visit: Attending: Family Medicine

## 2023-12-24 DIAGNOSIS — Z87891 Personal history of nicotine dependence: Secondary | ICD-10-CM | POA: Diagnosis not present

## 2023-12-24 DIAGNOSIS — M6281 Muscle weakness (generalized): Secondary | ICD-10-CM | POA: Diagnosis not present

## 2023-12-24 DIAGNOSIS — F17210 Nicotine dependence, cigarettes, uncomplicated: Secondary | ICD-10-CM | POA: Insufficient documentation

## 2023-12-24 DIAGNOSIS — M5459 Other low back pain: Secondary | ICD-10-CM | POA: Diagnosis not present

## 2023-12-24 DIAGNOSIS — R262 Difficulty in walking, not elsewhere classified: Secondary | ICD-10-CM | POA: Diagnosis not present

## 2023-12-24 DIAGNOSIS — Z122 Encounter for screening for malignant neoplasm of respiratory organs: Secondary | ICD-10-CM | POA: Diagnosis not present

## 2023-12-24 DIAGNOSIS — R2681 Unsteadiness on feet: Secondary | ICD-10-CM | POA: Diagnosis not present

## 2023-12-24 DIAGNOSIS — R2689 Other abnormalities of gait and mobility: Secondary | ICD-10-CM | POA: Diagnosis not present

## 2023-12-24 NOTE — Therapy (Signed)
 OUTPATIENT PHYSICAL THERAPY VESTIBULAR TREATMENT NOTE, Progress Note, and Recertification     Patient Name: Samantha Snyder MRN: [id number] DOB:[birth year], 78 y.o., female Today's Date: 12/24/2023 Progress Note Reporting Period 11/04/23 to 12/24/23  See note below for Objective Data and Assessment of Progress/Goals.      END OF SESSION:  PT End of Session - 12/24/23 0936     Visit Number 8    Number of Visits 16    Date for PT Re-Evaluation 02/04/24    Authorization Type [employer]    Authorization Time Period auth sent 12/24/23    Authorization - Visit Number 8    Authorization - Number of Visits 13    Progress Note Due on Visit 18    PT Start Time 0930    PT Stop Time 1015    PT Time Calculation (min) 45 min    Equipment Utilized During Treatment Gait belt    Activity Tolerance Patient tolerated treatment well    Behavior During Therapy WFL for tasks assessed/performed             Past Medical History:  Diagnosis Date   Allergy Teenager   minor hay fever   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 per x ray    History of UTI 2014   per pt noted after taking Bactrim due to tooth infection   Hyperlipidemia    Neuromuscular disorder (HCC) 2010   neuropathy both feet    Osteopenia    Osteoporosis 2022   Osteoporosis   Right adrenal mass (HCC)    Biopsied in 2008 and benign   Vaginal prolapse    Status post surgery. Improved urinary incontinence   Past Surgical History:  Procedure Laterality Date   ABDOMINAL HYSTERECTOMY  09-07-2013   ABDOMINAL SACROCOLPOPEXY  09/07/2013   BLADDER SURGERY  09/07/2013   vaginal prolapse   BREAST BIOPSY Right    2014? near nipple.  benign.   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/2020   Aortic atherosclerosis (HCC) 07/08/2017   Diabetic peripheral neuropathy (HCC) 10/27/2014   Osteoporosis 03/30/2014   Essential hypertension 03/08/2014   DM (diabetes mellitus) type II controlled, neurological manifestation (HCC) 02/24/2014   Seasonal allergies 02/24/2014   Hyperlipidemia associated with type 2 diabetes mellitus (HCC) 02/24/2014   Tobacco abuse 02/24/2014    PCP: Katrinka Garnette KIDD, MD REFERRING PROVIDER: Katrinka Garnette KIDD, MD  REFERRING DIAG: R26.89 (ICD-10-CM) - Balance disorder Z91.81 (ICD-10-CM) - Personal history of fall E11.42 (ICD-10-CM) - Diabetic peripheral neuropathy  THERAPY DIAG:  Unsteadiness on feet  Muscle weakness (generalized)  Other abnormalities of gait and mobility  Difficulty in walking, not elsewhere classified  ONSET  DATE: 10/11/23  Rationale for Evaluation and Treatment: Rehabilitation  SUBJECTIVE:   SUBJECTIVE STATEMENT: Doing ok, was mostly sedentary during recovery from dental procedure  Pt accompanied by: self  PERTINENT HISTORY: Patient presented to the emergency department on 10/11/2023-she tripped and fell backwards that morning and struck the back of her head- was cleaning out gutters and when moved off ramp nicked corner of it then fell backwards onto concreate on back of head.  No loss of consciousness.  Had posterior scalp pain and mild dizziness. Prior subdural hemorrhage after previous head injury she had a head CT and neck CT and thankfully no acute or  traumatic findings were noted with no residual or recurrent subdural hematomas.  She did have some degenerative disc disease in the cervical spine without fracture. Was told had concussion- did have dizziness  PAIN:  Are you having pain? No  PRECAUTIONS: Fall  RED FLAGS: None   WEIGHT BEARING RESTRICTIONS: No  FALLS: Has patient fallen in last 6 months? Yes. Number of falls 3  LIVING ENVIRONMENT: Lives with: lives with their spouse Lives in: House/apartment Stairs: ramp to enter/exit home Has following equipment at home: Single point cane and Walker - 4 wheeled  PLOF: Independent with basic ADLs, Independent with household mobility without device, and step-in shower w/ grab bars  PATIENT GOALS: improve balance,   OBJECTIVE:   TODAY'S TREATMENT: 12/24/23 Activity Comments  5xSTS test 44 sec, no UE support  Berg Balance Test 47/56  10 meter walk = 1.2 ft/sec 26.19 sec w/ and without cane  HEP review W/ 2# weights             [facility] PT Assessment - 12/24/23 0001       Standardized Balance Assessment   Five times sit to stand comments  44 sec, w/out UE      Berg Balance Test   Sit to Stand Able to stand without using hands and stabilize independently    Standing Unsupported Able to stand safely 2 minutes    Sitting with Back Unsupported but Feet Supported on Floor or Stool Able to sit safely and securely 2 minutes    Stand to Sit Sits safely with minimal use of hands    Transfers Able to transfer safely, minor use of hands    Standing Unsupported with Eyes Closed Able to stand 10 seconds safely    Standing Unsupported with Feet Together Able to place feet together independently and stand 1 minute safely    From Standing, Reach Forward with Outstretched Arm Can reach forward >12 cm safely (5)    From Standing Position, Pick up Object from Floor Able to pick up shoe safely and easily    From Standing Position, Turn to Look Behind Over each Shoulder Looks behind from both  sides and weight shifts well    Turn 360 Degrees Able to turn 360 degrees safely but slowly    Standing Unsupported, Alternately Place Feet on Step/Stool Able to stand independently and complete 8 steps >20 seconds    Standing Unsupported, One Foot in Front Able to take small step independently and hold 30 seconds    Standing on One Leg Tries to lift leg/unable to hold 3 seconds but remains standing independently    Total Score 47               HOME EXERCISE PROGRAM: Access Code: [email or web address] URL: [email or web address] Date: 12/03/2023 Prepared by: [facility] - Outpatient  Rehab - [facility]  Exercises -  Standing Balance in Corner with Eyes Closed  - 1 x daily - 7 x weekly - 3 sets - 30 sec hold - Corner Balance Feet Apart: Eyes Open With Head Turns  - 1 x daily - 7 x weekly - 3 sets - 3-5 reps - Corner Balance Feet Together: Eyes Closed With Head Turns  - 1 x daily - 7 x weekly - 3 sets - 3-5 reps - Heel Toe Raises with Counter Support  - 1 x daily - 7 x weekly - 2-3 sets - 10 reps - 3 sec hold - Side Stepping with Counter Support  - 1 x daily - 7 x weekly - 2 sets - 10 reps - Single Leg Stance with Support  - 1 x daily - 7 x weekly - 1 sets - 3 reps - 10 sec hold - Standing Tandem Balance with Counter Support  - 1 x daily - 7 x weekly - 1 sets - 3 reps - 30 sec hold - Sit to Stand with Armchair  - 1 x daily - 7 x weekly - 5 sets - 5 reps - Mini Squat with Counter Support  - 1 x daily - 5 x weekly - 2 sets - 10 reps - Standing Hip Abduction with Counter Support  - 1 x daily - 5 x weekly - 2 sets - 10 reps - Standing Hip Extension with Counter Support  - 1 x daily - 5 x weekly - 2 sets - 10 reps   --------------------------------------------- Note: Objective measures were completed at Evaluation unless otherwise noted.  DIAGNOSTIC FINDINGS:   COGNITION: Overall cognitive status: Within functional limits for tasks assessed   SENSATION: Notes plantar surface  of feet is numb, tingling/burning extend mid-calf  EDEMA:  none  MUSCLE TONE:    DTRs:    POSTURE:  rounded shoulders, forward head, and increased thoracic kyphosis  Cervical ROM:    Grossly limited all planes from postural fault  STRENGTH:   LOWER EXTREMITY MMT:   MMT Right eval Left eval  Hip flexion 3+ 3+  Hip abduction 3- 3  Hip adduction 4 4  Hip internal rotation    Hip external rotation    Knee flexion 4 4  Knee extension 4- 4  Ankle dorsiflexion 3+ 4  Ankle plantarflexion    Ankle inversion    Ankle eversion    (Blank rows = not tested)  BED MOBILITY:  Reports indep  TRANSFERS: Assistive device utilized: None  Sit to stand: Modified independence Stand to sit: Modified independence Chair to chair: Modified independence Floor: NT--reports requires assistance   CURB: CGA  GAIT: Gait pattern: decreased stride length and poor foot clearance- Right Distance walked:  Assistive device utilized: Single point cane Level of assistance: SBA Comments:   FUNCTIONAL TESTS:  5 times sit to stand: 26 sec w/ UE support Berg Balance Scale: 39/56 10 meter walk test: 26 sec = 1.2 ft/sec M-CTSIB  Condition 1: Firm Surface, EO 30 Sec, Mild Sway  Condition 2: Firm Surface, EC 30 Sec, Moderate Sway  Condition 3: Foam Surface, EO 30 Sec, Moderate Sway  Condition 4: Foam Surface, EC 3 Sec, Severe Sway         VESTIBULAR ASSESSMENT:  GENERAL OBSERVATION: not conducted as pt denies any symptoms consistent with concussion  OTHOSTATICS: not done  GOALS: Goals reviewed with patient? Yes  SHORT TERM GOALS: Target date: 01/14/2024      Patient will be independent in HEP to improve functional outcomes Baseline: cues for sequence/performance Goal status: IN PROGRESS (11/25/23)  2.  Demo improved BLE strength and reduce risk for falls  as evident by 5xSTS test in 20 sec w/out UE  Baseline: 26 sec w/ UE support; 20 sec w/ BUE support; 44 sec w/out UE Goal status: IN PROGRESS (12/24/23)  3.  Demo improved postural stability per mild sway x 30 sec condition 2 M-CTSIB to improve safety with ADL Baseline: moderate sway x 30; mild x 30 Goal status: MET(11/25/23)    LONG TERM GOALS: Target date: 02/04/24    Demo low risk for falls per score 47/56 Berg Balance Test Baseline: 39/56; 47/56 Goal status: MET 12/24/23  2.  Demo improved safety and efficiency with ambulation with adequate right foot clearance and sustaining speed of 2.8 ft/sec Baseline: 1.2 ft/sec w/ cane Goal status: IN PROGRESS 12/24/23  3.  Demo improved postural stability per mild sway x 30 sec condition 4 M-CTSIB to improve safety with ADL Baseline: severe x 3 sec; mod-severe x 6 sec Goal status: IN PROGRESS (12/24/23)  4.  Improve right hip abduction strength to 4/5 for improved stability/single limb support Baseline: 3-/5; 3/5  Goal status: IN PROGRESS 12/24/23    ASSESSMENT:  CLINICAL IMPRESSION: Returns to clinic from recent absence due to dental procedure.  Demo improved ability to perform sit to stand w/out UE support but requiring increased time of 44 sec indicative of risk for falls.  Markedly improved performance Berg Balance Test from initial 39 to 47/56 indicating low risk for falls.  Continues to have difficulty with condition 4 M-CTSIB maintaining balance x 6 sec mod-severe sway before LOB.  Slight improvement to right hip abduction strength appreciated via manual muscle testing.  Continues to ambulate with decreased right foot clearance and frequently step-to pattern from reduced stride length.  Pt reports consistency w/ HEP performing resistance exercises every other day.  Pt would benefit from continued sessions to progress POC details to provide ongoing development to HEP to improve BLE strength and balance to reduce risk for falls and gait  dysfunction.  EVAL:  Patient is a 78 y.o. lady who was seen today for physical therapy evaluation and treatment for balance and mobility deficits.  Has hx of peripheral neuropathy and recent hx of falling and striking head.  At present, it does not seem she is experiencing any lingering deficits related to concussion/head trauma.  Her chief complaint is balance deficits and LE weakness which is affecting her mobility and increasing risk for falls.  Outcome measures reveal high risk for falls and gait/posture deviations present with poor right foot clearance in swing phase and decreased gait speed/stride length requiring increased double limb support.  Poor proprioception and postural stability evident by assessment.  Pt would benefit from PT sessions to address deficits/limitations to improve safety with functional mobility and reduce risk for falls.    OBJECTIVE IMPAIRMENTS: Abnormal gait, decreased activity tolerance, decreased balance, decreased mobility, difficulty walking, decreased strength, and postural dysfunction.   ACTIVITY LIMITATIONS: carrying, lifting, bending, squatting, stairs, transfers, reach over head, and locomotion level  PARTICIPATION LIMITATIONS: meal prep, cleaning, laundry, driving, shopping, community activity, and yard work  PERSONAL FACTORS: Age, Time since onset of injury/illness/exacerbation, and 3+ comorbidities: PMH including DM, neuropathy, hx of falls/SDH are also affecting patient's functional outcome.   REHAB POTENTIAL: Good  CLINICAL DECISION MAKING: Evolving/moderate complexity  EVALUATION COMPLEXITY: Moderate   PLAN:  PT FREQUENCY: 1-2x/week  PT DURATION: 6 weeks  PLANNED INTERVENTIONS: 97750- Physical Performance Testing, 97110-Therapeutic exercises, 97530- Therapeutic activity, [id number]- Neuromuscular re-education, 97535- Self Care, [id number]- Manual therapy, [id number]- Gait training, [id number]- Orthotic Initial, [id number]- Orthotic/Prosthetic subsequent, [phone]- Canalith  repositioning, [id number]- Aquatic Therapy, and [id number]- Electrical stimulation (unattended)  PLAN FOR NEXT SESSION: strength training additions for HEP   11:13 AM, 12/24/23 M. Kelly Treyson Axel, PT, DPT Physical Therapist- [place] Office Number: [phone]   Referring diagnosis? R26.89 (ICD-10-CM) - Balance disorder Z91.81 (ICD-10-CM) - Personal history of fall E11.42 (ICD-10-CM) - Diabetic peripheral neuropathy Treatment diagnosis? (if different than referring diagnosis)  Unsteadiness on feet  Muscle weakness (generalized)  Other abnormalities of gait and mobility  Difficulty in walking, not elsewhere classified What was this (referring dx) caused by? []  Surgery []  Fall [x]  Ongoing issue []  Arthritis []  Other: ____________  Laterality: []  Rt []  Lt [x]  Both  Check all possible CPT codes:  *CHOOSE 10 OR LESS*    See Planned Interventions listed in the Plan section of the Evaluation.

## 2023-12-31 ENCOUNTER — Ambulatory Visit

## 2023-12-31 DIAGNOSIS — R2689 Other abnormalities of gait and mobility: Secondary | ICD-10-CM

## 2023-12-31 DIAGNOSIS — R262 Difficulty in walking, not elsewhere classified: Secondary | ICD-10-CM

## 2023-12-31 DIAGNOSIS — R2681 Unsteadiness on feet: Secondary | ICD-10-CM | POA: Diagnosis not present

## 2023-12-31 DIAGNOSIS — M5459 Other low back pain: Secondary | ICD-10-CM | POA: Diagnosis not present

## 2023-12-31 DIAGNOSIS — M6281 Muscle weakness (generalized): Secondary | ICD-10-CM

## 2023-12-31 NOTE — Therapy (Signed)
 OUTPATIENT PHYSICAL THERAPY VESTIBULAR TREATMENT NOTE     Patient Name: Samantha Snyder MRN: [id number] DOB:1946-02-23, 78 y.o., female Today's Date: 12/31/2023     END OF SESSION:  PT End of Session - 12/31/23 1310     Visit Number 9    Number of Visits 16    Date for PT Re-Evaluation 02/04/24    Authorization Type [employer]    Authorization Time Period auth sent 12/24/23    Authorization - Visit Number 9    Authorization - Number of Visits 13    Progress Note Due on Visit 18    PT Start Time 1315    PT Stop Time 1400    PT Time Calculation (min) 45 min    Equipment Utilized During Treatment Gait belt    Activity Tolerance Patient tolerated treatment well    Behavior During Therapy WFL for tasks assessed/performed             Past Medical History:  Diagnosis Date   Allergy Teenager   minor hay fever   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 per x ray    History of UTI 2014   per pt noted after taking Bactrim due to tooth infection   Hyperlipidemia    Neuromuscular disorder (HCC) 2010   neuropathy both feet    Osteopenia    Osteoporosis 2022   Osteoporosis   Right adrenal mass (HCC)    Biopsied in 2008 and benign   Vaginal prolapse    Status post surgery. Improved urinary incontinence   Past Surgical History:  Procedure Laterality Date   ABDOMINAL HYSTERECTOMY  09-07-2013   ABDOMINAL SACROCOLPOPEXY  09/07/2013   BLADDER SURGERY  09/07/2013   vaginal prolapse   BREAST BIOPSY Right    2014? near nipple. benign.   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   Aortic atherosclerosis (HCC) 07/08/2017   Diabetic peripheral neuropathy (HCC) 10/27/2014   Osteoporosis 03/30/2014   Essential hypertension 03/08/2014   DM (diabetes mellitus) type II controlled, neurological manifestation (HCC) 02/24/2014   Seasonal allergies 02/24/2014   Hyperlipidemia associated with type 2 diabetes mellitus (HCC) 02/24/2014   Tobacco abuse 02/24/2014    PCP: Katrinka Garnette KIDD, MD REFERRING PROVIDER: Katrinka Garnette KIDD, MD  REFERRING DIAG: R26.89 (ICD-10-CM) - Balance disorder Z91.81 (ICD-10-CM) - Personal history of fall E11.42 (ICD-10-CM) - Diabetic peripheral neuropathy  THERAPY DIAG:  Unsteadiness on feet  Muscle weakness (generalized)  Other abnormalities of gait and mobility  Difficulty in walking, not elsewhere classified  ONSET DATE: 10/11/23  Rationale for Evaluation and Treatment: Rehabilitation  SUBJECTIVE:   SUBJECTIVE STATEMENT: Not feeling the best today, feeling low energy.  Pt accompanied by: self  PERTINENT HISTORY: Patient presented to the emergency department on 10/11/2023-she tripped and fell backwards that morning and struck the back of her head- was cleaning out gutters and when moved off ramp nicked corner of it then fell backwards onto concreate on back of head.  No loss of consciousness.  Had posterior scalp pain and mild dizziness. Prior subdural hemorrhage after previous head injury she had a head CT and neck CT and thankfully no acute or traumatic findings were noted with no residual or recurrent subdural hematomas.  She did have some degenerative disc disease in the cervical spine without fracture. Was told had  concussion- did have dizziness  PAIN:  Are you having pain? No  PRECAUTIONS: Fall  RED FLAGS: None   WEIGHT BEARING RESTRICTIONS: No  FALLS: Has patient fallen in last 6 months? Yes. Number of falls 3  LIVING ENVIRONMENT: Lives with: lives with their spouse Lives in: House/apartment Stairs: ramp to enter/exit home Has following equipment at home: Single point cane and Walker - 4 wheeled  PLOF: Independent with basic ADLs, Independent with household mobility without device, and step-in shower w/ grab bars  PATIENT GOALS: improve balance,   OBJECTIVE:   TODAY'S TREATMENT: 12/31/23 Activity Comments  Vitals: 115/73 mmHg, 92 bpm   LE PRE -LAQ 2x10, 3# -seated march 2x10, 3# -hip add iso 2x10 -clamshells 2x10, grn  -hamstring curls 2x10, grn -standing hip abd 2x10, 3# -mini squats 3x5 at counter  Sidestepping marching 2 min, 3# along counter  balance Standing on foam: EO/EC, head turns EO/EC, reaching outside BOS           TODAY'S TREATMENT: 12/24/23 Activity Comments  5xSTS test 44 sec, no UE support  Berg Balance Test 47/56  10 meter walk = 1.2 ft/sec 26.19 sec w/ and without cane  HEP review W/ 2# weights                    HOME EXERCISE PROGRAM: Access Code: [email or web address] URL: [email or web address] Date: 12/03/2023 Prepared by: [facility] - Outpatient  Rehab - [facility]  Exercises - Standing Balance in Corner with Eyes Closed  - 1 x daily - 7 x weekly - 3 sets - 30 sec hold - Corner Balance Feet Apart: Eyes Open With Head Turns  - 1 x daily - 7 x weekly - 3 sets - 3-5 reps - Corner Balance Feet Together: Eyes Closed With Head Turns  - 1 x daily - 7 x weekly - 3 sets - 3-5 reps - Heel Toe Raises with Counter Support  - 1 x daily - 7 x weekly - 2-3 sets - 10 reps - 3 sec hold - Side Stepping with Counter Support  - 1 x daily - 7 x weekly - 2 sets - 10 reps - Single Leg Stance with Support  - 1 x daily - 7 x weekly - 1 sets - 3 reps - 10  sec hold - Standing Tandem Balance with Counter Support  - 1 x daily - 7 x weekly - 1 sets - 3 reps - 30 sec hold - Sit to Stand with Armchair  - 1 x daily - 7 x weekly - 5 sets - 5 reps - Mini Squat with Counter Support  - 1 x daily - 5 x weekly - 2 sets - 10 reps - Standing Hip Abduction with Counter Support  - 1 x daily - 5 x weekly - 2 sets - 10 reps -  Standing Hip Extension with Counter Support  - 1 x daily - 5 x weekly - 2 sets - 10 reps   --------------------------------------------- Note: Objective measures were completed at Evaluation unless otherwise noted.  DIAGNOSTIC FINDINGS:   COGNITION: Overall cognitive status: Within functional limits for tasks assessed   SENSATION: Notes plantar surface of feet is numb, tingling/burning extend mid-calf  EDEMA:  none  MUSCLE TONE:    DTRs:    POSTURE:  rounded shoulders, forward head, and increased thoracic kyphosis  Cervical ROM:    Grossly limited all planes from postural fault  STRENGTH:   LOWER EXTREMITY MMT:   MMT Right eval Left eval  Hip flexion 3+ 3+  Hip abduction 3- 3  Hip adduction 4 4  Hip internal rotation    Hip external rotation    Knee flexion 4 4  Knee extension 4- 4  Ankle dorsiflexion 3+ 4  Ankle plantarflexion    Ankle inversion    Ankle eversion    (Blank rows = not tested)  BED MOBILITY:  Reports indep  TRANSFERS: Assistive device utilized: None  Sit to stand: Modified independence Stand to sit: Modified independence Chair to chair: Modified independence Floor: NT--reports requires assistance   CURB: CGA  GAIT: Gait pattern: decreased stride length and poor foot clearance- Right Distance walked:  Assistive device utilized: Single point cane Level of assistance: SBA Comments:   FUNCTIONAL TESTS:  5 times sit to stand: 26 sec w/ UE support Berg Balance Scale: 39/56 10 meter walk test: 26 sec = 1.2 ft/sec M-CTSIB  Condition 1: Firm Surface, EO 30 Sec, Mild Sway   Condition 2: Firm Surface, EC 30 Sec, Moderate Sway  Condition 3: Foam Surface, EO 30 Sec, Moderate Sway  Condition 4: Foam Surface, EC 3 Sec, Severe Sway         VESTIBULAR ASSESSMENT:  GENERAL OBSERVATION: not conducted as pt denies any symptoms consistent with concussion  OTHOSTATICS: not done                                                                                                                              GOALS: Goals reviewed with patient? Yes  SHORT TERM GOALS: Target date: 01/14/2024      Patient will be independent in HEP to improve functional outcomes Baseline: cues for sequence/performance Goal status: IN PROGRESS (11/25/23)  2.  Demo improved BLE strength and reduce risk for falls as evident by 5xSTS test in 20 sec w/out UE  Baseline: 26 sec w/ UE support; 20 sec w/ BUE support; 44 sec w/out UE Goal status: IN PROGRESS (12/24/23)  3.  Demo improved postural stability per mild sway x 30 sec condition 2 M-CTSIB to improve safety with ADL Baseline: moderate sway x 30; mild x 30 Goal status: MET(11/25/23)    LONG TERM GOALS: Target date: 02/04/24    Demo low risk for falls per score 47/56 Berg Balance Test Baseline: 39/56; 47/56 Goal status:  MET 12/24/23  2.  Demo improved safety and efficiency with ambulation with adequate right foot clearance and sustaining speed of 2.8 ft/sec Baseline: 1.2 ft/sec w/ cane Goal status: IN PROGRESS 12/24/23  3.  Demo improved postural stability per mild sway x 30 sec condition 4 M-CTSIB to improve safety with ADL Baseline: severe x 3 sec; mod-severe x 6 sec Goal status: IN PROGRESS (12/24/23)  4.  Improve right hip abduction strength to 4/5 for improved stability/single limb support Baseline: 3-/5; 3/5  Goal status: IN PROGRESS 12/24/23    ASSESSMENT:  CLINICAL IMPRESSION: Continued with strength training to improve activity tolerance and LE strength for mobility with instruction in cues in position/sequence for  optimal recruitment with good carryover demo.  Dynamic balance activities to improve single limb support, weight shift, change of direction to improve mobility and reduce risk for falls. Static multisensory balance to improve postural stability/awareness for safety during ADL and reaching outside BOS.  Good activity tolerance demo with only 3 therapeutic rest periods required during session. Continued sessions to progress POC details to improve mobility and reduce risk for falls.   EVAL:  Patient is a 78 y.o. lady who was seen today for physical therapy evaluation and treatment for balance and mobility deficits.  Has hx of peripheral neuropathy and recent hx of falling and striking head.  At present, it does not seem she is experiencing any lingering deficits related to concussion/head trauma.  Her chief complaint is balance deficits and LE weakness which is affecting her mobility and increasing risk for falls.  Outcome measures reveal high risk for falls and gait/posture deviations present with poor right foot clearance in swing phase and decreased gait speed/stride length requiring increased double limb support.  Poor proprioception and postural stability evident by assessment.  Pt would benefit from PT sessions to address deficits/limitations to improve safety with functional mobility and reduce risk for falls.    OBJECTIVE IMPAIRMENTS: Abnormal gait, decreased activity tolerance, decreased balance, decreased mobility, difficulty walking, decreased strength, and postural dysfunction.   ACTIVITY LIMITATIONS: carrying, lifting, bending, squatting, stairs, transfers, reach over head, and locomotion level  PARTICIPATION LIMITATIONS: meal prep, cleaning, laundry, driving, shopping, community activity, and yard work  PERSONAL FACTORS: Age, Time since onset of injury/illness/exacerbation, and 3+ comorbidities: PMH including DM, neuropathy, hx of falls/SDH are also affecting patient's functional outcome.    REHAB POTENTIAL: Good  CLINICAL DECISION MAKING: Evolving/moderate complexity  EVALUATION COMPLEXITY: Moderate   PLAN:  PT FREQUENCY: 1-2x/week  PT DURATION: 6 weeks  PLANNED INTERVENTIONS: 97750- Physical Performance Testing, 97110-Therapeutic exercises, 97530- Therapeutic activity, [id number]- Neuromuscular re-education, 97535- Self Care, [id number]- Manual therapy, [id number]- Gait training, [id number]- Orthotic Initial, [id number]- Orthotic/Prosthetic subsequent, [phone]- Canalith repositioning, [id number]- Aquatic Therapy, and H9716- Electrical stimulation (unattended)  PLAN FOR NEXT SESSION: strength training additions for HEP   1:11 PM, 12/31/23 M. Kelly Isair Inabinet, PT, DPT Physical Therapist- [place] Office Number: [phone]

## 2024-01-05 ENCOUNTER — Other Ambulatory Visit: Payer: Self-pay

## 2024-01-05 ENCOUNTER — Encounter: Payer: Self-pay | Admitting: Physical Therapy

## 2024-01-05 ENCOUNTER — Ambulatory Visit: Admitting: Physical Therapy

## 2024-01-05 DIAGNOSIS — M5459 Other low back pain: Secondary | ICD-10-CM | POA: Diagnosis not present

## 2024-01-05 DIAGNOSIS — Z122 Encounter for screening for malignant neoplasm of respiratory organs: Secondary | ICD-10-CM

## 2024-01-05 DIAGNOSIS — R2689 Other abnormalities of gait and mobility: Secondary | ICD-10-CM

## 2024-01-05 DIAGNOSIS — R262 Difficulty in walking, not elsewhere classified: Secondary | ICD-10-CM | POA: Diagnosis not present

## 2024-01-05 DIAGNOSIS — M6281 Muscle weakness (generalized): Secondary | ICD-10-CM | POA: Diagnosis not present

## 2024-01-05 DIAGNOSIS — R2681 Unsteadiness on feet: Secondary | ICD-10-CM | POA: Diagnosis not present

## 2024-01-05 DIAGNOSIS — Z87891 Personal history of nicotine dependence: Secondary | ICD-10-CM

## 2024-01-05 DIAGNOSIS — F17210 Nicotine dependence, cigarettes, uncomplicated: Secondary | ICD-10-CM

## 2024-01-05 NOTE — Therapy (Signed)
 OUTPATIENT PHYSICAL THERAPY VESTIBULAR TREATMENT NOTE     Patient Name: Samantha Snyder MRN: [id number] DOB:10/08/1945, 78 y.o., female Today's Date: 01/05/2024     END OF SESSION:  PT End of Session - 01/05/24 0753     Visit Number 10    Number of Visits 16    Date for PT Re-Evaluation 02/04/24    Authorization Type [employer]    Authorization Time Period auth sent 12/24/23    Authorization - Visit Number 10    Authorization - Number of Visits 13    Progress Note Due on Visit 18    PT Start Time 0804    PT Stop Time 0844    PT Time Calculation (min) 40 min    Equipment Utilized During Treatment Gait belt    Activity Tolerance Patient tolerated treatment well    Behavior During Therapy WFL for tasks assessed/performed             Past Medical History:  Diagnosis Date   Allergy Teenager   minor hay fever   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 per x ray    History of UTI 2014   per pt noted after taking Bactrim due to tooth infection   Hyperlipidemia    Neuromuscular disorder (HCC) 2010   neuropathy both feet    Osteopenia    Osteoporosis 2022   Osteoporosis   Right adrenal mass (HCC)    Biopsied in 2008 and benign   Vaginal prolapse    Status post surgery. Improved urinary incontinence   Past Surgical History:  Procedure Laterality Date   ABDOMINAL HYSTERECTOMY  09-07-2013   ABDOMINAL SACROCOLPOPEXY  09/07/2013   BLADDER SURGERY  09/07/2013   vaginal prolapse   BREAST BIOPSY Right    2014? near nipple. benign.   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   Aortic atherosclerosis (HCC) 07/08/2017   Diabetic peripheral neuropathy (HCC) 10/27/2014   Osteoporosis 03/30/2014   Essential hypertension 03/08/2014   DM (diabetes mellitus) type II controlled, neurological manifestation (HCC) 02/24/2014   Seasonal allergies 02/24/2014   Hyperlipidemia associated with type 2 diabetes mellitus (HCC) 02/24/2014   Tobacco abuse 02/24/2014    PCP: Katrinka Garnette KIDD, MD REFERRING PROVIDER: Katrinka Garnette KIDD, MD  REFERRING DIAG: R26.89 (ICD-10-CM) - Balance disorder Z91.81 (ICD-10-CM) - Personal history of fall E11.42 (ICD-10-CM) - Diabetic peripheral neuropathy  THERAPY DIAG:  Unsteadiness on feet  Muscle weakness (generalized)  Other abnormalities of gait and mobility  ONSET DATE: 10/11/23  Rationale for Evaluation and Treatment: Rehabilitation  SUBJECTIVE:   SUBJECTIVE STATEMENT: The exercises and weights are going well at home.  No falls.  Pt accompanied  by: self  PERTINENT HISTORY: Patient presented to the emergency department on 10/11/2023-she tripped and fell backwards that morning and struck the back of her head- was cleaning out gutters and when moved off ramp nicked corner of it then fell backwards onto concreate on back of head.  No loss of consciousness.  Had posterior scalp pain and mild dizziness. Prior subdural hemorrhage after previous head injury she had a head CT and neck CT and thankfully no acute or traumatic findings were noted with no residual or recurrent subdural hematomas.  She did have some degenerative disc disease in the cervical spine without fracture. Was told had concussion- did have dizziness  PAIN:   Are you having pain? No  PRECAUTIONS: Fall  RED FLAGS: None   WEIGHT BEARING RESTRICTIONS: No  FALLS: Has patient fallen in last 6 months? Yes. Number of falls 3  LIVING ENVIRONMENT: Lives with: lives with their spouse Lives in: House/apartment Stairs: ramp to enter/exit home Has following equipment at home: Single point cane and Walker - 4 wheeled  PLOF: Independent with basic ADLs, Independent with household mobility without device, and step-in shower w/ grab bars  PATIENT GOALS: improve balance,   OBJECTIVE:       TODAY'S TREATMENT: 01/05/24 Activity Comments     LE PRE -LAQ 2x10, 3# -seated march 2x10, 3# -hip add iso 2x10 -clamshells 2x10, green  -hamstring curls 2x10, green -standing hip abd 2x10, 3# -standing hip ext 10, 3# -mini squats 3x5 at parallel bars-cues for glut activation  Sidestepping R and L, over low obstacle Forward step over obstacles Forward/Backwards walking 1 min, 3# along parallel bars  2 min 2 min, BUE support >1 UE support  Forwards/back walking, no weight in parallel bars  1 min-cues for equal even step length              HOME EXERCISE PROGRAM: Access Code: [email or web address] URL: [email or web address] Date: 12/03/2023 Prepared by: [facility] - Outpatient  Rehab - [facility]  Exercises - Standing Balance in Corner with Eyes Closed  - 1 x daily - 7 x weekly - 3 sets - 30 sec hold - Corner Balance Feet Apart: Eyes Open With Head Turns  - 1 x daily - 7 x weekly - 3 sets - 3-5 reps - Corner Balance Feet Together: Eyes Closed With Head Turns  - 1 x daily - 7 x weekly - 3 sets - 3-5 reps - Heel Toe Raises with Counter Support  - 1 x daily - 7 x weekly - 2-3 sets - 10 reps - 3 sec hold - Side Stepping with Counter Support  - 1 x daily - 7 x weekly - 2 sets - 10 reps - Single Leg Stance with Support  - 1 x daily - 7 x weekly - 1 sets - 3 reps - 10 sec hold - Standing Tandem Balance with Counter Support  - 1 x daily - 7 x  weekly - 1 sets - 3 reps - 30 sec hold - Sit to Stand with Armchair  - 1 x daily - 7 x weekly - 5 sets - 5 reps - Mini Squat with Counter Support  - 1 x daily - 5 x weekly - 2 sets - 10 reps - Standing Hip Abduction with Counter Support  - 1 x daily - 5 x weekly - 2 sets - 10 reps - Standing Hip Extension with Counter Support  - 1 x daily - 5 x weekly - 2 sets - 10  reps   --------------------------------------------- Note: Objective measures were completed at Evaluation unless otherwise noted.  DIAGNOSTIC FINDINGS:   COGNITION: Overall cognitive status: Within functional limits for tasks assessed   SENSATION: Notes plantar surface of feet is numb, tingling/burning extend mid-calf  EDEMA:  none  MUSCLE TONE:    DTRs:    POSTURE:  rounded shoulders, forward head, and increased thoracic kyphosis  Cervical ROM:    Grossly limited all planes from postural fault  STRENGTH:   LOWER EXTREMITY MMT:   MMT Right eval Left eval  Hip flexion 3+ 3+  Hip abduction 3- 3  Hip adduction 4 4  Hip internal rotation    Hip external rotation    Knee flexion 4 4  Knee extension 4- 4  Ankle dorsiflexion 3+ 4  Ankle plantarflexion    Ankle inversion    Ankle eversion    (Blank rows = not tested)  BED MOBILITY:  Reports indep  TRANSFERS: Assistive device utilized: None  Sit to stand: Modified independence Stand to sit: Modified independence Chair to chair: Modified independence Floor: NT--reports requires assistance   CURB: CGA  GAIT: Gait pattern: decreased stride length and poor foot clearance- Right Distance walked:  Assistive device utilized: Single point cane Level of assistance: SBA Comments:   FUNCTIONAL TESTS:  5 times sit to stand: 26 sec w/ UE support Berg Balance Scale: 39/56 10 meter walk test: 26 sec = 1.2 ft/sec M-CTSIB  Condition 1: Firm Surface, EO 30 Sec, Mild Sway  Condition 2: Firm Surface, EC 30 Sec, Moderate Sway  Condition 3: Foam Surface,  EO 30 Sec, Moderate Sway  Condition 4: Foam Surface, EC 3 Sec, Severe Sway         VESTIBULAR ASSESSMENT:  GENERAL OBSERVATION: not conducted as pt denies any symptoms consistent with concussion  OTHOSTATICS: not done                                                                                                                              GOALS: Goals reviewed with patient? Yes  SHORT TERM GOALS: Target date: 01/14/2024      Patient will be independent in HEP to improve functional outcomes Baseline: cues for sequence/performance Goal status: IN PROGRESS (11/25/23)  2.  Demo improved BLE strength and reduce risk for falls as evident by 5xSTS test in 20 sec w/out UE  Baseline: 26 sec w/ UE support; 20 sec w/ BUE support; 44 sec w/out UE Goal status: IN PROGRESS (12/24/23)  3.  Demo improved postural stability per mild sway x 30 sec condition 2 M-CTSIB to improve safety with ADL Baseline: moderate sway x 30; mild x 30 Goal status: MET(11/25/23)    LONG TERM GOALS: Target date: 02/04/24    Demo low risk for falls per score 47/56 Berg Balance Test Baseline: 39/56; 47/56 Goal status: MET 12/24/23  2.  Demo improved safety and efficiency with ambulation with adequate right foot clearance and sustaining speed  of 2.8 ft/sec Baseline: 1.2 ft/sec w/ cane Goal status: IN PROGRESS 12/24/23  3.  Demo improved postural stability per mild sway x 30 sec condition 4 M-CTSIB to improve safety with ADL Baseline: severe x 3 sec; mod-severe x 6 sec Goal status: IN PROGRESS (12/24/23)  4.  Improve right hip abduction strength to 4/5 for improved stability/single limb support Baseline: 3-/5; 3/5  Goal status: IN PROGRESS 12/24/23    ASSESSMENT:  CLINICAL IMPRESSION: Pt presents today with no new complaints. Skilled PT session focused on strengthening and balance exercises; in standing, worked on activities to increase SLS and foot clearance. Pt needs BUE>1 UE support throughout and feels good  about the challenge provided. No complaints at end of session.  Pt will continue to benefit from skilled PT towards goals for improved functional mobility and decreased fall risk.   EVAL:  Patient is a 78 y.o. lady who was seen today for physical therapy evaluation and treatment for balance and mobility deficits.  Has hx of peripheral neuropathy and recent hx of falling and striking head.  At present, it does not seem she is experiencing any lingering deficits related to concussion/head trauma.  Her chief complaint is balance deficits and LE weakness which is affecting her mobility and increasing risk for falls.  Outcome measures reveal high risk for falls and gait/posture deviations present with poor right foot clearance in swing phase and decreased gait speed/stride length requiring increased double limb support.  Poor proprioception and postural stability evident by assessment.  Pt would benefit from PT sessions to address deficits/limitations to improve safety with functional mobility and reduce risk for falls.    OBJECTIVE IMPAIRMENTS: Abnormal gait, decreased activity tolerance, decreased balance, decreased mobility, difficulty walking, decreased strength, and postural dysfunction.   ACTIVITY LIMITATIONS: carrying, lifting, bending, squatting, stairs, transfers, reach over head, and locomotion level  PARTICIPATION LIMITATIONS: meal prep, cleaning, laundry, driving, shopping, community activity, and yard work  PERSONAL FACTORS: Age, Time since onset of injury/illness/exacerbation, and 3+ comorbidities: PMH including DM, neuropathy, hx of falls/SDH are also affecting patient's functional outcome.   REHAB POTENTIAL: Good  CLINICAL DECISION MAKING: Evolving/moderate complexity  EVALUATION COMPLEXITY: Moderate   PLAN:  PT FREQUENCY: 1-2x/week  PT DURATION: 6 weeks  PLANNED INTERVENTIONS: 97750- Physical Performance Testing, 97110-Therapeutic exercises, 97530- Therapeutic activity, 97112-  Neuromuscular re-education, 97535- Self Care, 02859- Manual therapy, [phone]- Gait training, [phone]- Orthotic Initial, [phone]- Orthotic/Prosthetic subsequent, [phone]- Canalith repositioning, [id number]- Aquatic Therapy, and [phone]- Electrical stimulation (unattended)  PLAN FOR NEXT SESSION: Step and weightshift, obstacle negotiation, forward/back stepping   Greig Anon, PT 01/05/24 8:47 AM Phone: [phone] Fax: [phone]  [facility] at [facility] [address], [address] [place], [place] [address] Phone # [phone] Fax # [phone]

## 2024-01-07 ENCOUNTER — Ambulatory Visit: Admitting: Physical Therapy

## 2024-01-07 DIAGNOSIS — R262 Difficulty in walking, not elsewhere classified: Secondary | ICD-10-CM | POA: Diagnosis not present

## 2024-01-07 DIAGNOSIS — R2689 Other abnormalities of gait and mobility: Secondary | ICD-10-CM

## 2024-01-07 DIAGNOSIS — R2681 Unsteadiness on feet: Secondary | ICD-10-CM

## 2024-01-07 DIAGNOSIS — M5459 Other low back pain: Secondary | ICD-10-CM | POA: Diagnosis not present

## 2024-01-07 DIAGNOSIS — M6281 Muscle weakness (generalized): Secondary | ICD-10-CM | POA: Diagnosis not present

## 2024-01-07 NOTE — Therapy (Signed)
 OUTPATIENT PHYSICAL THERAPY VESTIBULAR TREATMENT NOTE     Patient Name: Samantha Snyder MRN: [id number] DOB:[birth year], 78 y.o., female Today's Date: 01/07/2024     END OF SESSION:  PT End of Session - 01/07/24 0938     Visit Number 11    Number of Visits 16    Date for PT Re-Evaluation 02/04/24    Authorization Type [employer]    Authorization Time Period auth sent 12/24/23    Authorization - Visit Number 11    Authorization - Number of Visits 13    Progress Note Due on Visit 18    PT Start Time 0935    PT Stop Time 1014    PT Time Calculation (min) 39 min    Equipment Utilized During Treatment Gait belt    Activity Tolerance Patient tolerated treatment well    Behavior During Therapy WFL for tasks assessed/performed              Past Medical History:  Diagnosis Date   Allergy Teenager   minor hay fever   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 per x ray    History of UTI 2014   per pt noted after taking Bactrim due to tooth infection   Hyperlipidemia    Neuromuscular disorder (HCC) 2010   neuropathy both feet    Osteopenia    Osteoporosis 2022   Osteoporosis   Right adrenal mass (HCC)    Biopsied in 2008 and benign   Vaginal prolapse    Status post surgery. Improved urinary incontinence   Past Surgical History:  Procedure Laterality Date   ABDOMINAL HYSTERECTOMY  09-07-2013   ABDOMINAL SACROCOLPOPEXY  09/07/2013   BLADDER SURGERY  09/07/2013   vaginal prolapse   BREAST BIOPSY Right    2014? near nipple. benign.   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   Aortic atherosclerosis (HCC) 07/08/2017   Diabetic peripheral neuropathy (HCC) 10/27/2014   Osteoporosis 03/30/2014   Essential hypertension 03/08/2014   DM (diabetes mellitus) type II controlled, neurological manifestation (HCC) 02/24/2014   Seasonal allergies 02/24/2014   Hyperlipidemia associated with type 2 diabetes mellitus (HCC) 02/24/2014   Tobacco abuse 02/24/2014    PCP: Katrinka Garnette KIDD, MD REFERRING PROVIDER: Katrinka Garnette KIDD, MD  REFERRING DIAG: R26.89 (ICD-10-CM) - Balance disorder Z91.81 (ICD-10-CM) - Personal history of fall E11.42 (ICD-10-CM) - Diabetic peripheral neuropathy  THERAPY DIAG:  Unsteadiness on feet  Muscle weakness (generalized)  Other abnormalities of gait and mobility  ONSET DATE: 10/11/23  Rationale for Evaluation and Treatment: Rehabilitation  SUBJECTIVE:   SUBJECTIVE STATEMENT: I can tell I did the heavier weights last visit; I was sore, but  it's better.  No falls.  Use the weights at home for the elliptical and standing exercises.  Pt accompanied by: self  PERTINENT HISTORY: Patient presented to the emergency department on 10/11/2023-she tripped and fell backwards that morning and struck the back of her head- was cleaning out gutters and when moved off ramp nicked corner of it then fell backwards onto concreate on back of head.  No loss of consciousness.  Had posterior scalp pain and mild dizziness. Prior subdural hemorrhage after previous head injury she had a head CT and neck CT and thankfully no acute or traumatic findings were noted with no residual or recurrent subdural hematomas.  She did have some degenerative disc  disease in the cervical spine without fracture. Was told had concussion- did have dizziness  PAIN:  Are you having pain? No  PRECAUTIONS: Fall  RED FLAGS: None   WEIGHT BEARING RESTRICTIONS: No  FALLS: Has patient fallen in last 6 months? Yes. Number of falls 3  LIVING ENVIRONMENT: Lives with: lives with their spouse Lives in: House/apartment Stairs: ramp to enter/exit home Has following equipment at home: Single point cane and Walker - 4 wheeled  PLOF: Independent with basic ADLs, Independent with household mobility without device, and step-in shower w/ grab bars  PATIENT GOALS: improve balance,   OBJECTIVE:      TODAY'S TREATMENT: 01/07/2024 Activity Comments  Sidestepping Forwards/back walking 2 min each, BUE support, cues for increased foot clearance  -Sidestepping over low obstacle, 10 reps -Forward step over obstacles, x 10 -Forward/backward stepping 2 x 10 reps  BUE support  1 UE support  BUE>1 UE support  Standing on incline:  EO/EC 30 sec, marching in place BUE support Assistance/cues to use hip strategy to lessen posterior lean with EC  Standing decline:  EO/EC 30 sec, then marching in place x 10 with BUE support             HOME EXERCISE PROGRAM: Access Code: [email or web address] URL: [email or web address] Date: 01/07/2024 Prepared by: [facility] - Outpatient  Rehab - [facility]  Exercises - Standing Balance in Corner with Eyes Closed  - 1 x daily - 7 x weekly - 3 sets - 30 sec hold - Corner Balance Feet Apart: Eyes Open With Head Turns  - 1 x daily - 7 x weekly - 3 sets - 3-5 reps - Corner Balance Feet Together: Eyes Closed With Head Turns  - 1 x daily - 7 x weekly - 3 sets - 3-5 reps - Heel Toe Raises with Counter Support  - 1 x daily - 7 x weekly - 2-3 sets - 10 reps - 3 sec hold - Side Stepping with Counter Support  - 1 x daily - 7 x weekly - 2 sets - 10 reps - Single Leg Stance with Support  - 1 x daily - 7 x weekly - 1 sets - 3 reps -  10 sec hold - Standing Tandem Balance with Counter Support  - 1 x daily - 7 x weekly - 1 sets - 3 reps - 30 sec hold - Sit to Stand with Armchair  - 1 x daily - 7 x weekly - 5 sets - 5 reps - Mini Squat with Counter Support  - 1 x daily - 5 x weekly - 2 sets - 10 reps - Standing Hip Abduction with Counter Support  - 1 x daily - 5 x weekly - 2 sets - 10 reps - Standing Hip Extension with Counter Support  -  1 x daily - 5 x weekly - 2 sets - 10 reps - Staggered Stance Step Throughs  - 1 x daily - 7 x weekly - 2 sets - 10 reps    PATIENT EDUCATION: Education details: HEP update Person educated: Patient Education method: Explanation, Demonstration, Verbal cues, and Handouts Education comprehension: verbalized understanding, returned demonstration, and needs further education     --------------------------------------------- Note: Objective measures were completed at Evaluation unless otherwise noted.  DIAGNOSTIC FINDINGS:   COGNITION: Overall cognitive status: Within functional limits for tasks assessed   SENSATION: Notes plantar surface of feet is numb, tingling/burning extend mid-calf  EDEMA:  none  MUSCLE TONE:    DTRs:    POSTURE:  rounded shoulders, forward head, and increased thoracic kyphosis  Cervical ROM:    Grossly limited all planes from postural fault  STRENGTH:   LOWER EXTREMITY MMT:   MMT Right eval Left eval  Hip flexion 3+ 3+  Hip abduction 3- 3  Hip adduction 4 4  Hip internal rotation    Hip external rotation    Knee flexion 4 4  Knee extension 4- 4  Ankle dorsiflexion 3+ 4  Ankle plantarflexion    Ankle inversion    Ankle eversion    (Blank rows = not tested)  BED MOBILITY:  Reports indep  TRANSFERS: Assistive device utilized: None  Sit to stand: Modified independence Stand to sit: Modified independence Chair to chair: Modified independence Floor: NT--reports requires assistance   CURB: CGA  GAIT: Gait pattern: decreased  stride length and poor foot clearance- Right Distance walked:  Assistive device utilized: Single point cane Level of assistance: SBA Comments:   FUNCTIONAL TESTS:  5 times sit to stand: 26 sec w/ UE support Berg Balance Scale: 39/56 10 meter walk test: 26 sec = 1.2 ft/sec M-CTSIB  Condition 1: Firm Surface, EO 30 Sec, Mild Sway  Condition 2: Firm Surface, EC 30 Sec, Moderate Sway  Condition 3: Foam Surface, EO 30 Sec, Moderate Sway  Condition 4: Foam Surface, EC 3 Sec, Severe Sway         VESTIBULAR ASSESSMENT:  GENERAL OBSERVATION: not conducted as pt denies any symptoms consistent with concussion  OTHOSTATICS: not done                                                                                                                              GOALS: Goals reviewed with patient? Yes  SHORT TERM GOALS: Target date: 01/14/2024      Patient will be independent in HEP to improve functional outcomes Baseline: cues for sequence/performance Goal status: IN PROGRESS (11/25/23)  2.  Demo improved BLE strength and reduce risk for falls as evident by 5xSTS test in 20 sec w/out UE  Baseline: 26 sec w/ UE support; 20 sec w/ BUE support; 44 sec w/out UE Goal status: IN PROGRESS (12/24/23)  3.  Demo improved postural stability per mild sway x 30 sec condition 2 M-CTSIB  to improve safety with ADL Baseline: moderate sway x 30; mild x 30 Goal status: MET(11/25/23)    LONG TERM GOALS: Target date: 02/04/24    Demo low risk for falls per score 47/56 Berg Balance Test Baseline: 39/56; 47/56 Goal status: MET 12/24/23  2.  Demo improved safety and efficiency with ambulation with adequate right foot clearance and sustaining speed of 2.8 ft/sec Baseline: 1.2 ft/sec w/ cane Goal status: IN PROGRESS 12/24/23  3.  Demo improved postural stability per mild sway x 30 sec condition 4 M-CTSIB to improve safety with ADL Baseline: severe x 3 sec; mod-severe x 6 sec Goal status: IN PROGRESS  (12/24/23)  4.  Improve right hip abduction strength to 4/5 for improved stability/single limb support Baseline: 3-/5; 3/5  Goal status: IN PROGRESS 12/24/23    ASSESSMENT:  CLINICAL IMPRESSION: Pt presents today reporting some soreness from weights with exercises last visit; she does report that soreness has subsided. Skilled PT session focused on mostly balance work today, to address foot clearance, step length as well as unlevel surfaces. Pt needs BUE support and is able to go to 1 UE support with some exercises; added to HEP forward/back stepping exercise for foot clearance and step length.  She has not complaints at end of session.  Pt will continue to benefit from skilled PT towards goals for improved functional mobility and decreased fall risk.   EVAL:  Patient is a 78 y.o. lady who was seen today for physical therapy evaluation and treatment for balance and mobility deficits.  Has hx of peripheral neuropathy and recent hx of falling and striking head.  At present, it does not seem she is experiencing any lingering deficits related to concussion/head trauma.  Her chief complaint is balance deficits and LE weakness which is affecting her mobility and increasing risk for falls.  Outcome measures reveal high risk for falls and gait/posture deviations present with poor right foot clearance in swing phase and decreased gait speed/stride length requiring increased double limb support.  Poor proprioception and postural stability evident by assessment.  Pt would benefit from PT sessions to address deficits/limitations to improve safety with functional mobility and reduce risk for falls.    OBJECTIVE IMPAIRMENTS: Abnormal gait, decreased activity tolerance, decreased balance, decreased mobility, difficulty walking, decreased strength, and postural dysfunction.   ACTIVITY LIMITATIONS: carrying, lifting, bending, squatting, stairs, transfers, reach over head, and locomotion level  PARTICIPATION  LIMITATIONS: meal prep, cleaning, laundry, driving, shopping, community activity, and yard work  PERSONAL FACTORS: Age, Time since onset of injury/illness/exacerbation, and 3+ comorbidities: PMH including DM, neuropathy, hx of falls/SDH are also affecting patient's functional outcome.   REHAB POTENTIAL: Good  CLINICAL DECISION MAKING: Evolving/moderate complexity  EVALUATION COMPLEXITY: Moderate   PLAN:  PT FREQUENCY: 1-2x/week  PT DURATION: 6 weeks  PLANNED INTERVENTIONS: 97750- Physical Performance Testing, 97110-Therapeutic exercises, 97530- Therapeutic activity, 97112- Neuromuscular re-education, 97535- Self Care, 02859- Manual therapy, [phone]- Gait training, [phone]- Orthotic Initial, [phone]- Orthotic/Prosthetic subsequent, [phone]- Canalith repositioning, [id number]- Aquatic Therapy, and [phone]- Electrical stimulation (unattended)  PLAN FOR NEXT SESSION: Review addition to HEP, continue to work on Step and weightshift, obstacle negotiation, forward/back stepping, incline/decline balance   Greig Anon, PT 01/07/24 10:13 AM Phone: [phone] Fax: [phone]  [facility] at [facility] [address], [address] [place], [place] [address] Phone # [phone] Fax # [phone]

## 2024-01-12 ENCOUNTER — Ambulatory Visit

## 2024-01-12 DIAGNOSIS — R262 Difficulty in walking, not elsewhere classified: Secondary | ICD-10-CM

## 2024-01-12 DIAGNOSIS — R2681 Unsteadiness on feet: Secondary | ICD-10-CM

## 2024-01-12 DIAGNOSIS — M6281 Muscle weakness (generalized): Secondary | ICD-10-CM | POA: Diagnosis not present

## 2024-01-12 DIAGNOSIS — R2689 Other abnormalities of gait and mobility: Secondary | ICD-10-CM

## 2024-01-12 DIAGNOSIS — M5459 Other low back pain: Secondary | ICD-10-CM | POA: Diagnosis not present

## 2024-01-12 NOTE — Therapy (Signed)
 OUTPATIENT PHYSICAL THERAPY VESTIBULAR TREATMENT NOTE     Patient Name: Samantha Snyder MRN: [id number] DOB:1946/04/22, 78 y.o., female Today's Date: 01/12/2024     END OF SESSION:  PT End of Session - 01/12/24 1138     Visit Number 12    Number of Visits 16    Date for PT Re-Evaluation 02/04/24    Authorization Type [employer]    Authorization Time Period 9 visits approved from 7/2 to 01/28/24    Authorization - Visit Number --    Authorization - Number of Visits 9    Progress Note Due on Visit 18    PT Start Time 1145    PT Stop Time 1230    PT Time Calculation (min) 45 min    Equipment Utilized During Treatment Gait belt    Activity Tolerance Patient tolerated treatment well    Behavior During Therapy WFL for tasks assessed/performed              Past Medical History:  Diagnosis Date   Allergy Teenager   minor hay fever   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 per x ray    History of UTI 2014   per pt noted after taking Bactrim due to tooth infection   Hyperlipidemia    Neuromuscular disorder (HCC) 2010   neuropathy both feet    Osteopenia    Osteoporosis 2022   Osteoporosis   Right adrenal mass (HCC)    Biopsied in 2008 and benign   Vaginal prolapse    Status post surgery. Improved urinary incontinence   Past Surgical History:  Procedure Laterality Date   ABDOMINAL HYSTERECTOMY  09-07-2013   ABDOMINAL SACROCOLPOPEXY  09/07/2013   BLADDER SURGERY  09/07/2013   vaginal prolapse   BREAST BIOPSY Right    2014? near nipple. benign.   COLONOSCOPY  11/06/2005   sig tics, no polyps-[facility]    CYSTOSCOPY  09/07/2013   ENTEROCELE REPAIR  09/07/2013    INDUCED ABORTION  1985   Therapeutic   LAPAROSCOPIC BILATERAL SALPINGO OOPHERECTOMY  09/07/2013   POLYPECTOMY     PUBOVAGINAL SLING  09/07/2013   TONSILLECTOMY Bilateral 1955   TUBAL LIGATION  07/31/1984   Patient Active Problem List   Diagnosis Date Noted   History of subdural hematoma 10/05/2021   Emphysema lung (HCC) 12/19/2020   Aortic atherosclerosis (HCC) 07/08/2017   Diabetic peripheral neuropathy (HCC) 10/27/2014   Osteoporosis 03/30/2014   Essential hypertension 03/08/2014   DM (diabetes mellitus) type II controlled, neurological manifestation (HCC) 02/24/2014   Seasonal allergies 02/24/2014   Hyperlipidemia associated with type 2 diabetes mellitus (HCC) 02/24/2014   Tobacco abuse 02/24/2014    PCP: Katrinka Garnette KIDD, MD REFERRING PROVIDER: Katrinka Garnette KIDD, MD  REFERRING DIAG: R26.89 (ICD-10-CM) - Balance disorder Z91.81 (ICD-10-CM) - Personal history of fall E11.42 (ICD-10-CM) - Diabetic peripheral neuropathy  THERAPY DIAG:  Unsteadiness on feet  Muscle weakness (generalized)  Other abnormalities of gait and mobility  Difficulty in walking, not elsewhere classified  ONSET DATE: 10/11/23  Rationale for Evaluation and Treatment: Rehabilitation  SUBJECTIVE:   SUBJECTIVE STATEMENT: Doing ok been  using the weights for strength at home and the elliptical 2x30 min  Pt accompanied by: self  PERTINENT HISTORY: Patient presented to the emergency department on 10/11/2023-she tripped and fell backwards that morning and struck the back of her head- was cleaning out gutters and when moved off ramp nicked corner of it then fell backwards onto concreate on back of head.  No loss of consciousness.  Had posterior scalp pain and mild dizziness. Prior subdural hemorrhage after previous head injury she had a head CT and neck CT and thankfully no acute or traumatic findings were noted with no residual or recurrent subdural hematomas.  She did have some degenerative disc disease in  the cervical spine without fracture. Was told had concussion- did have dizziness  PAIN:  Are you having pain? No  PRECAUTIONS: Fall  RED FLAGS: None   WEIGHT BEARING RESTRICTIONS: No  FALLS: Has patient fallen in last 6 months? Yes. Number of falls 3  LIVING ENVIRONMENT: Lives with: lives with their spouse Lives in: House/apartment Stairs: ramp to enter/exit home Has following equipment at home: Single point cane and Walker - 4 wheeled  PLOF: Independent with basic ADLs, Independent with household mobility without device, and step-in shower w/ grab bars  PATIENT GOALS: improve balance,   OBJECTIVE:    TODAY'S TREATMENT: 01/12/24 Activity Comments  LAQ 3x10 4#  Alt stair taps x 60 sec 4#, 4 box, cane  Sidestepping x 2 min 4# along counter  Standing hip abd 2x10 4#  Standing hamstring curls 2x10 4#  Sit to stand 1x5 arm rest/sink 2x5 green band resist  Static multisensory balance       TODAY'S TREATMENT: 01/07/2024 Activity Comments  Sidestepping Forwards/back walking 2 min each, BUE support, cues for increased foot clearance  -Sidestepping over low obstacle, 10 reps -Forward step over obstacles, x 10 -Forward/backward stepping 2 x 10 reps  BUE support  1 UE support  BUE>1 UE support  Standing on incline:  EO/EC 30 sec, marching in place BUE support Assistance/cues to use hip strategy to lessen posterior lean with EC  Standing decline:  EO/EC 30 sec, then marching in place x 10 with BUE support             HOME EXERCISE PROGRAM: Access Code: [email or web address] URL: [email or web address] Date: 01/07/2024 Prepared by: [facility] - Outpatient  Rehab - [facility]  Exercises - Standing Balance in Corner with Eyes Closed  - 1 x daily - 7 x weekly - 3 sets - 30 sec hold - Corner Balance Feet Apart: Eyes Open With Head Turns  - 1 x daily - 7 x weekly - 3 sets - 3-5 reps - Corner Balance Feet Together: Eyes Closed With Head Turns  - 1 x daily - 7 x  weekly - 3 sets - 3-5 reps - Heel Toe Raises with Counter Support  - 1 x daily - 7 x weekly - 2-3 sets - 10 reps - 3 sec hold - Side Stepping with Counter Support  - 1 x daily - 7 x weekly - 2 sets - 10 reps - Single Leg Stance with Support  - 1 x daily - 7 x weekly - 1 sets - 3 reps - 10 sec hold - Standing Tandem Balance with Counter Support  - 1 x daily - 7 x weekly - 1 sets - 3 reps - 30 sec hold - Sit to Stand with Armchair  - 1 x daily - 7 x weekly - 5 sets -  5 reps - Mini Squat with Counter Support  - 1 x daily - 5 x weekly - 2 sets - 10 reps - Standing Hip Abduction with Counter Support  - 1 x daily - 5 x weekly - 2 sets - 10 reps - Standing Hip Extension with Counter Support  - 1 x daily - 5 x weekly - 2 sets - 10 reps - Staggered Stance Step Throughs  - 1 x daily - 7 x weekly - 2 sets - 10 reps    PATIENT EDUCATION: Education details: HEP update Person educated: Patient Education method: Explanation, Demonstration, Verbal cues, and Handouts Education comprehension: verbalized understanding, returned demonstration, and needs further education     --------------------------------------------- Note: Objective measures were completed at Evaluation unless otherwise noted.  DIAGNOSTIC FINDINGS:   COGNITION: Overall cognitive status: Within functional limits for tasks assessed   SENSATION: Notes plantar surface of feet is numb, tingling/burning extend mid-calf  EDEMA:  none  MUSCLE TONE:    DTRs:    POSTURE:  rounded shoulders, forward head, and increased thoracic kyphosis  Cervical ROM:    Grossly limited all planes from postural fault  STRENGTH:   LOWER EXTREMITY MMT:   MMT Right eval Left eval  Hip flexion 3+ 3+  Hip abduction 3- 3  Hip adduction 4 4  Hip internal rotation    Hip external rotation    Knee flexion 4 4  Knee extension 4- 4  Ankle dorsiflexion 3+ 4  Ankle plantarflexion    Ankle inversion    Ankle eversion    (Blank rows = not  tested)  BED MOBILITY:  Reports indep  TRANSFERS: Assistive device utilized: None  Sit to stand: Modified independence Stand to sit: Modified independence Chair to chair: Modified independence Floor: NT--reports requires assistance   CURB: CGA  GAIT: Gait pattern: decreased stride length and poor foot clearance- Right Distance walked:  Assistive device utilized: Single point cane Level of assistance: SBA Comments:   FUNCTIONAL TESTS:  5 times sit to stand: 26 sec w/ UE support Berg Balance Scale: 39/56 10 meter walk test: 26 sec = 1.2 ft/sec M-CTSIB  Condition 1: Firm Surface, EO 30 Sec, Mild Sway  Condition 2: Firm Surface, EC 30 Sec, Moderate Sway  Condition 3: Foam Surface, EO 30 Sec, Moderate Sway  Condition 4: Foam Surface, EC 3 Sec, Severe Sway         VESTIBULAR ASSESSMENT:  GENERAL OBSERVATION: not conducted as pt denies any symptoms consistent with concussion  OTHOSTATICS: not done                                                                                                                              GOALS: Goals reviewed with patient? Yes  SHORT TERM GOALS: Target date: 01/14/2024      Patient will be independent in HEP to improve functional outcomes Baseline: cues for sequence/performance Goal status: IN PROGRESS (11/25/23)  2.  Demo improved  BLE strength and reduce risk for falls as evident by 5xSTS test in 20 sec w/out UE  Baseline: 26 sec w/ UE support; 20 sec w/ BUE support; 44 sec w/out UE Goal status: IN PROGRESS (12/24/23)  3.  Demo improved postural stability per mild sway x 30 sec condition 2 M-CTSIB to improve safety with ADL Baseline: moderate sway x 30; mild x 30 Goal status: MET(11/25/23)    LONG TERM GOALS: Target date: 02/04/24    Demo low risk for falls per score 47/56 Berg Balance Test Baseline: 39/56; 47/56 Goal status: MET 12/24/23  2.  Demo improved safety and efficiency with ambulation with adequate right foot  clearance and sustaining speed of 2.8 ft/sec Baseline: 1.2 ft/sec w/ cane Goal status: IN PROGRESS 12/24/23  3.  Demo improved postural stability per mild sway x 30 sec condition 4 M-CTSIB to improve safety with ADL Baseline: severe x 3 sec; mod-severe x 6 sec Goal status: IN PROGRESS (12/24/23)  4.  Improve right hip abduction strength to 4/5 for improved stability/single limb support Baseline: 3-/5; 3/5  Goal status: IN PROGRESS 12/24/23    ASSESSMENT:  CLINICAL IMPRESSION: Progressed LE PRE to increased weight w/ reduced sets to gauge tolerance and response. Able to tolerate consecutive activities w/out need for rest periods and progressed to standing balance activities to facilitate reactive and proactive strategies to elicit righting responses to reduce risk for falls. Difficulty with eyes closed conditions and to retro-postural perturbation requiring UE support to correct. Continued sessions to advance POC details to improve safety with ambulation  EVAL:  Patient is a 78 y.o. lady who was seen today for physical therapy evaluation and treatment for balance and mobility deficits.  Has hx of peripheral neuropathy and recent hx of falling and striking head.  At present, it does not seem she is experiencing any lingering deficits related to concussion/head trauma.  Her chief complaint is balance deficits and LE weakness which is affecting her mobility and increasing risk for falls.  Outcome measures reveal high risk for falls and gait/posture deviations present with poor right foot clearance in swing phase and decreased gait speed/stride length requiring increased double limb support.  Poor proprioception and postural stability evident by assessment.  Pt would benefit from PT sessions to address deficits/limitations to improve safety with functional mobility and reduce risk for falls.    OBJECTIVE IMPAIRMENTS: Abnormal gait, decreased activity tolerance, decreased balance, decreased mobility,  difficulty walking, decreased strength, and postural dysfunction.   ACTIVITY LIMITATIONS: carrying, lifting, bending, squatting, stairs, transfers, reach over head, and locomotion level  PARTICIPATION LIMITATIONS: meal prep, cleaning, laundry, driving, shopping, community activity, and yard work  PERSONAL FACTORS: Age, Time since onset of injury/illness/exacerbation, and 3+ comorbidities: PMH including DM, neuropathy, hx of falls/SDH are also affecting patient's functional outcome.   REHAB POTENTIAL: Good  CLINICAL DECISION MAKING: Evolving/moderate complexity  EVALUATION COMPLEXITY: Moderate   PLAN:  PT FREQUENCY: 1-2x/week  PT DURATION: 6 weeks  PLANNED INTERVENTIONS: 97750- Physical Performance Testing, 97110-Therapeutic exercises, 97530- Therapeutic activity, [id number]- Neuromuscular re-education, 97535- Self Care, [id number]- Manual therapy, [id number]- Gait training, [phone]- Orthotic Initial, [phone]- Orthotic/Prosthetic subsequent, [phone]- Canalith repositioning, [id number]- Aquatic Therapy, and H9716- Electrical stimulation (unattended)  PLAN FOR NEXT SESSION: Review addition to HEP, continue to work on Step and weightshift, obstacle negotiation, forward/back stepping, incline/decline balance   12:41 PM, 01/12/24 M. Kelly Sherry Blackard, PT, DPT Physical Therapist- [place] Office Number: [phone]

## 2024-01-14 ENCOUNTER — Ambulatory Visit: Admitting: Physical Therapy

## 2024-01-14 ENCOUNTER — Encounter: Payer: Self-pay | Admitting: Physical Therapy

## 2024-01-14 DIAGNOSIS — M6281 Muscle weakness (generalized): Secondary | ICD-10-CM

## 2024-01-14 DIAGNOSIS — R2681 Unsteadiness on feet: Secondary | ICD-10-CM

## 2024-01-14 DIAGNOSIS — R262 Difficulty in walking, not elsewhere classified: Secondary | ICD-10-CM

## 2024-01-14 DIAGNOSIS — M5459 Other low back pain: Secondary | ICD-10-CM | POA: Diagnosis not present

## 2024-01-14 DIAGNOSIS — R2689 Other abnormalities of gait and mobility: Secondary | ICD-10-CM

## 2024-01-14 NOTE — Therapy (Signed)
 OUTPATIENT PHYSICAL THERAPY VESTIBULAR TREATMENT NOTE   Patient Name: Samantha Snyder MRN: [id number] DOB:1945/08/21, 78 y.o., female Today's Date: 01/14/2024     END OF SESSION:  PT End of Session - 01/14/24 1144     Visit Number 13    Number of Visits 16    Date for PT Re-Evaluation 02/04/24    Authorization Type [employer]    Authorization Time Period 9 visits approved from 7/2 to 01/28/24    Authorization - Visit Number 12    Authorization - Number of Visits 9    Progress Note Due on Visit 18    PT Start Time 1145    PT Stop Time 1225    PT Time Calculation (min) 40 min    Equipment Utilized During Treatment Gait belt    Activity Tolerance Patient tolerated treatment well    Behavior During Therapy WFL for tasks assessed/performed          Past Medical History:  Diagnosis Date   Allergy Teenager   minor hay fever   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 per x ray    History of UTI 2014   per pt noted after taking Bactrim due to tooth infection   Hyperlipidemia    Neuromuscular disorder (HCC) 2010   neuropathy both feet    Osteopenia    Osteoporosis 2022   Osteoporosis   Right adrenal mass (HCC)    Biopsied in 2008 and benign   Vaginal prolapse    Status post surgery. Improved urinary incontinence   Past Surgical History:  Procedure Laterality Date   ABDOMINAL HYSTERECTOMY  09-07-2013   ABDOMINAL SACROCOLPOPEXY  09/07/2013   BLADDER SURGERY  09/07/2013   vaginal prolapse   BREAST BIOPSY Right    2014? near nipple. benign.   COLONOSCOPY  11/06/2005   sig tics, no polyps-[facility]    CYSTOSCOPY  09/07/2013   ENTEROCELE REPAIR  09/07/2013   INDUCED  ABORTION  1985   Therapeutic   LAPAROSCOPIC BILATERAL SALPINGO OOPHERECTOMY  09/07/2013   POLYPECTOMY     PUBOVAGINAL SLING  09/07/2013   TONSILLECTOMY Bilateral 1955   TUBAL LIGATION  07/31/1984   Patient Active Problem List   Diagnosis Date Noted   History of subdural hematoma 10/05/2021   Emphysema lung (HCC) 12/19/2020   Aortic atherosclerosis (HCC) 07/08/2017   Diabetic peripheral neuropathy (HCC) 10/27/2014   Osteoporosis 03/30/2014   Essential hypertension 03/08/2014   DM (diabetes mellitus) type II controlled, neurological manifestation (HCC) 02/24/2014   Seasonal allergies 02/24/2014   Hyperlipidemia associated with type 2 diabetes mellitus (HCC) 02/24/2014   Tobacco abuse 02/24/2014    PCP: Katrinka Garnette KIDD, MD REFERRING PROVIDER: Katrinka Garnette KIDD, MD  REFERRING DIAG: R26.89 (ICD-10-CM) - Balance disorder Z91.81 (ICD-10-CM) - Personal history of fall E11.42 (ICD-10-CM) - Diabetic peripheral neuropathy  THERAPY DIAG:  Unsteadiness on feet  Muscle weakness (generalized)  Other abnormalities of gait and mobility  Difficulty in walking, not elsewhere classified  Other low back pain  ONSET DATE: 10/11/23  Rationale for Evaluation and Treatment: Rehabilitation  SUBJECTIVE:   SUBJECTIVE STATEMENT: Pt reports the neuropathy  keeps her down but she's been trying to strengthen the muscles. No new issues since last session. No falls.   Pt accompanied by: self  PERTINENT HISTORY: Patient presented to the emergency department on 10/11/2023-she tripped and fell backwards that morning and struck the back of her head- was cleaning out gutters and when moved off ramp nicked corner of it then fell backwards onto concreate on back of head.  No loss of consciousness.  Had posterior scalp pain and mild dizziness. Prior subdural hemorrhage after previous head injury she had a head CT and neck CT and thankfully no acute or traumatic findings were noted with no residual or recurrent  subdural hematomas.  She did have some degenerative disc disease in the cervical spine without fracture. Was told had concussion- did have dizziness  PAIN:  Are you having pain? No  PRECAUTIONS: Fall  RED FLAGS: None   WEIGHT BEARING RESTRICTIONS: No  FALLS: Has patient fallen in last 6 months? Yes. Number of falls 3  LIVING ENVIRONMENT: Lives with: lives with their spouse Lives in: House/apartment Stairs: ramp to enter/exit home Has following equipment at home: Single point cane and Walker - 4 wheeled  PLOF: Independent with basic ADLs, Independent with household mobility without device, and step-in shower w/ grab bars  PATIENT GOALS: improve balance,   OBJECTIVE:  TODAY'S TREATMENT: 01/14/24 Activity Comments  LAQ 3x10 4#  Seated SLR 2x10 4#, foot elevated on 4 box  Slow march 2x10 With one UE assist  One foot step over and back 6 hurdle x10 1 UE support on counter  One foot side step over and back 6 hurdle 2x10 Bilat UE support on counter Knocks over hurdle occasionally with L foot stepping over  One foot step back over pool noodle 2x10 Bilat UE support on counter  Standing hamstring curls 2x10 4#, at counter, cues to keep leg back  Sit to stand 2x5 See above  Sitting on dynadisc: Ant/post pelvic tilt x10 Hip hikes L<>R x10 EC with core tight x30 EC with alternating UE flexion x10   Difficulty hip hiking L For trunk/postural proprioception/balance          HOME EXERCISE PROGRAM: Access Code: [email or web address] URL: [email or web address] Date: 01/07/2024 Prepared by: [facility] - Outpatient  Rehab - [facility]  Exercises - Standing Balance in Corner with Eyes Closed  - 1 x daily - 7 x weekly - 3 sets - 30 sec hold - Corner Balance Feet Apart: Eyes Open With Head Turns  - 1 x daily - 7 x weekly - 3 sets - 3-5 reps - Corner Balance Feet Together: Eyes Closed With Head Turns  - 1 x daily - 7 x weekly - 3 sets - 3-5 reps - Heel Toe Raises with  Counter Support  - 1 x daily - 7 x weekly - 2-3 sets - 10 reps - 3 sec hold - Side Stepping with Counter Support  - 1 x daily - 7 x weekly - 2 sets - 10 reps - Single Leg Stance with Support  - 1 x daily - 7 x weekly - 1 sets - 3 reps - 10 sec hold - Standing Tandem Balance with Counter Support  - 1 x daily - 7 x weekly - 1 sets - 3 reps - 30 sec hold - Sit to Stand with Armchair  - 1 x daily - 7 x weekly - 5 sets - 5 reps - Mini Squat with Counter Support  - 1 x daily -  5 x weekly - 2 sets - 10 reps - Standing Hip Abduction with Counter Support  - 1 x daily - 5 x weekly - 2 sets - 10 reps - Standing Hip Extension with Counter Support  - 1 x daily - 5 x weekly - 2 sets - 10 reps - Staggered Stance Step Throughs  - 1 x daily - 7 x weekly - 2 sets - 10 reps    PATIENT EDUCATION: Education details: HEP update Person educated: Patient Education method: Explanation, Demonstration, Verbal cues, and Handouts Education comprehension: verbalized understanding, returned demonstration, and needs further education     --------------------------------------------- Note: Objective measures were completed at Evaluation unless otherwise noted.   SENSATION: Notes plantar surface of feet is numb, tingling/burning extend mid-calf  POSTURE:  rounded shoulders, forward head, and increased thoracic kyphosis  Cervical ROM:   Grossly limited all planes from postural fault  LOWER EXTREMITY MMT:   MMT Right eval Left eval  Hip flexion 3+ 3+  Hip abduction 3- 3  Hip adduction 4 4  Hip internal rotation    Hip external rotation    Knee flexion 4 4  Knee extension 4- 4  Ankle dorsiflexion 3+ 4  Ankle plantarflexion    Ankle inversion    Ankle eversion    (Blank rows = not tested)  BED MOBILITY:  Reports indep  TRANSFERS: Assistive device utilized: None  Sit to stand: Modified independence Stand to sit: Modified independence Chair to chair: Modified independence Floor: NT--reports  requires assistance   CURB: CGA  GAIT: Gait pattern: decreased stride length and poor foot clearance- Right Distance walked:  Assistive device utilized: Single point cane Level of assistance: SBA Comments:   FUNCTIONAL TESTS:  5 times sit to stand: 26 sec w/ UE support Berg Balance Scale: 39/56 10 meter walk test: 26 sec = 1.2 ft/sec M-CTSIB  Condition 1: Firm Surface, EO 30 Sec, Mild Sway  Condition 2: Firm Surface, EC 30 Sec, Moderate Sway  Condition 3: Foam Surface, EO 30 Sec, Moderate Sway  Condition 4: Foam Surface, EC 3 Sec, Severe Sway         VESTIBULAR ASSESSMENT:  GENERAL OBSERVATION: not conducted as pt denies any symptoms consistent with concussion  OTHOSTATICS: not done                                                                                                                              GOALS: Goals reviewed with patient? Yes  SHORT TERM GOALS: Target date: 01/14/2024      Patient will be independent in HEP to improve functional outcomes Baseline: cues for sequence/performance; has been doing her HEP It makes a difference Goal status: MET (01/14/24)  2.  Demo improved BLE strength and reduce risk for falls as evident by 5xSTS test in 20 sec w/out UE  Baseline: 26 sec w/ UE support; 20 sec w/ BUE support; 44 sec w/out UE; 25 sec with 1 UE  support 22.97 sec with hands on knees Goal status: IN PROGRESS (01/14/24)  3.  Demo improved postural stability per mild sway x 30 sec condition 2 M-CTSIB to improve safety with ADL Baseline: moderate sway x 30; mild x 30 Goal status: MET(11/25/23)    LONG TERM GOALS: Target date: 02/04/24    Demo low risk for falls per score 47/56 Berg Balance Test Baseline: 39/56; 47/56 Goal status: MET 12/24/23  2.  Demo improved safety and efficiency with ambulation with adequate right foot clearance and sustaining speed of 2.8 ft/sec Baseline: 1.2 ft/sec w/ cane Goal status: IN PROGRESS 12/24/23  3.  Demo improved  postural stability per mild sway x 30 sec condition 4 M-CTSIB to improve safety with ADL Baseline: severe x 3 sec; mod-severe x 6 sec Goal status: IN PROGRESS (12/24/23)  4.  Improve right hip abduction strength to 4/5 for improved stability/single limb support Baseline: 3-/5; 3/5  Goal status: IN PROGRESS 12/24/23    ASSESSMENT:  CLINICAL IMPRESSION: Continued LE PRE with 4 lbs.  Continued standing activities encouraging increased R<>L stability/weight shifting and improving stepping (utilizes very diminished steps with amb). Worked on some core/postural strengthening and proprioception today in seated with good stability but demos weaker L hip hike (pt notes that even in seated she tends to list towards one side).   EVAL:  Patient is a 78 y.o. lady who was seen today for physical therapy evaluation and treatment for balance and mobility deficits.  Has hx of peripheral neuropathy and recent hx of falling and striking head.  At present, it does not seem she is experiencing any lingering deficits related to concussion/head trauma.  Her chief complaint is balance deficits and LE weakness which is affecting her mobility and increasing risk for falls.  Outcome measures reveal high risk for falls and gait/posture deviations present with poor right foot clearance in swing phase and decreased gait speed/stride length requiring increased double limb support.  Poor proprioception and postural stability evident by assessment.  Pt would benefit from PT sessions to address deficits/limitations to improve safety with functional mobility and reduce risk for falls.    OBJECTIVE IMPAIRMENTS: Abnormal gait, decreased activity tolerance, decreased balance, decreased mobility, difficulty walking, decreased strength, and postural dysfunction.   ACTIVITY LIMITATIONS: carrying, lifting, bending, squatting, stairs, transfers, reach over head, and locomotion level  PARTICIPATION LIMITATIONS: meal prep, cleaning, laundry,  driving, shopping, community activity, and yard work  PERSONAL FACTORS: Age, Time since onset of injury/illness/exacerbation, and 3+ comorbidities: PMH including DM, neuropathy, hx of falls/SDH are also affecting patient's functional outcome.   REHAB POTENTIAL: Good  CLINICAL DECISION MAKING: Evolving/moderate complexity  EVALUATION COMPLEXITY: Moderate   PLAN:  PT FREQUENCY: 1-2x/week  PT DURATION: 6 weeks  PLANNED INTERVENTIONS: 97750- Physical Performance Testing, 97110-Therapeutic exercises, 97530- Therapeutic activity, [id number]- Neuromuscular re-education, 97535- Self Care, [id number]- Manual therapy, [id number]- Gait training, [phone]- Orthotic Initial, [phone]- Orthotic/Prosthetic subsequent, [phone]- Canalith repositioning, [id number]- Aquatic Therapy, and [phone]- Electrical stimulation (unattended)  PLAN FOR NEXT SESSION: Review addition to HEP, continue to work on Step and weightshift, obstacle negotiation, forward/back stepping, incline/decline balance   11:44 AM, 01/14/24 Kashlynn Kundert April Ma L Sharlie Shreffler, PT, DPT Physical Therapist- [place] Office Number: [phone]

## 2024-01-19 ENCOUNTER — Ambulatory Visit

## 2024-01-19 DIAGNOSIS — R262 Difficulty in walking, not elsewhere classified: Secondary | ICD-10-CM

## 2024-01-19 DIAGNOSIS — M6281 Muscle weakness (generalized): Secondary | ICD-10-CM | POA: Diagnosis not present

## 2024-01-19 DIAGNOSIS — M5459 Other low back pain: Secondary | ICD-10-CM | POA: Diagnosis not present

## 2024-01-19 DIAGNOSIS — R2689 Other abnormalities of gait and mobility: Secondary | ICD-10-CM | POA: Diagnosis not present

## 2024-01-19 DIAGNOSIS — R2681 Unsteadiness on feet: Secondary | ICD-10-CM | POA: Diagnosis not present

## 2024-01-19 NOTE — Therapy (Signed)
 OUTPATIENT PHYSICAL THERAPY VESTIBULAR TREATMENT NOTE   Patient Name: Samantha Snyder MRN: [id number] DOB:01-24-1946, 78 y.o., female Today's Date: 01/19/2024     END OF SESSION:  PT End of Session - 01/19/24 1150     Visit Number 14    Number of Visits 16    Date for PT Re-Evaluation 02/04/24    Authorization Type [employer]    Authorization Time Period 9 visits approved from 7/2 to 01/28/24    Authorization - Number of Visits 9    Progress Note Due on Visit 18    PT Start Time 1145    PT Stop Time 1230    PT Time Calculation (min) 45 min    Equipment Utilized During Treatment Gait belt    Activity Tolerance Patient tolerated treatment well    Behavior During Therapy WFL for tasks assessed/performed          Past Medical History:  Diagnosis Date   Allergy Teenager   minor hay fever   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 per x ray    History of UTI 2014   per pt noted after taking Bactrim due to tooth infection   Hyperlipidemia    Neuromuscular disorder (HCC) 2010   neuropathy both feet    Osteopenia    Osteoporosis 2022   Osteoporosis   Right adrenal mass (HCC)    Biopsied in 2008 and benign   Vaginal prolapse    Status post surgery. Improved urinary incontinence   Past Surgical History:  Procedure Laterality Date   ABDOMINAL HYSTERECTOMY  09-07-2013   ABDOMINAL SACROCOLPOPEXY  09/07/2013   BLADDER SURGERY  09/07/2013   vaginal prolapse   BREAST BIOPSY Right    2014? near nipple. benign.   COLONOSCOPY  11/06/2005   sig tics, no polyps-[facility]    CYSTOSCOPY  09/07/2013   ENTEROCELE REPAIR  09/07/2013   INDUCED ABORTION  1985   Therapeutic    LAPAROSCOPIC BILATERAL SALPINGO OOPHERECTOMY  09/07/2013   POLYPECTOMY     PUBOVAGINAL SLING  09/07/2013   TONSILLECTOMY Bilateral 1955   TUBAL LIGATION  07/31/1984   Patient Active Problem List   Diagnosis Date Noted   History of subdural hematoma 10/05/2021   Emphysema lung (HCC) 12/19/2020   Aortic atherosclerosis (HCC) 07/08/2017   Diabetic peripheral neuropathy (HCC) 10/27/2014   Osteoporosis 03/30/2014   Essential hypertension 03/08/2014   DM (diabetes mellitus) type II controlled, neurological manifestation (HCC) 02/24/2014   Seasonal allergies 02/24/2014   Hyperlipidemia associated with type 2 diabetes mellitus (HCC) 02/24/2014   Tobacco abuse 02/24/2014    PCP: Katrinka Garnette KIDD, MD REFERRING PROVIDER: Katrinka Garnette KIDD, MD  REFERRING DIAG: R26.89 (ICD-10-CM) - Balance disorder Z91.81 (ICD-10-CM) - Personal history of fall E11.42 (ICD-10-CM) - Diabetic peripheral neuropathy  THERAPY DIAG:  No diagnosis found.  ONSET DATE: 10/11/23  Rationale for Evaluation and Treatment: Rehabilitation  SUBJECTIVE:   SUBJECTIVE STATEMENT: The 5# ankle weights bruised the ankles  Pt accompanied by: self  PERTINENT HISTORY: Patient presented to the emergency department on 10/11/2023-she tripped and fell backwards that morning and struck the back of her  head- was cleaning out gutters and when moved off ramp nicked corner of it then fell backwards onto concreate on back of head.  No loss of consciousness.  Had posterior scalp pain and mild dizziness. Prior subdural hemorrhage after previous head injury she had a head CT and neck CT and thankfully no acute or traumatic findings were noted with no residual or recurrent subdural hematomas.  She did have some degenerative disc disease in the cervical spine without fracture. Was told had concussion- did have dizziness  PAIN:  Are you having pain? No  PRECAUTIONS: Fall  RED FLAGS: None   WEIGHT BEARING RESTRICTIONS: No  FALLS: Has  patient fallen in last 6 months? Yes. Number of falls 3  LIVING ENVIRONMENT: Lives with: lives with their spouse Lives in: House/apartment Stairs: ramp to enter/exit home Has following equipment at home: Single point cane and Walker - 4 wheeled  PLOF: Independent with basic ADLs, Independent with household mobility without device, and step-in shower w/ grab bars  PATIENT GOALS: improve balance,   OBJECTIVE:   TODAY'S TREATMENT: 01/19/24 Activity Comments  balance -on firm/foam: catching bean bags and weight shifting, turning, reaching to target -static multisensory -dynamic for single limb support  NU-step level 3 x 9 min Steady state 60-70 SPM for CV endurance                TODAY'S TREATMENT: 01/14/24 Activity Comments  LAQ 3x10 4#  Seated SLR 2x10 4#, foot elevated on 4 box  Slow march 2x10 With one UE assist  One foot step over and back 6 hurdle x10 1 UE support on counter  One foot side step over and back 6 hurdle 2x10 Bilat UE support on counter Knocks over hurdle occasionally with L foot stepping over  One foot step back over pool noodle 2x10 Bilat UE support on counter  Standing hamstring curls 2x10 4#, at counter, cues to keep leg back  Sit to stand 2x5 See above  Sitting on dynadisc: Ant/post pelvic tilt x10 Hip hikes L<>R x10 EC with core tight x30 EC with alternating UE flexion x10   Difficulty hip hiking L For trunk/postural proprioception/balance          HOME EXERCISE PROGRAM: Access Code: [email or web address] URL: [email or web address] Date: 01/07/2024 Prepared by: [facility] - Outpatient  Rehab - [facility]  Exercises - Standing Balance in Corner with Eyes Closed  - 1 x daily - 7 x weekly - 3 sets - 30 sec hold - Corner Balance Feet Apart: Eyes Open With Head Turns  - 1 x daily - 7 x weekly - 3 sets - 3-5 reps - Corner Balance Feet Together: Eyes Closed With Head Turns  - 1 x daily - 7 x weekly - 3 sets - 3-5 reps - Heel Toe Raises  with Counter Support  - 1 x daily - 7 x weekly - 2-3 sets - 10 reps - 3 sec hold - Side Stepping with Counter Support  - 1 x daily - 7 x weekly - 2 sets - 10 reps - Single Leg Stance with Support  - 1 x daily - 7 x weekly - 1 sets - 3 reps - 10 sec hold - Standing Tandem Balance with Counter Support  - 1 x daily - 7 x weekly - 1 sets - 3 reps - 30 sec hold - Sit to Stand with Armchair  - 1 x daily - 7 x weekly - 5 sets - 5 reps - Mini Squat with  Counter Support  - 1 x daily - 5 x weekly - 2 sets - 10 reps - Standing Hip Abduction with Counter Support  - 1 x daily - 5 x weekly - 2 sets - 10 reps - Standing Hip Extension with Counter Support  - 1 x daily - 5 x weekly - 2 sets - 10 reps - Staggered Stance Step Throughs  - 1 x daily - 7 x weekly - 2 sets - 10 reps    PATIENT EDUCATION: Education details: HEP update Person educated: Patient Education method: Explanation, Demonstration, Verbal cues, and Handouts Education comprehension: verbalized understanding, returned demonstration, and needs further education     --------------------------------------------- Note: Objective measures were completed at Evaluation unless otherwise noted.   SENSATION: Notes plantar surface of feet is numb, tingling/burning extend mid-calf  POSTURE:  rounded shoulders, forward head, and increased thoracic kyphosis  Cervical ROM:   Grossly limited all planes from postural fault  LOWER EXTREMITY MMT:   MMT Right eval Left eval  Hip flexion 3+ 3+  Hip abduction 3- 3  Hip adduction 4 4  Hip internal rotation    Hip external rotation    Knee flexion 4 4  Knee extension 4- 4  Ankle dorsiflexion 3+ 4  Ankle plantarflexion    Ankle inversion    Ankle eversion    (Blank rows = not tested)  BED MOBILITY:  Reports indep  TRANSFERS: Assistive device utilized: None  Sit to stand: Modified independence Stand to sit: Modified independence Chair to chair: Modified independence Floor: NT--reports  requires assistance   CURB: CGA  GAIT: Gait pattern: decreased stride length and poor foot clearance- Right Distance walked:  Assistive device utilized: Single point cane Level of assistance: SBA Comments:   FUNCTIONAL TESTS:  5 times sit to stand: 26 sec w/ UE support Berg Balance Scale: 39/56 10 meter walk test: 26 sec = 1.2 ft/sec M-CTSIB  Condition 1: Firm Surface, EO 30 Sec, Mild Sway  Condition 2: Firm Surface, EC 30 Sec, Moderate Sway  Condition 3: Foam Surface, EO 30 Sec, Moderate Sway  Condition 4: Foam Surface, EC 3 Sec, Severe Sway         VESTIBULAR ASSESSMENT:  GENERAL OBSERVATION: not conducted as pt denies any symptoms consistent with concussion  OTHOSTATICS: not done                                                                                                                              GOALS: Goals reviewed with patient? Yes  SHORT TERM GOALS: Target date: 01/14/2024      Patient will be independent in HEP to improve functional outcomes Baseline: cues for sequence/performance; has been doing her HEP It makes a difference Goal status: MET (01/14/24)  2.  Demo improved BLE strength and reduce risk for falls as evident by 5xSTS test in 20 sec w/out UE  Baseline: 26 sec w/ UE support; 20 sec w/ BUE support; 44  sec w/out UE; 25 sec with 1 UE support 22.97 sec with hands on knees Goal status: IN PROGRESS (01/14/24)  3.  Demo improved postural stability per mild sway x 30 sec condition 2 M-CTSIB to improve safety with ADL Baseline: moderate sway x 30; mild x 30 Goal status: MET(11/25/23)    LONG TERM GOALS: Target date: 02/04/24    Demo low risk for falls per score 47/56 Berg Balance Test Baseline: 39/56; 47/56 Goal status: MET 12/24/23  2.  Demo improved safety and efficiency with ambulation with adequate right foot clearance and sustaining speed of 2.8 ft/sec Baseline: 1.2 ft/sec w/ cane Goal status: IN PROGRESS 12/24/23  3.  Demo improved  postural stability per mild sway x 30 sec condition 4 M-CTSIB to improve safety with ADL Baseline: severe x 3 sec; mod-severe x 6 sec Goal status: IN PROGRESS (12/24/23)  4.  Improve right hip abduction strength to 4/5 for improved stability/single limb support Baseline: 3-/5; 3/5  Goal status: IN PROGRESS 12/24/23    ASSESSMENT:  CLINICAL IMPRESSION: Dynamic and static balance challenges to facilitate weight shift and reactive balance strategies demo good static balance with unsupported position and reaching outside BOS all directions. Difficulty with dynamic as single limb support is limited requiring CGA for demands. Endurance to end session for improved activity tolerance  EVAL:  Patient is a 78 y.o. lady who was seen today for physical therapy evaluation and treatment for balance and mobility deficits.  Has hx of peripheral neuropathy and recent hx of falling and striking head.  At present, it does not seem she is experiencing any lingering deficits related to concussion/head trauma.  Her chief complaint is balance deficits and LE weakness which is affecting her mobility and increasing risk for falls.  Outcome measures reveal high risk for falls and gait/posture deviations present with poor right foot clearance in swing phase and decreased gait speed/stride length requiring increased double limb support.  Poor proprioception and postural stability evident by assessment.  Pt would benefit from PT sessions to address deficits/limitations to improve safety with functional mobility and reduce risk for falls.    OBJECTIVE IMPAIRMENTS: Abnormal gait, decreased activity tolerance, decreased balance, decreased mobility, difficulty walking, decreased strength, and postural dysfunction.   ACTIVITY LIMITATIONS: carrying, lifting, bending, squatting, stairs, transfers, reach over head, and locomotion level  PARTICIPATION LIMITATIONS: meal prep, cleaning, laundry, driving, shopping, community activity, and  yard work  PERSONAL FACTORS: Age, Time since onset of injury/illness/exacerbation, and 3+ comorbidities: PMH including DM, neuropathy, hx of falls/SDH are also affecting patient's functional outcome.   REHAB POTENTIAL: Good  CLINICAL DECISION MAKING: Evolving/moderate complexity  EVALUATION COMPLEXITY: Moderate   PLAN:  PT FREQUENCY: 1-2x/week  PT DURATION: 6 weeks  PLANNED INTERVENTIONS: 97750- Physical Performance Testing, 97110-Therapeutic exercises, 97530- Therapeutic activity, [id number]- Neuromuscular re-education, 97535- Self Care, [id number]- Manual therapy, [phone]- Gait training, [phone]- Orthotic Initial, [phone]- Orthotic/Prosthetic subsequent, [phone]- Canalith repositioning, [id number]- Aquatic Therapy, and [phone]- Electrical stimulation (unattended)  PLAN FOR NEXT SESSION: Review addition to HEP, continue to work on Step and weightshift, obstacle negotiation, forward/back stepping, incline/decline balance   11:51 AM, 01/19/24 Jonette MARLA Sandifer, PT, DPT Physical Therapist- [place] Office Number: [phone]

## 2024-01-21 ENCOUNTER — Encounter: Payer: Self-pay | Admitting: Physical Therapy

## 2024-01-21 ENCOUNTER — Ambulatory Visit: Admitting: Physical Therapy

## 2024-01-21 DIAGNOSIS — R2689 Other abnormalities of gait and mobility: Secondary | ICD-10-CM

## 2024-01-21 DIAGNOSIS — M5459 Other low back pain: Secondary | ICD-10-CM | POA: Diagnosis not present

## 2024-01-21 DIAGNOSIS — M6281 Muscle weakness (generalized): Secondary | ICD-10-CM | POA: Diagnosis not present

## 2024-01-21 DIAGNOSIS — R2681 Unsteadiness on feet: Secondary | ICD-10-CM | POA: Diagnosis not present

## 2024-01-21 DIAGNOSIS — R262 Difficulty in walking, not elsewhere classified: Secondary | ICD-10-CM

## 2024-01-21 NOTE — Therapy (Signed)
 OUTPATIENT PHYSICAL THERAPY VESTIBULAR TREATMENT NOTE AND DISCHARGE  PHYSICAL THERAPY DISCHARGE SUMMARY  Visits from Start of Care: 15  Current functional level related to goals / functional outcomes: See below. Pt is feeling stronger, walking without a/d at home   Remaining deficits: See below.    Education / Equipment: Finalized HEP and latest exam findings.    Patient agrees to discharge. Patient goals were partially met. Patient is being discharged due to being pleased with the current functional level.    Patient Name: Samantha Snyder MRN: [id number] DOB:[birth year], 78 y.o., female Today's Date: 01/21/2024     END OF SESSION:  PT End of Session - 01/21/24 1134     Visit Number 15    Number of Visits 16    Date for PT Re-Evaluation 02/04/24    Authorization Type [employer]    Authorization Time Period Auth request for more visits 01/21/24    Authorization - Number of Visits 10    Progress Note Due on Visit 18    PT Start Time 1134    PT Stop Time 1215    PT Time Calculation (min) 41 min    Equipment Utilized During Treatment Gait belt    Activity Tolerance Patient tolerated treatment well    Behavior During Therapy WFL for tasks assessed/performed           Past Medical History:  Diagnosis Date   Allergy Teenager   minor hay fever   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 per x ray    History of UTI 2014   per pt noted after taking Bactrim due to tooth infection   Hyperlipidemia    Neuromuscular disorder (HCC) 2010   neuropathy both feet    Osteopenia    Osteoporosis 2022   Osteoporosis   Right adrenal mass (HCC)    Biopsied in 2008 and benign   Vaginal  prolapse    Status post surgery. Improved urinary incontinence   Past Surgical History:  Procedure Laterality Date   ABDOMINAL HYSTERECTOMY  09-07-2013   ABDOMINAL SACROCOLPOPEXY  09/07/2013   BLADDER SURGERY  09/07/2013   vaginal prolapse   BREAST BIOPSY Right    2014? near nipple. benign.   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/2020   Aortic atherosclerosis (HCC) 07/08/2017   Diabetic peripheral neuropathy (HCC) 10/27/2014   Osteoporosis 03/30/2014   Essential hypertension 03/08/2014   DM (diabetes mellitus) type II controlled, neurological manifestation (HCC) 02/24/2014   Seasonal allergies 02/24/2014   Hyperlipidemia associated with type 2 diabetes mellitus (HCC) 02/24/2014   Tobacco abuse 02/24/2014    PCP: Katrinka Garnette KIDD, MD REFERRING PROVIDER: Katrinka Garnette KIDD, MD  REFERRING DIAG: R26.89 (ICD-10-CM) - Balance disorder Z91.81 (ICD-10-CM) - Personal history of fall E11.42 (ICD-10-CM) - Diabetic peripheral neuropathy  THERAPY DIAG:  Unsteadiness on feet  Muscle weakness (generalized)  Other abnormalities of gait and mobility  Difficulty in walking, not elsewhere classified  Other low back pain  ONSET DATE: 10/11/23  Rationale for Evaluation and Treatment: Rehabilitation  SUBJECTIVE:   SUBJECTIVE STATEMENT: Pt states her husband is getting a heart cath this Friday so had to cancel Monday. Pt reports that she feels ready to d/c today and will perform HEP. Has already done the elliptical this morning. Feels she has improved with the strengthening.    Pt accompanied by: self  PERTINENT HISTORY: Patient presented to the emergency department on 10/11/2023-she tripped and fell backwards that morning and struck the back of her head- was cleaning out gutters and when moved off ramp nicked corner of it then fell backwards onto concreate on back of head.  No loss of consciousness.  Had posterior scalp pain and mild dizziness. Prior subdural hemorrhage after previous head injury she had a head CT and neck CT and thankfully no acute or traumatic findings were noted with no residual or recurrent subdural hematomas.  She did have some degenerative disc disease in the cervical spine without fracture. Was told had concussion- did have dizziness  PAIN:  Are you having pain? No  PRECAUTIONS: Fall  RED FLAGS: None   WEIGHT BEARING RESTRICTIONS: No  FALLS: Has patient fallen in last 6 months? Yes. Number of falls 3  LIVING ENVIRONMENT: Lives with: lives with their spouse Lives in: House/apartment Stairs: ramp to enter/exit home Has following equipment at home: Single point cane and Walker - 4 wheeled  PLOF: Independent with basic ADLs, Independent with household mobility without device, and step-in shower w/ grab bars  PATIENT GOALS: improve balance,   OBJECTIVE:  TODAY'S TREATMENT: 01/21/2024 Activity Comments  Seated LAQ 5# 2x10   Standing Hip abd 5# 2x10 Hamstring curl 5# 2x10 Hip flexion 2x10   Rechecked goals   Staggered stance rock fwd/bwd x10 1 UE assist as needed  Staggered stance step throughs x10 1 UE assist as needed  Gait training SPC 4x40' CGA/SBA; Cues for weight shift, heel strike, step length   TODAY'S TREATMENT: 01/19/24 Activity Comments  balance -on firm/foam: catching bean bags and weight shifting, turning, reaching to target -static multisensory -dynamic for single limb support  NU-step level 3 x 9 min Steady state 60-70 SPM for CV endurance                TODAY'S TREATMENT: 01/14/24 Activity Comments  LAQ  3x10 4#  Seated SLR 2x10 4#, foot elevated on 4 box  Slow march 2x10 With one UE assist  One foot step over and back 6 hurdle x10 1 UE support on counter  One foot side step over and back 6 hurdle 2x10 Bilat UE support on counter Knocks over hurdle occasionally with L foot stepping over  One foot step back over pool noodle 2x10 Bilat UE support on counter  Standing hamstring curls 2x10 4#, at counter, cues to keep leg back  Sit to stand 2x5 See above  Sitting on dynadisc: Ant/post pelvic tilt x10 Hip hikes L<>R x10 EC with core tight x30 EC with alternating UE flexion x10   Difficulty hip hiking L For trunk/postural proprioception/balance          HOME EXERCISE PROGRAM: Access Code: [email or web address] URL: [email or web address] Date: 01/07/2024 Prepared by: [facility] - Outpatient  Rehab - [facility]  Exercises - Standing Balance in Corner with Eyes Closed  - 1 x daily - 7 x weekly - 3 sets - 30 sec hold - Corner Balance Feet Apart: Eyes Open With Head Turns  - 1 x daily - 7 x weekly - 3 sets - 3-5 reps - Corner Balance Feet Together: Eyes Closed With Head Turns  - 1 x daily - 7 x weekly - 3 sets - 3-5 reps - Heel Toe Raises with Counter Support  - 1 x daily - 7 x weekly - 2-3 sets - 10 reps - 3 sec hold - Side Stepping with Counter Support  - 1 x daily - 7 x weekly - 2 sets - 10 reps - Single Leg Stance with Support  - 1 x daily - 7 x weekly - 1 sets - 3 reps - 10 sec hold - Standing Tandem Balance with Counter Support  - 1 x daily - 7 x weekly - 1 sets - 3 reps - 30 sec hold - Sit to Stand with Armchair  - 1 x daily - 7 x weekly - 5 sets - 5 reps - Mini Squat with Counter Support  - 1 x daily - 5 x weekly - 2 sets - 10 reps - Standing Hip Abduction with Counter Support  - 1 x daily - 5 x weekly - 2 sets - 10 reps - Standing Hip Extension with Counter Support  - 1 x daily - 5 x weekly - 2 sets - 10 reps - Staggered Stance Step Throughs  - 1 x daily - 7 x weekly -  2 sets - 10 reps    PATIENT EDUCATION: Education details: HEP update Person educated: Patient Education method: Explanation, Demonstration, Verbal cues, and Handouts Education comprehension: verbalized understanding, returned demonstration, and needs further education     --------------------------------------------- Note: Objective measures were completed at Evaluation unless otherwise noted.   SENSATION: Notes plantar surface of feet is numb, tingling/burning extend mid-calf  POSTURE:  rounded shoulders, forward head, and increased thoracic kyphosis  Cervical ROM:   Grossly limited all planes from postural fault  LOWER EXTREMITY MMT:   MMT Right eval Left eval R/L 01/21/24  Hip flexion 3+ 3+ 3+/3+  Hip abduction 3- 3 4/3+  Hip adduction 4 4   Hip internal rotation     Hip external rotation     Knee flexion 4 4 5/5  Knee extension 4- 4 5/5  Ankle dorsiflexion 3+ 4 5/5  Ankle plantarflexion     Ankle inversion     Ankle eversion     (Blank rows = not tested)  BED MOBILITY:  Reports indep  TRANSFERS: Assistive device utilized: None  Sit to stand: Modified independence Stand to sit: Modified independence Chair to chair: Modified independence Floor: NT--reports requires assistance   CURB: CGA  GAIT: Gait pattern: decreased stride length and poor foot clearance- Right Distance walked:  Assistive device utilized: Single point cane Level of assistance: SBA Comments:   FUNCTIONAL TESTS:  5 times sit to stand: 26 sec w/ UE support Berg Balance Scale: 39/56 10 meter walk test: 26 sec = 1.2 ft/sec M-CTSIB 01/21/24  Condition 1: Firm Surface, EO 30 Sec, Mild Sway 30 sec, no sway  Condition 2: Firm Surface, EC 30 Sec, Moderate Sway 30 sec, mild sway  Condition 3: Foam Surface, EO 30 Sec, Moderate Sway 30 sec, mild sway  Condition 4: Foam Surface, EC 3 Sec, Severe Sway 30 sec,  moderate to severe sway took 2 trials to obtain 30 sec         VESTIBULAR  ASSESSMENT:  GENERAL OBSERVATION: not conducted as pt denies any symptoms consistent with concussion  OTHOSTATICS: not done                                                                                                                              GOALS: Goals reviewed with patient? Yes  SHORT TERM GOALS: Target date: 01/14/2024      Patient will be independent in HEP to improve functional outcomes Baseline: cues for sequence/performance; has been doing her HEP It makes a difference Goal status: MET (01/14/24)  2.  Demo improved BLE strength and reduce risk for falls as evident by 5xSTS test in 20 sec w/out UE  Baseline: 26 sec w/ UE support; 20 sec w/ BUE support; 44 sec w/out UE; 25 sec with 1 UE support 22.97 sec with hands on knees;  Goal status: IN PROGRESS (01/14/24)  3.  Demo improved postural stability per mild sway x 30 sec condition 2 M-CTSIB to improve safety with ADL Baseline: moderate sway x 30; mild x 30 Goal status: MET(11/25/23)    LONG TERM GOALS: Target date: 02/04/24    Demo low risk for falls per score 47/56 Berg Balance Test Baseline: 39/56; 47/56 Goal status: MET 12/24/23  2.  Demo improved safety and efficiency with ambulation with adequate right foot clearance and sustaining speed of 2.8 ft/sec Baseline: 1.2 ft/sec w/ cane 01/21/24: 0.89 ft/sec with cane Goal status: NOT MET   3.  Demo improved postural stability per mild sway x 30 sec condition 4 M-CTSIB to improve safety with ADL Baseline: severe x 3 sec; mod-severe x 6 sec 01/21/24: See mCTSIB above Goal status: MET  4.  Improve right hip abduction strength to 4/5 for improved stability/single limb support Baseline: 3-/5; 3/5 01/21/24: see MMT chart above -- weakest with hip flexion Goal status: PARTIALLY MET    ASSESSMENT:  CLINICAL IMPRESSION: Pt feels ready for PT d/c. Finalized HEP. Increased balance noted and she has met or partially met all of her LTGs except her gait goal. Pt still  has some deficits with hip flexion and her gait. Pt demos decreased R heel strike/step length especially in comparison to L resulting in diminished gait speed. Continued to work on gait training to improve on this issue; by end of session pt able to increase step length and perform good heel strike and weight shift. Pt is motivated to continue her HEP independently and verbalized good understanding of today's findings.  EVAL:  Patient is a 78 y.o. lady who was seen today for physical therapy evaluation and treatment for balance and mobility deficits.  Has hx of peripheral neuropathy and recent hx of falling and striking head.  At present, it does not seem she is experiencing any lingering deficits related to concussion/head trauma.  Her chief complaint is balance deficits  and LE weakness which is affecting her mobility and increasing risk for falls.  Outcome measures reveal high risk for falls and gait/posture deviations present with poor right foot clearance in swing phase and decreased gait speed/stride length requiring increased double limb support.  Poor proprioception and postural stability evident by assessment.  Pt would benefit from PT sessions to address deficits/limitations to improve safety with functional mobility and reduce risk for falls.    OBJECTIVE IMPAIRMENTS: Abnormal gait, decreased activity tolerance, decreased balance, decreased mobility, difficulty walking, decreased strength, and postural dysfunction.   ACTIVITY LIMITATIONS: carrying, lifting, bending, squatting, stairs, transfers, reach over head, and locomotion level  PARTICIPATION LIMITATIONS: meal prep, cleaning, laundry, driving, shopping, community activity, and yard work  PERSONAL FACTORS: Age, Time since onset of injury/illness/exacerbation, and 3+ comorbidities: PMH including DM, neuropathy, hx of falls/SDH are also affecting patient's functional outcome.   REHAB POTENTIAL: Good  CLINICAL DECISION MAKING:  Evolving/moderate complexity  EVALUATION COMPLEXITY: Moderate   PLAN:  PT FREQUENCY: 1-2x/week  PT DURATION: 6 weeks  PLANNED INTERVENTIONS: 97750- Physical Performance Testing, 97110-Therapeutic exercises, 97530- Therapeutic activity, [id number]- Neuromuscular re-education, 97535- Self Care, [id number]- Manual therapy, [id number]- Gait training, [phone]- Orthotic Initial, [phone]- Orthotic/Prosthetic subsequent, [phone]- Canalith repositioning, [id number]- Aquatic Therapy, and [phone]- Electrical stimulation (unattended)  PLAN FOR NEXT SESSION: Review addition to HEP, continue to work on Step and weightshift, obstacle negotiation, forward/back stepping, incline/decline balance   11:34 AM, 01/21/24 Ronn Smolinsky April Ma L Yuvonne Lanahan, PT, DPT Physical Therapist- [place] Office Number: [phone]

## 2024-01-26 ENCOUNTER — Ambulatory Visit

## 2024-01-28 ENCOUNTER — Ambulatory Visit

## 2024-02-20 DIAGNOSIS — L918 Other hypertrophic disorders of the skin: Secondary | ICD-10-CM | POA: Diagnosis not present

## 2024-02-20 DIAGNOSIS — L814 Other melanin hyperpigmentation: Secondary | ICD-10-CM | POA: Diagnosis not present

## 2024-02-20 DIAGNOSIS — L578 Other skin changes due to chronic exposure to nonionizing radiation: Secondary | ICD-10-CM | POA: Diagnosis not present

## 2024-02-20 DIAGNOSIS — L821 Other seborrheic keratosis: Secondary | ICD-10-CM | POA: Diagnosis not present

## 2024-02-20 DIAGNOSIS — D1801 Hemangioma of skin and subcutaneous tissue: Secondary | ICD-10-CM | POA: Diagnosis not present

## 2024-02-20 DIAGNOSIS — D2271 Melanocytic nevi of right lower limb, including hip: Secondary | ICD-10-CM | POA: Diagnosis not present

## 2024-02-22 ENCOUNTER — Other Ambulatory Visit: Payer: Self-pay | Admitting: Family Medicine

## 2024-03-02 ENCOUNTER — Encounter: Payer: Self-pay | Admitting: Family Medicine

## 2024-03-02 ENCOUNTER — Ambulatory Visit: Admitting: Family Medicine

## 2024-03-02 VITALS — BP 128/78 | HR 73 | Temp 97.3°F | Ht 68.0 in | Wt 185.0 lb

## 2024-03-02 DIAGNOSIS — M810 Age-related osteoporosis without current pathological fracture: Secondary | ICD-10-CM | POA: Diagnosis not present

## 2024-03-02 DIAGNOSIS — I10 Essential (primary) hypertension: Secondary | ICD-10-CM

## 2024-03-02 DIAGNOSIS — E1169 Type 2 diabetes mellitus with other specified complication: Secondary | ICD-10-CM

## 2024-03-02 DIAGNOSIS — E1142 Type 2 diabetes mellitus with diabetic polyneuropathy: Secondary | ICD-10-CM | POA: Diagnosis not present

## 2024-03-02 DIAGNOSIS — E785 Hyperlipidemia, unspecified: Secondary | ICD-10-CM | POA: Diagnosis not present

## 2024-03-02 DIAGNOSIS — Z7984 Long term (current) use of oral hypoglycemic drugs: Secondary | ICD-10-CM | POA: Diagnosis not present

## 2024-03-02 DIAGNOSIS — Z720 Tobacco use: Secondary | ICD-10-CM

## 2024-03-02 LAB — COMPREHENSIVE METABOLIC PANEL WITH GFR
ALT: 11 U/L (ref 0–35)
AST: 11 U/L (ref 0–37)
Albumin: 4 g/dL (ref 3.5–5.2)
Alkaline Phosphatase: 69 U/L (ref 39–117)
BUN: 17 mg/dL (ref 6–23)
CO2: 28 meq/L (ref 19–32)
Calcium: 9.4 mg/dL (ref 8.4–10.5)
Chloride: 107 meq/L (ref 96–112)
Creatinine, Ser: 0.52 mg/dL (ref 0.40–1.20)
GFR: 90.16 mL/min (ref >60.00)
Glucose, Bld: 107 mg/dL — ABNORMAL HIGH (ref 70–99)
Potassium: 4.3 meq/L (ref 3.5–5.1)
Sodium: 144 meq/L (ref 135–145)
Total Bilirubin: 0.4 mg/dL (ref 0.2–1.2)
Total Protein: 6 g/dL (ref 6.0–8.3)

## 2024-03-02 LAB — CBC WITH DIFFERENTIAL/PLATELET
Basophils Absolute: 0.1 K/uL (ref 0.0–0.1)
Basophils Relative: 0.6 % (ref 0.0–3.0)
Eosinophils Absolute: 0.1 K/uL (ref 0.0–0.7)
Eosinophils Relative: 1 % (ref 0.0–5.0)
HCT: 43.3 % (ref 36.0–46.0)
Hemoglobin: 14.4 g/dL (ref 12.0–15.0)
Lymphocytes Relative: 14.4 % (ref 12.0–46.0)
Lymphs Abs: 1.4 K/uL (ref 0.7–4.0)
MCHC: 33.3 g/dL (ref 30.0–36.0)
MCV: 92.1 fl (ref 78.0–100.0)
Monocytes Absolute: 0.6 K/uL (ref 0.1–1.0)
Monocytes Relative: 6.4 % (ref 3.0–12.0)
Neutro Abs: 7.3 K/uL (ref 1.4–7.7)
Neutrophils Relative %: 77.6 % — ABNORMAL HIGH (ref 43.0–77.0)
Platelets: 335 K/uL (ref 150.0–400.0)
RBC: 4.7 Mil/uL (ref 3.87–5.11)
RDW: 14.6 % (ref 11.5–15.5)
WBC: 9.4 K/uL (ref 4.0–10.5)

## 2024-03-02 MED ORDER — DROPSAFE ALCOHOL PREP 70 % PADS: MEDICATED_PAD | 3 refills | Status: AC

## 2024-03-02 NOTE — Progress Notes (Signed)
 Phone [phone] In person visit   Subjective:   Samantha Snyder is a 78 y.o. year old very pleasant female patient who presents for/with See problem oriented charting Chief Complaint  Patient presents with   Diabetes   Hypertension    My team discussed the use of AI scribe software for clinical note transcription with the patient, who gave verbal consent to proceed.  History of Present Illness   Samantha Snyder is a 78 year old female who presents for a follow-up visit for diabetes management.  Her diabetes is managed with metformin  1000 mg twice daily and Ozempic  0.5 mg weekly. Her most recent A1c is 6.6. She has experienced a weight loss of six pounds since her last visit.  Her hypertension is controlled with lisinopril  5 mg daily, and her hyperlipidemia is managed with atorvastatin  20 mg daily. She is not on aspirin due to a history of subdural hematoma.  She has osteoporosis and takes Fosamax  70 mg weekly. Her most recent bone density test was in 2024, conducted on a different machine than previous tests.  She experiences diabetic neuropathy, contributing to balance issues. She completed physical therapy on July 30th. For pain management, she uses Voltaren  gel on her feet, knees, and hands. She is not taking alpha lipoic acid as her insurance no longer covers it and she did not notice a difference when she stopped it.  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  She experienced a fall in April while cleaning gutters, resulting in a head injury that required EMS and two CT scans.  She has stopped cleaning the gutters thankfully  She smokes and has a history of slight polycythemia.  Strongly encouraged smoking cessation  She has a history of a rash from Band-Aid use after skin tag removal, which she manages with hydrocortisone cream.  This is improving       Past  Medical History-  Patient Active Problem List   Diagnosis Date Noted   History of subdural hematoma 10/05/2021    Priority: High   DM (diabetes mellitus) type II controlled, neurological manifestation (HCC) 02/24/2014    Priority: High   Tobacco abuse 02/24/2014    Priority: High   Aortic atherosclerosis (HCC) 07/08/2017    Priority: Medium    Osteoporosis 03/30/2014    Priority: Medium    Essential hypertension 03/08/2014    Priority: Medium    Hyperlipidemia associated with type 2 diabetes mellitus (HCC) 02/24/2014    Priority: Medium    Emphysema lung (HCC) 12/19/2020    Priority: Low   Diabetic peripheral neuropathy (HCC) 10/27/2014    Priority: Low   Seasonal allergies 02/24/2014    Priority: Low    Medications- reviewed and updated Current Outpatient Medications  Medication Sig Dispense Refill   acetaminophen  (TYLENOL ) 500 MG tablet Take 500 mg by mouth in the morning and at bedtime.     alendronate  (FOSAMAX ) 70 MG tablet TAKE 1 TABLET BY MOUTH EVERY 7 DAYS. TAKE WITH A FULL GLASS OF WATER ON AN EMPTY STOMACH. 12 tablet 3   atorvastatin  (LIPITOR) 20 MG tablet TAKE 1 TABLET EVERY DAY 90 tablet 3   Blood Glucose Monitoring Suppl (TRUE METRIX AIR GLUCOSE METER) w/Device KIT USE TO TEST BLOOD SUGARS DAILY 1 kit 3   Calcium  Carbonate (CALCIUM  600 PO) Take 1,200 mg by mouth at bedtime.     Cholecalciferol (  VITAMIN D3) 2000 UNITS TABS Take 2,000 Units by mouth at bedtime.     diclofenac  Sodium (VOLTAREN ) 1 % GEL APPLY 2 GRAMS TOPICALLY TO PAINFUL SITES OF FEET AND HANDS 4 TIMES A DAY AS NEEDED 400 g 5   fexofenadine (ALLEGRA) 180 MG tablet Take 180 mg by mouth daily.     lisinopril  (ZESTRIL ) 5 MG tablet TAKE 1 TABLET EVERY DAY 90 tablet 3   Magnesium 500 MG CAPS Take 500 mg by mouth at bedtime.     metFORMIN  (GLUCOPHAGE ) 1000 MG tablet TAKE 1 TABLET TWICE DAILY WITH MEALS 180 tablet 3   Multiple Vitamins-Minerals (HAIR SKIN AND NAILS FORMULA PO) Take 2 tablets by mouth daily.      Multiple Vitamins-Minerals (MULTIVITAMIN PO) Take 1 tablet by mouth at bedtime.     Multiple Vitamins-Minerals (PRESERVISION AREDS 2) CAPS Take by mouth. For macular degeneration twice daily     Omega-3 Fatty Acids (FISH OIL PO) Take 2,000 mg by mouth in the morning and at bedtime.     Probiotic Product (PROBIOTIC DAILY PO) Take 1 capsule by mouth every evening.     Semaglutide ,0.25 or 0.5MG /DOS, (OZEMPIC , 0.25 OR 0.5 MG/DOSE,) 2 MG/3ML SOPN INJECT 0.5MG  AS DIRECTED ONCE WEEKLY 9 mL 3   TRUE METRIX BLOOD GLUCOSE TEST test strip TEST BLOOD SUGAR UP TO FOUR TIMES DAILY 350 strip 3   TRUEplus Lancets 33G MISC TEST BLOOD SUGAR UP TO FOUR TIMES DAILY 400 each 3   Alcohol  Swabs  (DROPSAFE ALCOHOL  PREP) 70 % PADS USE TO CLEANSE ARE BEFORE CHECKING BLOOD SUGAR. 400 each 3   No current facility-administered medications for this visit.     Objective:  BP 128/78 (BP Location: Left Arm, Patient Position: Sitting, Cuff Size: Normal)   Pulse 73   Temp (!) 97.3 F (36.3 C) (Temporal)   Ht 5' 8 (1.727 m)   Wt 185 lb (83.9 kg)   LMP  (LMP Unknown)   SpO2 93%   BMI 28.13 kg/m  Gen: NAD, resting comfortably CV: RRR no murmurs rubs or gallops Lungs: CTAB no crackles, wheeze, rhonchi Ext: trace edema under compression Skin: warm, dry Walking with Cane     Assessment and Plan    #Health maintenance- plans on flu shot later in September with husband    #Type 2 diabetes mellitus is well-controlled with an A1c of 6.6. Diabetic neuropathy contributes to balance issues. Alpha lipoic acid discontinued due to lack of insurance coverage and perceived benefit. - Continue metformin  1000 mg twice daily - Continue Ozempic  0.5 mg weekly - Continue using Voltaren  gel for symptomatic relief - Check A1c, blood counts, kidney, liver, and electrolytes  # Balance issues are present due to diabetic neuropathy. Physical therapy was completed on July 30th. Exercises with weights are being performed at home. -  Encourage continuation of home exercises with weights  #Hypertension is well-managed with lisinopril  5 mg. Lisinopril  also provides renal protection from diabetes-related damage. - Continue lisinopril  5 mg daily  #Hyperlipidemia is well-controlled with atorvastatin  20 mg daily. - Continue atorvastatin  20 mg daily  #Osteoporosis is managed with Fosamax  70 mg weekly, calcium , and vitamin D . No recent falls reported since April. - Continue Fosamax  70 mg weekly - Continue calcium  and vitamin D  supplementation -Next bone density 2026  # Nicotine dependence, cigarettes She is a smoker. Strong encouragement for smoking cessation was provided. 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 Recheck was planned for today with CV see. - Recheck polycythemia  today  #Contact dermatitis developed from Band-Aid use, likely due to tape sensitivity. Hydrocortisone cream is being used for treatment. - Continue using hydrocortisone cream for dermatitis.  Thankfully this is improving-follow-up if fails to continue to improve      Recommended follow up: Return in about 6 months (around 08/30/2024) for physical or sooner if needed.Schedule b4 you leave. Future Appointments  Date Time Provider Department Center  03/15/2024  8:40 AM [place], PA-C [facility]  04/19/2024  2:20 PM [facility] ANNUAL WELLNESS VISIT 1 [facility] [place]    Lab/Order associations:   ICD-10-CM   1. Controlled type 2 diabetes mellitus with diabetic polyneuropathy, without long-term current use of insulin  (HCC)  E11.42 Hemoglobin A1c    Comprehensive metabolic panel with GFR    CBC with Differential/Platelet    2. Tobacco abuse  Z72.0     3. Hyperlipidemia associated with type 2 diabetes mellitus (HCC)  E11.69 Comprehensive metabolic panel with GFR   E78.5 CBC with Differential/Platelet    4. Essential hypertension  I10     5. Age-related osteoporosis without current  pathological fracture  M81.0     6. Diabetic peripheral neuropathy (HCC)  E11.42       Meds ordered this encounter  Medications   Alcohol  Swabs  (DROPSAFE ALCOHOL  PREP) 70 % PADS    Sig: USE TO CLEANSE ARE BEFORE CHECKING BLOOD SUGAR.    Dispense:  400 each    Refill:  3    Return precautions advised.  Garnette Lukes, MD

## 2024-03-02 NOTE — Patient Instructions (Addendum)
 Please stop by lab before you go If you have mychart- we will send your results within 3 business days of us  receiving them.  If you do not have mychart- we will call you about results within 5 business days of us  receiving them.  *please also note that you will see labs on mychart as soon as they post. I will later go in and write notes on them- will say notes from Dr. Katrinka   One of the most important needs you can do for long-term health is to quit smoking-strongly encourage as  Recommended follow up: Return in about 6 months (around 08/30/2024) for physical or sooner if needed.Schedule b4 you leave.

## 2024-03-03 ENCOUNTER — Ambulatory Visit: Payer: Self-pay | Admitting: Family Medicine

## 2024-03-03 ENCOUNTER — Encounter: Payer: Self-pay | Admitting: Family Medicine

## 2024-03-03 LAB — HEMOGLOBIN A1C
Hgb A1c MFr Bld: 6 % — ABNORMAL HIGH (ref <5.7)
Mean Plasma Glucose: 126 mg/dL
eAG (mmol/L): 7 mmol/L

## 2024-03-14 IMAGING — CT CT HEAD W/O CM
4 series · 16 of 47 positions shown, 18 images · non-contrast
Comparison: Brain MRI 10/16/2021 and earlier.

CLINICAL DATA: 76-year-old female status post fall and subdural
hematoma in [place]. Continued pressure/headache on the left.



[Series 2: head 5.00 hr40 s3 axial ibhc · axial · 0.45mm/px · z∈[-646,-521]mm · 7 of 35 slices shown, 9 images]
[im 5/35  brain]
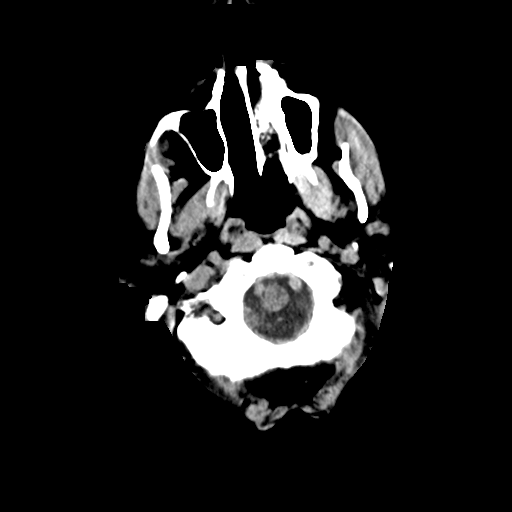
[im 5/35  bone]
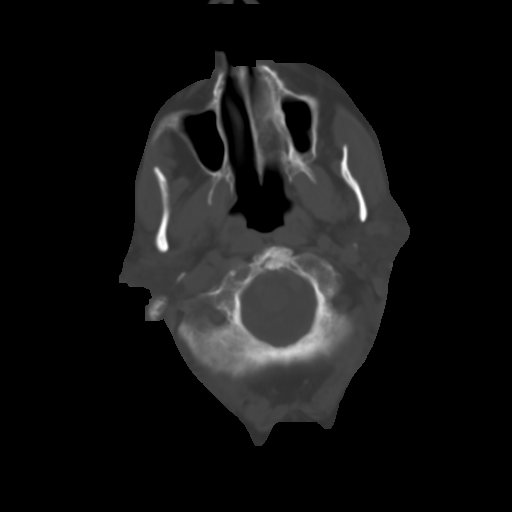
[im 9/35  brain]
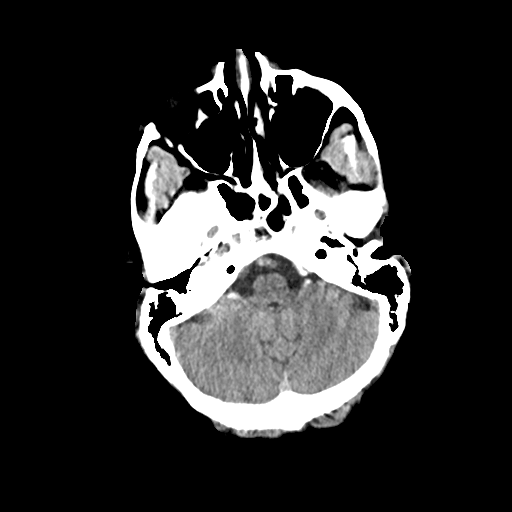
[im 13/35  brain]
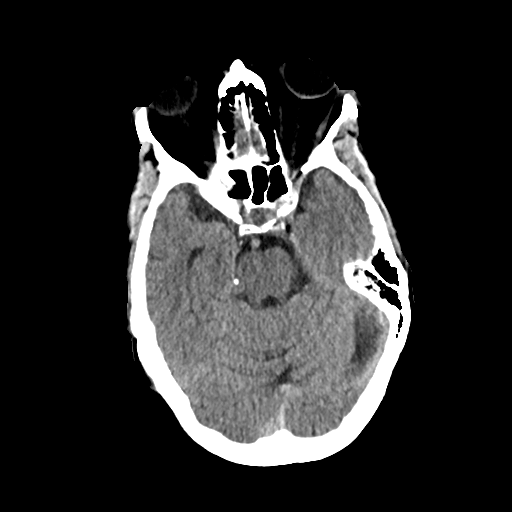
[im 18/35  brain]
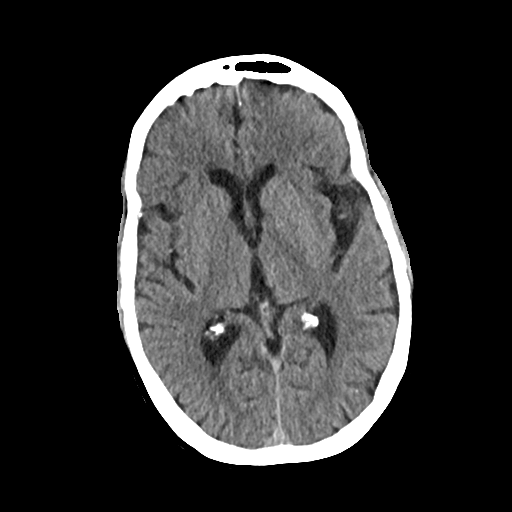
[im 22/35  brain]
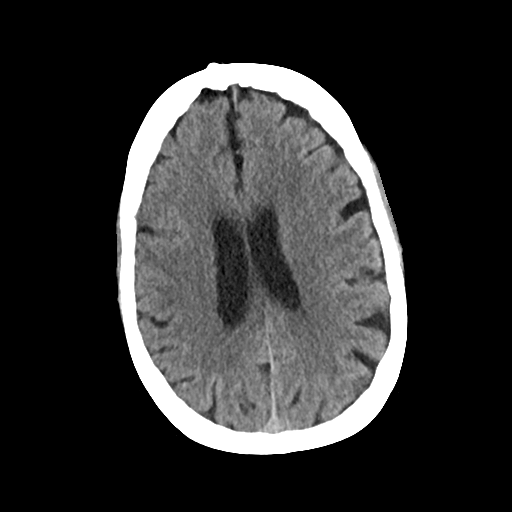
[im 22/35  bone]
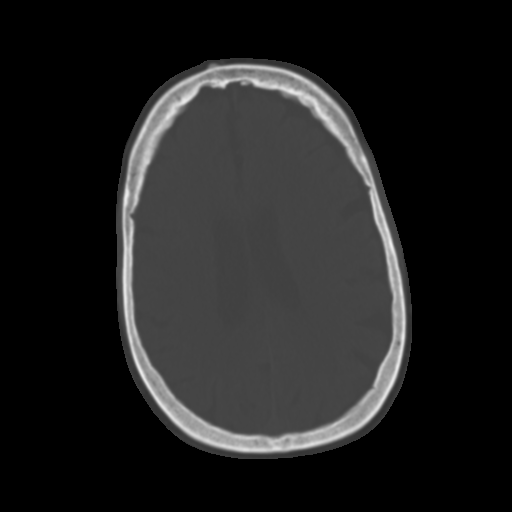
[im 26/35  brain]
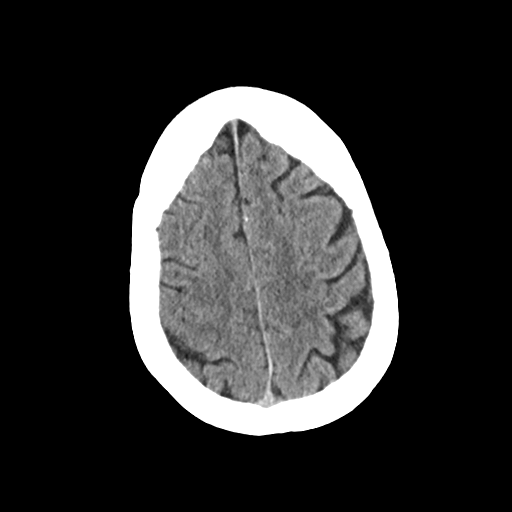
[im 30/35  brain]
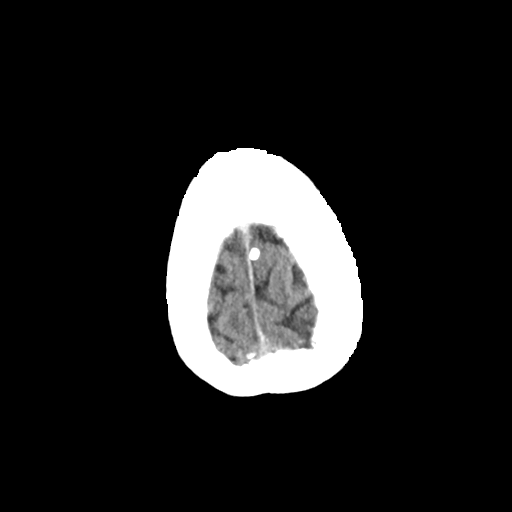

[Series 3: head 2.00 hr60 s3 axial bone · axial · 0.45mm/px · z∈[-652,-616]mm · 3 of 88 slices shown]
[im 9/88  bone]
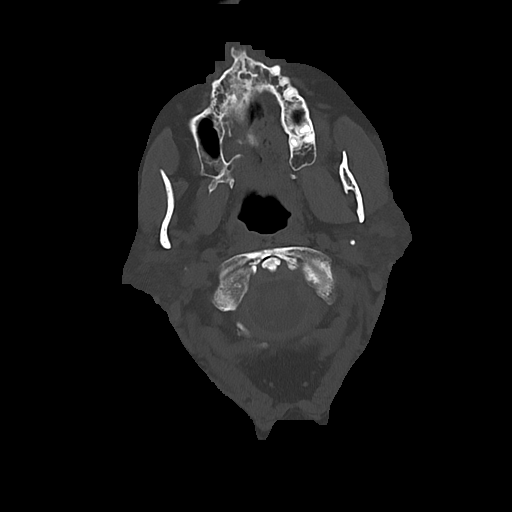
[im 18/88  bone]
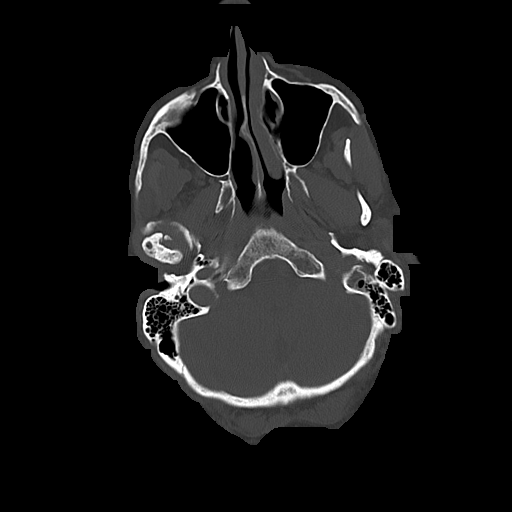
[im 27/88  bone]
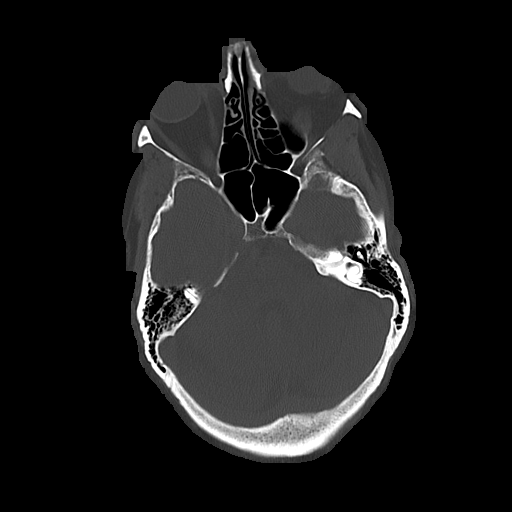

[Series 4: head 3.00 hr40 s3 sag · sagittal · 0.35mm/px · 3 of 63 slices shown]
[im 21/63  brain]
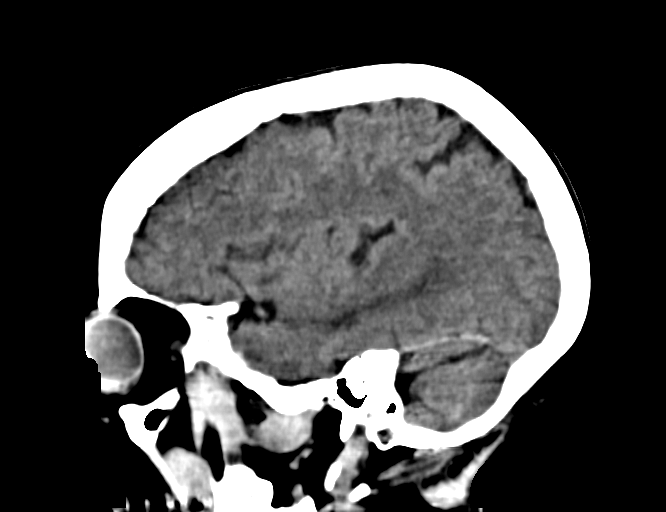
[im 32/63  brain]
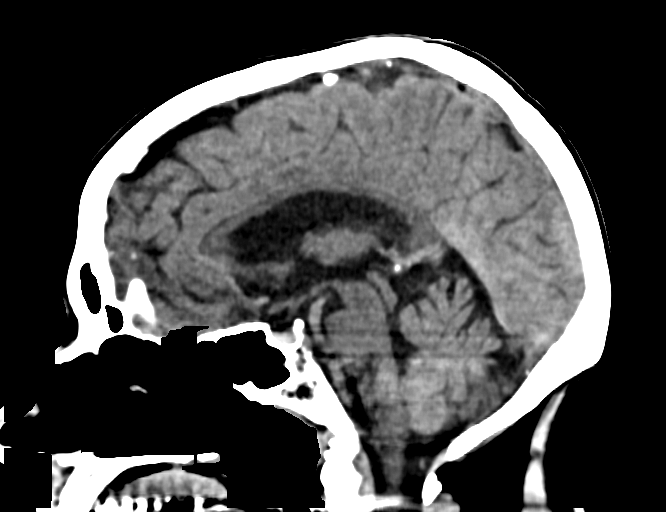
[im 42/63  brain]
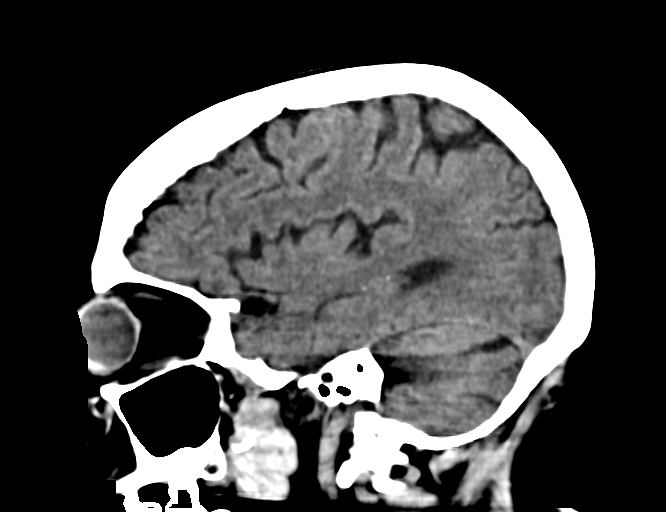

[Series 6: head 3.00 hr40 s3 cor · coronal · 0.35mm/px · 3 of 76 slices shown]
[im 26/76  brain]
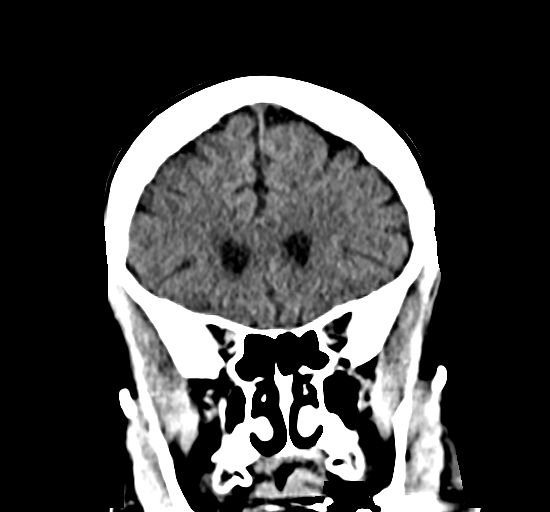
[im 34/76  brain]
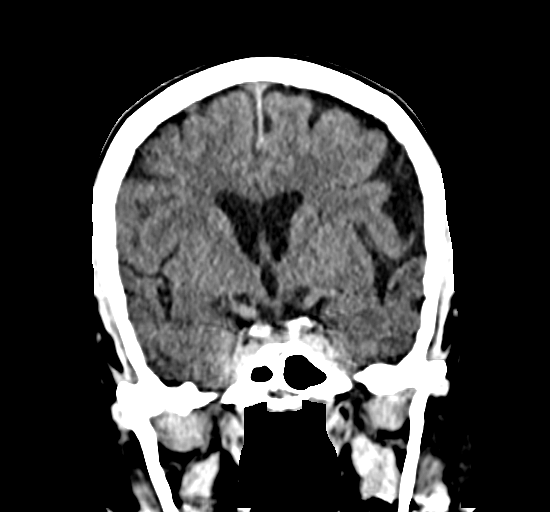
[im 42/76  brain]
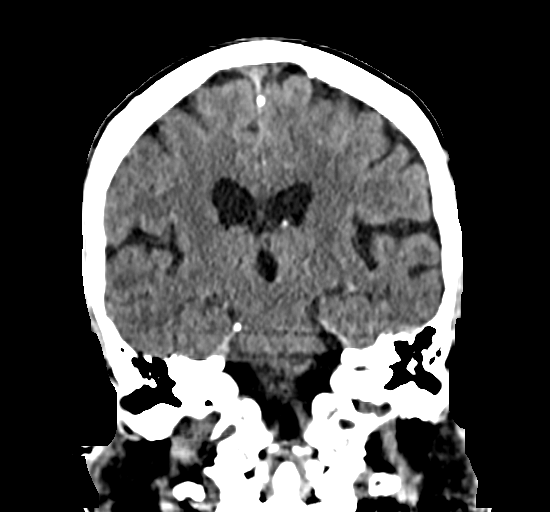

[16 of 47 positions shown; findings below may reference images not displayed]

FINDINGS: Brain: Right para falcine and peripheral subdural hematoma appears
resolved. No convincing residual blood products.

Stable cerebral volume. No midline shift, ventriculomegaly, mass
effect, evidence of mass lesion, new intracranial hemorrhage or
evidence of cortically based acute infarction. Stable gray-white
matter differentiation, within normal limits for age.

Vascular: Mild Calcified atherosclerosis at the skull base.

Skull: Stable and intact.  No acute osseous abnormality identified.

Sinuses/Orbits: Visualized paranasal sinuses and mastoids are stable
and well aerated.

Other: Visualized orbits and scalp soft tissues are within normal
limits.
IMPRESSION: Resolved right side subdural hematoma since [place]. No new
intracranial abnormality.

## 2024-03-14 NOTE — Progress Notes (Unsigned)
 Ellouise Console, PA-C [address] [place], [place]  [address] Phone: [phone]   Gastroenterology Consultation  Referring Provider:     Katrinka Garnette KIDD, MD Primary Care Physician:  Katrinka Garnette KIDD, MD Primary Gastroenterologist:  Ellouise Console, PA-C / Dr. Gordy Starch  Reason for Consultation:     Discuss colonoscopy        HPI:   Samantha Snyder is a 78 y.o. y/o female referred for consultation & management  by Katrinka Garnette KIDD, MD. She has history of adenomatous colon polyps and is here to discuss scheduling 5-year repeat colonoscopy.  Current Symptoms: Patient denies abdominal pain, heartburn, dysphagia, or rectal bleeding.  She has mild bowel irregularities with occasional diarrhea alternating with constipation.  This improves with high-fiber diet.  Her breathing is doing well.  She is not on any oxygen or inhalers.  She denies concerns today.  She is ready to schedule a repeat colonoscopy.  She walks with a walker.  She completed physical therapy a few months ago to help with balance issues.  01/21/2019 last colonoscopy by Dr. Starch: 2 small tubular adenoma polyps (4 mm to 5 mm) removed.  1 small 3 mm colon mucosal polyp removed.  Diverticulosis.  Good prep with 2-day MiraLAX  plus Suprep.  11/2015 colonoscopy: 4 ( 4mm to 6mm) tubular adenoma polyps removed.  Required 2 day Prep with Miralax  and SuPrep.   PMH: Diabetes type II, emphysema, COPD, tobacco dependence, hypertension diabetic peripheral neuropathy, balance disorder, aortic atherosclerosis, history of subdural hematoma (09/2021).  No current blood thinners.  Past Medical History:  Diagnosis Date   Allergy Teenager   minor hay fever   Arthritis 2020   hands x 2, knee    Cataract 2022   small   Chicken pox 02/24/2014   Has had shingles vaccine   Chronic diarrhea    For one year-needed prolonged course of antibiotics   Diabetes mellitus without complication (HCC) 06/06/2003   Type 2   Diverticulosis  02/24/2014   Emphysema of lung (HCC) 2023   mild per pt. per x ray    History of UTI 2014   per pt noted after taking Bactrim due to tooth infection   Hyperlipidemia    Neuromuscular disorder (HCC) 2010   neuropathy both feet    Osteopenia    Osteoporosis 2022   Osteoporosis   Right adrenal mass (HCC)    Biopsied in 2008 and benign   Vaginal prolapse    Status post surgery. Improved urinary incontinence    Past Surgical History:  Procedure Laterality Date   ABDOMINAL HYSTERECTOMY  09-07-2013   ABDOMINAL SACROCOLPOPEXY  09/07/2013   BLADDER SURGERY  09/07/2013   vaginal prolapse   BREAST BIOPSY Right    2014? near nipple. benign.   COLONOSCOPY  11/06/2005   sig tics, no polyps-[facility]    CYSTOSCOPY  09/07/2013   ENTEROCELE REPAIR  09/07/2013   INDUCED ABORTION  1985   Therapeutic   LAPAROSCOPIC BILATERAL SALPINGO OOPHERECTOMY  09/07/2013   POLYPECTOMY     PUBOVAGINAL SLING  09/07/2013   TONSILLECTOMY Bilateral 1955   TUBAL LIGATION  07/31/1984    Prior to Admission medications   Medication Sig Start Date End Date Taking? Authorizing Provider  acetaminophen  (TYLENOL ) 500 MG tablet Take 500 mg by mouth in the morning and at bedtime.    [provider]  Alcohol  Swabs  (DROPSAFE ALCOHOL  PREP) 70 % PADS USE TO CLEANSE ARE BEFORE CHECKING  BLOOD SUGAR. 03/02/24   Katrinka Garnette KIDD, MD  alendronate  (FOSAMAX ) 70 MG tablet TAKE 1 TABLET BY MOUTH EVERY 7 DAYS. TAKE WITH A FULL GLASS OF WATER ON AN EMPTY STOMACH. 07/17/23   Katrinka Garnette KIDD, MD  atorvastatin  (LIPITOR) 20 MG tablet TAKE 1 TABLET EVERY DAY 11/10/23   Katrinka Garnette KIDD, MD  Blood Glucose Monitoring Suppl (TRUE METRIX AIR GLUCOSE METER) w/Device KIT USE TO TEST BLOOD SUGARS DAILY 08/29/22   Katrinka Garnette KIDD, MD  Calcium  Carbonate (CALCIUM  600 PO) Take 1,200 mg by mouth at bedtime.    [provider]  Cholecalciferol (VITAMIN D3) 2000 UNITS TABS Take 2,000 Units by mouth at bedtime.    [provider]  diclofenac  Sodium (VOLTAREN ) 1 % GEL APPLY 2 GRAMS TOPICALLY TO PAINFUL SITES OF FEET AND HANDS 4 TIMES A DAY AS NEEDED 07/21/23   Katrinka Garnette KIDD, MD  fexofenadine (ALLEGRA) 180 MG tablet Take 180 mg by mouth daily.    [provider]  lisinopril  (ZESTRIL ) 5 MG tablet TAKE 1 TABLET EVERY DAY 11/10/23   Katrinka Garnette KIDD, MD  Magnesium 500 MG CAPS Take 500 mg by mouth at bedtime.    [provider]  metFORMIN  (GLUCOPHAGE ) 1000 MG tablet TAKE 1 TABLET TWICE DAILY WITH MEALS 11/10/23   Katrinka Garnette KIDD, MD  Multiple Vitamins-Minerals (HAIR SKIN AND NAILS FORMULA PO) Take 2 tablets by mouth daily.    [provider]  Multiple Vitamins-Minerals (MULTIVITAMIN PO) Take 1 tablet by mouth at bedtime.    [provider]  Multiple Vitamins-Minerals (PRESERVISION AREDS 2) CAPS Take by mouth. For macular degeneration twice daily    [provider]  Omega-3 Fatty Acids (FISH OIL PO) Take 2,000 mg by mouth in the morning and at bedtime.    [provider]  Probiotic Product (PROBIOTIC DAILY PO) Take 1 capsule by mouth every evening.    [provider]  Semaglutide ,0.25 or 0.5MG /DOS, (OZEMPIC , 0.25 OR 0.5 MG/DOSE,) 2 MG/3ML SOPN INJECT 0.5MG  AS DIRECTED ONCE WEEKLY 08/11/23   Katrinka Garnette KIDD, MD  TRUE METRIX BLOOD GLUCOSE TEST test strip TEST BLOOD SUGAR UP TO FOUR TIMES DAILY 05/01/23   Katrinka Garnette KIDD, MD  TRUEplus Lancets 33G MISC TEST BLOOD SUGAR UP TO FOUR TIMES DAILY 02/24/24   Katrinka Garnette KIDD, MD    Family History  Problem Relation Age of Onset   Osteoporosis Mother    Anuerysm Mother    Hearing loss Mother    Cancer Father        Lung but was a smoker   Stroke Father        70 massive lead to death   Diabetes Father    Bladder Cancer Father    Lung cancer Father    Vision loss Father    Diabetes Maternal Grandmother    Colon cancer Nephew    Colon polyps Neg Hx    Esophageal cancer Neg Hx    Rectal cancer Neg  Hx    Stomach cancer Neg Hx    Breast cancer Neg Hx      Social History   Tobacco Use   Smoking status: Every Day    Current packs/day: 1.00    Average packs/day: 1 pack/day for 53.0 years (53.0 ttl pk-yrs)    Types: Cigarettes   Smokeless tobacco: Never   Tobacco comments:    Counseled that the single most powerful action she could take to decrease her risk of lung cancer is  to quit smoking  Vaping Use   Vaping status: Never Used  Substance Use Topics   Alcohol  use: No   Drug use: No    Allergies as of 03/15/2024 - Review Complete 03/15/2024  Allergen Reaction Noted   Fluoride preparations Nausea Only 11/06/2020    Review of Systems:    All systems reviewed and negative except where noted in HPI.   Physical Exam:  BP 110/82   Pulse 88   Ht 5' 8 (1.727 m)   Wt 183 lb (83 kg)   LMP  (LMP Unknown)   BMI 27.83 kg/m  No LMP recorded (lmp unknown). Patient has had a hysterectomy.  General:   Alert,  Elderly female; pleasant and cooperative in NAD; Walks with a Walker. Lungs:  Respirations even and unlabored.  Barrel chest noted.  Clear throughout to auscultation.   No wheezes, crackles, or rhonchi. No acute distress. Heart:  Regular rate and rhythm; no murmurs, clicks, rubs, or gallops. Abdomen:  Normal bowel sounds.  No bruits.  Soft, No Tenderness.  Neurologic:  Alert and oriented x3;  grossly normal neurologically. Psych:  Alert and cooperative. Normal mood and affect.  Imaging Studies: No results found.  Labs: CBC    Component Value Date/Time   WBC 9.4 03/02/2024 0911   RBC 4.70 03/02/2024 0911   HGB 14.4 03/02/2024 0911   HCT 43.3 03/02/2024 0911   PLT 335.0 03/02/2024 0911   MCV 92.1 03/02/2024 0911    CMP     Component Value Date/Time   NA 144 03/02/2024 0911   NA 142 06/07/2013 0000   K 4.3 03/02/2024 0911   CL 107 03/02/2024 0911   CO2 28 03/02/2024 0911   GLUCOSE 107 (H) 03/02/2024 0911   BUN 17 03/02/2024 0911   BUN 19 06/07/2013 0000    CREATININE 0.52 03/02/2024 0911   CREATININE 0.59 (L) 03/20/2020 0934   CALCIUM  9.4 03/02/2024 0911   PROT 6.0 03/02/2024 0911   ALBUMIN 4.0 03/02/2024 0911   AST 11 03/02/2024 0911   ALT 11 03/02/2024 0911   ALKPHOS 69 03/02/2024 0911   BILITOT 0.4 03/02/2024 0911   GFRNONAA >60 10/16/2021 1131   GFRNONAA 92 03/20/2020 0934   GFRAA 106 03/20/2020 0934    Assessment and Plan:   Samantha Snyder is a 78 y.o. y/o female has been referred for:   1.  History of adenomatous colon polyps - Scheduling 5 year repeat Surveillance Colonoscopy I discussed risks of colonoscopy with patient to include risk of bleeding, colon perforation, and risk of sedation.  Patient expressed understanding and agrees to proceed with colonoscopy.  - Patient was given instructions for 2 day prep (Miralax  day 1, SuPrep day 2).  2.  Comorbidities:  Diabetes type II, emphysema, COPD, tobacco dependence, hypertension diabetic peripheral neuropathy, balance disorder, aortic atherosclerosis, history of subdural hematoma (09/2021).  No current blood thinners. - She appears stable on exam today to undergo colonoscopy in [facility].  3.  Irregular bowel habits - Continue high-fiber diet and 64 ounces of water daily. - I suggested she try OTC fiber supplement daily (after her colonoscopy).  Follow up As Needed based on Colonoscopy results.  Ellouise Console, PA-C

## 2024-03-15 ENCOUNTER — Ambulatory Visit: Admitting: Physician Assistant

## 2024-03-15 ENCOUNTER — Encounter: Payer: Self-pay | Admitting: Physician Assistant

## 2024-03-15 VITALS — BP 110/82 | HR 88 | Ht 68.0 in | Wt 183.0 lb

## 2024-03-15 DIAGNOSIS — R194 Change in bowel habit: Secondary | ICD-10-CM | POA: Diagnosis not present

## 2024-03-15 DIAGNOSIS — Z860100 Personal history of colon polyps, unspecified: Secondary | ICD-10-CM

## 2024-03-15 DIAGNOSIS — Z860101 Personal history of adenomatous and serrated colon polyps: Secondary | ICD-10-CM | POA: Diagnosis not present

## 2024-03-15 MED ORDER — NA SULFATE-K SULFATE-MG SULF 17.5-3.13-1.6 GM/177ML PO SOLN: 1.0000 | Freq: Once | ORAL | 0 refills | Status: AC

## 2024-03-15 NOTE — Patient Instructions (Signed)
 You have been scheduled for a Colonoscopy. Please follow written instructions given to you at your visit today.   If you use inhalers (even only as needed), please bring them with you on the day of your procedure.  DO NOT TAKE 7 DAYS PRIOR TO TEST- Trulicity (dulaglutide) Ozempic , Wegovy (semaglutide ) Mounjaro (tirzepatide) Bydureon Bcise (exanatide extended release)  DO NOT TAKE 1 DAY PRIOR TO YOUR TEST Rybelsus (semaglutide ) Adlyxin (lixisenatide) Victoza (liraglutide) Byetta (exanatide) ___________________________________________________________________________  Please follow up sooner if symptoms increase or worsen   Due to recent changes in healthcare laws, you may see the results of your imaging and laboratory studies on MyChart before your provider has had a chance to review them.  We understand that in some cases there may be results that are confusing or concerning to you. Not all laboratory results come back in the same time frame and the provider may be waiting for multiple results in order to interpret others.  Please give us  48 hours in order for your provider to thoroughly review all the results before contacting the office for clarification of your results.   Thank you for trusting me with your gastrointestinal care!   Ellouise Console, PA-C _______________________________________________________  If your blood pressure at your visit was 140/90 or greater, please contact your primary care physician to follow up on this.  _______________________________________________________  If you are age 76 or older, your body mass index should be between 23-30. Your Body mass index is 27.83 kg/m. If this is out of the aforementioned range listed, please consider follow up with your Primary Care Provider.  If you are age 41 or younger, your body mass index should be between 19-25. Your Body mass index is 27.83 kg/m. If this is out of the aformentioned range listed, please consider  follow up with your Primary Care Provider.   ________________________________________________________  The [facility] providers would like to encourage you to use MYCHART to communicate with providers for non-urgent requests or questions.  Due to long hold times on the telephone, sending your provider a message by [facility] may be a faster and more efficient way to get a response.  Please allow 48 business hours for a response.  Please remember that this is for non-urgent requests.  _______________________________________________________

## 2024-03-24 ENCOUNTER — Encounter: Payer: Self-pay | Admitting: Family Medicine

## 2024-03-29 ENCOUNTER — Telehealth: Payer: Self-pay | Admitting: Internal Medicine

## 2024-03-29 NOTE — Telephone Encounter (Signed)
 Patient states that she knows she will need to hold her metformin  the morning of her test and her ozempic  1 week prior. She is concerned because she takes an eye medication that is a red pill. She is advised that all other medications on her list may be taken as directed as long as it is 3 hours prior to her test. Advised she should just avoid drinking red and purple liquids. Additionally, patient asks if she can take her Fosamax  on Sunday and she is advised she may do this.

## 2024-03-29 NOTE — Telephone Encounter (Signed)
 Inbound call from patient stating she would like to speak to nurse in regards to her upcoming procedure on 05/04/24 and if she has to withhold any medication she is taking. Requesting a call back  Please advise  Thank you

## 2024-04-19 ENCOUNTER — Ambulatory Visit: Payer: Medicare PPO

## 2024-04-19 ENCOUNTER — Other Ambulatory Visit: Payer: Self-pay | Admitting: Family Medicine

## 2024-04-19 VITALS — Ht 68.0 in | Wt 183.0 lb

## 2024-04-19 DIAGNOSIS — Z1231 Encounter for screening mammogram for malignant neoplasm of breast: Secondary | ICD-10-CM

## 2024-04-19 DIAGNOSIS — Z0000 Encounter for general adult medical examination without abnormal findings: Secondary | ICD-10-CM

## 2024-04-19 NOTE — Patient Instructions (Signed)
 Samantha Snyder,  Thank you for taking the time for your Medicare Wellness Visit. I appreciate your continued commitment to your health goals. Please review the care plan we discussed, and feel free to reach out if I can assist you further.  Medicare recommends these wellness visits once per year to help you and your care team stay ahead of potential health issues. These visits are designed to focus on prevention, allowing your provider to concentrate on managing your acute and chronic conditions during your regular appointments.  Please note that Annual Wellness Visits do not include a physical exam. Some assessments may be limited, especially if the visit was conducted virtually. If needed, we may recommend a separate in-person follow-up with your provider.  Ongoing Care Seeing your primary care provider every 3 to 6 months helps us  monitor your health and provide consistent, personalized care.   Referrals If a referral was made during today's visit and you haven't received any updates within two weeks, please contact the referred provider directly to check on the status.  Recommended Screenings:  Health Maintenance  Topic Date Due   Colon Cancer Screening  01/21/2024   Eye exam for diabetics  07/09/2024   Yearly kidney health urinalysis for diabetes  08/28/2024   Complete foot exam   08/28/2024   Hemoglobin A1C  08/30/2024   COVID-19 Vaccine (10 - Pfizer risk 2025-26 season) 09/22/2024   Screening for Lung Cancer  12/23/2024   Yearly kidney function blood test for diabetes  03/02/2025   Medicare Annual Wellness Visit  04/19/2025   DTaP/Tdap/Td vaccine (5 - Td or Tdap) 03/07/2026   Pneumococcal Vaccine for age over 99  Completed   Flu Shot  Completed   DEXA scan (bone density measurement)  Completed   Hepatitis C Screening  Completed   Zoster (Shingles) Vaccine  Completed   Meningitis B Vaccine  Aged Out   Hepatitis B Vaccine  Discontinued   Breast Cancer Screening  Discontinued        04/19/2024    2:33 PM  Advanced Directives  Does Patient Have a Medical Advance Directive? Yes  Type of Estate Agent of [place];Living will  Copy of Healthcare Power of Attorney in Chart? No - copy requested  Would patient like information on creating a medical advance directive? No - Patient declined   Advance Care Planning is important because it: Ensures you receive medical care that aligns with your values, goals, and preferences. Provides guidance to your family and loved ones, reducing the emotional burden of decision-making during critical moments.  Vision: Annual vision screenings are recommended for early detection of glaucoma, cataracts, and diabetic retinopathy. These exams can also reveal signs of chronic conditions such as diabetes and high blood pressure.  Dental: Annual dental screenings help detect early signs of oral cancer, gum disease, and other conditions linked to overall health, including heart disease and diabetes.  Please see the attached documents for additional preventive care recommendations.

## 2024-04-19 NOTE — Progress Notes (Addendum)
 Subjective:   Samantha Snyder is a 78 y.o. who presents for a Medicare Wellness preventive visit.  As a reminder, Annual Wellness Visits don't include a physical exam, and some assessments may be limited, especially if this visit is performed virtually. We may recommend an in-person follow-up visit with your provider if needed.  Visit Complete: Virtual I connected with  Samantha Snyder on 04/19/24 by a audio enabled telemedicine application and verified that I am speaking with the correct person using two identifiers.  Patient Location: Home  Provider Location: Office/Clinic  I discussed the limitations of evaluation and management by telemedicine. The patient expressed understanding and agreed to proceed.  Vital Signs: Because this visit was a virtual/telehealth visit, some criteria may be missing or patient reported. Any vitals not documented were not able to be obtained and vitals that have been documented are patient reported.  VideoDeclined- This patient declined Librarian, academic. Therefore the visit was completed with audio only.  Persons Participating in Visit: Patient.  AWV Questionnaire: Yes: Patient Medicare AWV questionnaire was completed by the patient on 04/15/24; I have confirmed that all information answered by patient is correct and no changes since this date.  Cardiac Risk Factors include: advanced age (>44men, >102 women);diabetes mellitus;dyslipidemia;hypertension     Objective:    Today's Vitals   04/19/24 1426  Weight: 183 lb (83 kg)  Height: 5' 8 (1.727 m)   Body mass index is 27.83 kg/m.     04/19/2024    2:33 PM 11/04/2023   10:37 AM 04/14/2023    1:41 PM 11/21/2022   12:23 PM 04/08/2022    1:32 PM 10/16/2021   11:00 AM 10/05/2021    8:18 PM  Advanced Directives  Does Patient Have a Medical Advance Directive? Yes No Yes No Yes Yes Yes  Type of Estate Agent of [place];Living will  Healthcare  Power of [place];Living will  Healthcare Power of [place];Living will Healthcare Power of [place];Living will Living will;Healthcare Power of Attorney  Does patient want to make changes to medical advance directive?   No - Patient declined  No - Patient declined  No - Patient declined  Copy of Healthcare Power of Attorney in Chart? No - copy requested  Yes - validated most recent copy scanned in chart (See row information)  Yes - validated most recent copy scanned in chart (See row information)    Would patient like information on creating a medical advance directive? No - Patient declined No - Patient declined No - Patient declined No - Patient declined       Current Medications (verified) Outpatient Encounter Medications as of 04/19/2024  Medication Sig   acetaminophen  (TYLENOL ) 500 MG tablet Take 500 mg by mouth in the morning and at bedtime.   Alcohol  Swabs  (DROPSAFE ALCOHOL  PREP) 70 % PADS USE TO CLEANSE ARE BEFORE CHECKING BLOOD SUGAR.   alendronate  (FOSAMAX ) 70 MG tablet TAKE 1 TABLET BY MOUTH EVERY 7 DAYS. TAKE WITH A FULL GLASS OF WATER ON AN EMPTY STOMACH.   atorvastatin  (LIPITOR) 20 MG tablet TAKE 1 TABLET EVERY DAY   Blood Glucose Monitoring Suppl (TRUE METRIX AIR GLUCOSE METER) w/Device KIT USE TO TEST BLOOD SUGARS DAILY   Calcium  Carbonate (CALCIUM  600 PO) Take 1,200 mg by mouth at bedtime.   Cholecalciferol (VITAMIN D3) 2000 UNITS TABS Take 2,000 Units by mouth at bedtime.   diclofenac  Sodium (VOLTAREN ) 1 % GEL APPLY 2 GRAMS TOPICALLY TO PAINFUL SITES OF FEET AND  HANDS 4 TIMES A DAY AS NEEDED   fexofenadine (ALLEGRA) 180 MG tablet Take 180 mg by mouth daily.   lisinopril  (ZESTRIL ) 5 MG tablet TAKE 1 TABLET EVERY DAY   Magnesium 500 MG CAPS Take 500 mg by mouth at bedtime.   metFORMIN  (GLUCOPHAGE ) 1000 MG tablet TAKE 1 TABLET TWICE DAILY WITH MEALS   Multiple Vitamins-Minerals (HAIR SKIN AND NAILS FORMULA PO) Take 2 tablets by mouth daily.   Multiple Vitamins-Minerals  (MULTIVITAMIN PO) Take 1 tablet by mouth at bedtime.   Multiple Vitamins-Minerals (PRESERVISION AREDS 2) CAPS Take by mouth. For macular degeneration twice daily   Omega-3 Fatty Acids (FISH OIL PO) Take 2,000 mg by mouth in the morning and at bedtime.   Probiotic Product (PROBIOTIC DAILY PO) Take 1 capsule by mouth every evening.   Semaglutide ,0.25 or 0.5MG /DOS, (OZEMPIC , 0.25 OR 0.5 MG/DOSE,) 2 MG/3ML SOPN INJECT 0.5MG  AS DIRECTED ONCE WEEKLY   TRUE METRIX BLOOD GLUCOSE TEST test strip TEST BLOOD SUGAR UP TO FOUR TIMES DAILY   TRUEplus Lancets 33G MISC TEST BLOOD SUGAR UP TO FOUR TIMES DAILY   No facility-administered encounter medications on file as of 04/19/2024.    Allergies (verified) Fluoride preparations   History: Past Medical History:  Diagnosis Date   Allergy Teenager   minor hay fever   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 per x ray    History of UTI 2014   per pt noted after taking Bactrim due to tooth infection   Hyperlipidemia    Neuromuscular disorder (HCC) 2010   neuropathy both feet    Osteopenia    Osteoporosis 2022   Osteoporosis   Right adrenal mass    Biopsied in 2008 and benign   Vaginal prolapse    Status post surgery. Improved urinary incontinence   Past Surgical History:  Procedure Laterality Date   ABDOMINAL HYSTERECTOMY  09-07-2013   ABDOMINAL SACROCOLPOPEXY  09/07/2013   BLADDER SURGERY  09/07/2013   vaginal prolapse   BREAST BIOPSY Right    2014? near nipple. benign.   COLONOSCOPY  11/06/2005   sig tics, no polyps-[facility]    CYSTOSCOPY  09/07/2013   ENTEROCELE REPAIR  09/07/2013   INDUCED ABORTION  1985   Therapeutic   LAPAROSCOPIC  BILATERAL SALPINGO OOPHERECTOMY  09/07/2013   POLYPECTOMY     PUBOVAGINAL SLING  09/07/2013   TONSILLECTOMY Bilateral 1955   TUBAL LIGATION  07/31/1984   Family History  Problem Relation Age of Onset   Osteoporosis Mother    Anuerysm Mother    Hearing loss Mother    Cancer Father        Lung but was a smoker   Stroke Father        59 massive lead to death   Diabetes Father    Bladder Cancer Father    Lung cancer Father    Vision loss Father    Diabetes Maternal Grandmother    Colon cancer Nephew    Colon polyps Neg Hx    Esophageal cancer Neg Hx    Rectal cancer Neg Hx    Stomach cancer Neg Hx  Breast cancer Neg Hx    Social History   Socioeconomic History   Marital status: Married    Spouse name: Not on file   Number of children: Not on file   Years of education: Not on file   Highest education level: Bachelor's degree (e.g., BA, AB, BS)  Occupational History   Occupation: retired  Tobacco Use   Smoking status: Every Day    Current packs/day: 1.00    Average packs/day: 1 pack/day for 53.0 years (53.0 ttl pk-yrs)    Types: Cigarettes   Smokeless tobacco: Never   Tobacco comments:    Counseled that the single most powerful action she could take to decrease her risk of lung cancer is to quit smoking  Vaping Use   Vaping status: Never Used  Substance and Sexual Activity   Alcohol  use: No   Drug use: No   Sexual activity: Yes    Birth control/protection: Post-menopausal  Other Topics Concern   Not on file  Social History Narrative   Married 45 years in 2015.  Moved to [place] to be near daughter who lives here- they are having a daughter Erla after age 66 in 17. SABRA Has a son  (60-80 hours a week at cookout) as well and 30 year old grandson- not great contact. 3 granddogs. One dog at home       Retired former  armed forces training and education officer. Has a BS in biology from [employer]. Used to be an algologist-studied algae under microscope      Hobbies-exercising with her  husband      Social Drivers of Corporate Investment Banker Strain: Low Risk  (04/15/2024)   Overall Financial Resource Strain (CARDIA)    Difficulty of Paying Living Expenses: Not hard at all  Food Insecurity: No Food Insecurity (04/15/2024)   Hunger Vital Sign    Worried About Running Out of Food in the Last Year: Never true    Ran Out of Food in the Last Year: Never true  Transportation Needs: No Transportation Needs (04/15/2024)   PRAPARE - Administrator, Civil Service (Medical): No    Lack of Transportation (Non-Medical): No  Physical Activity: Sufficiently Active (04/15/2024)   Exercise Vital Sign    Days of Exercise per Week: 7 days    Minutes of Exercise per Session: 60 min  Recent Concern: Physical Activity - Insufficiently Active (02/27/2024)   Exercise Vital Sign    Days of Exercise per Week: 2 days    Minutes of Exercise per Session: 30 min  Stress: No Stress Concern Present (04/15/2024)   [employer] of Occupational Health - Occupational Stress Questionnaire    Feeling of Stress: Not at all  Social Connections: Moderately Isolated (04/15/2024)   Social Connection and Isolation Panel    Frequency of Communication with Friends and Family: More than three times a week    Frequency of Social Gatherings with Friends and Family: More than three times a week    Attends Religious Services: Never    Database Administrator or Organizations: No    Attends Engineer, Structural: Not on file    Marital Status: Married    Tobacco Counseling Ready to quit: Not Answered Counseling given: Not Answered Tobacco comments: Counseled that the single most powerful action she could take to decrease her risk of lung cancer is to quit smoking    Clinical Intake:  Pre-visit preparation completed: Yes  Pain : No/denies pain     BMI -  recorded: 27.83 Nutritional Status: BMI 25 -29 Overweight Nutritional Risks: None Diabetes: Yes CBG done?: Yes (111 per  pt) CBG resulted in Enter/ Edit results?: No Did pt. bring in CBG monitor from home?: No  Lab Results  Component Value Date   HGBA1C 6.0 (H) 03/02/2024   HGBA1C 6.6 (H) 08/29/2023   HGBA1C 6.5 02/07/2023     How often do you need to have someone help you when you read instructions, pamphlets, or other written materials from your doctor or pharmacy?: 1 - Never  Interpreter Needed?: No  Information entered by :: Ellouise Haws, LPN   Activities of Daily Living     04/15/2024   12:20 PM  In your present state of health, do you have any difficulty performing the following activities:  Hearing? 0  Vision? 0  Difficulty concentrating or making decisions? 0  Walking or climbing stairs? 0  Dressing or bathing? 0  Doing errands, shopping? 0  Preparing Food and eating ? N  Using the Toilet? N  In the past six months, have you accidently leaked urine? N  Do you have problems with loss of bowel control? N  Managing your Medications? N  Managing your Finances? N  Housekeeping or managing your Housekeeping? N    Patient Care Team: Katrinka Garnette KIDD, MD as PCP - General (Family Medicine) Vicci Mcardle, OD as Consulting Physician (Optometry) Marquette Ozell BIRCH, DO as Consulting Physician (Sports Medicine)  I have updated your Care Teams any recent Medical Services you may have received from other providers in the past year.     Assessment:   This is a routine wellness examination for Samantha Snyder.  Hearing/Vision screen Hearing Screening - Comments:: Pt denies any hearing issues  Vision Screening - Comments:: Wears rx glasses - [facility]    Goals Addressed             This Visit's Progress    Patient Stated       Lose a little weight        Depression Screen     04/19/2024    2:30 PM 03/02/2024    8:29 AM 04/14/2023    1:39 PM 02/07/2023   10:20 AM 04/08/2022    1:35 PM 03/30/2021    8:16 AM 03/26/2021    1:06 PM  PHQ 2/9 Scores   PHQ - 2 Score 0 0 0 0 0 0 0  PHQ- 9 Score 0 0 0 0       Fall Risk     04/15/2024   12:20 PM 03/02/2024    8:29 AM 10/24/2023    8:13 AM 04/10/2023    2:25 PM 04/08/2022    1:30 PM  Fall Risk   Falls in the past year? 1 1 1 1 1   Number falls in past yr: 0 1 1 1 1   Injury with Fall? 1 0 1 0 1  Comment concusion    hematoma from fall was seen in ER  Risk for fall due to : History of fall(s);Impaired balance/gait;Impaired mobility Impaired balance/gait;Impaired mobility History of fall(s) Impaired vision;Impaired mobility Impaired vision  Follow up Falls prevention discussed Falls evaluation completed Falls evaluation completed Falls prevention discussed Falls prevention discussed      Data saved with a previous flowsheet row definition    MEDICARE RISK AT HOME:  Medicare Risk at Home Any stairs in or around the home?: (Patient-Rptd) No If so, are there any without handrails?: (  Patient-Rptd) No Home free of loose throw rugs in walkways, pet beds, electrical cords, etc?: (Patient-Rptd) Yes Adequate lighting in your home to reduce risk of falls?: (Patient-Rptd) Yes Life alert?: (Patient-Rptd) No Use of a cane, walker or w/c?: (Patient-Rptd) Yes Grab bars in the bathroom?: (Patient-Rptd) Yes Shower chair or bench in shower?: (Patient-Rptd) No Elevated toilet seat or a handicapped toilet?: (Patient-Rptd) No  TIMED UP AND GO:  Was the test performed?  No  Cognitive Function: 6CIT completed    03/12/2018   10:16 AM 03/11/2017   10:48 AM  MMSE - Mini Mental State Exam  Not completed: -- --        04/19/2024    2:33 PM 04/14/2023    1:42 PM 04/08/2022    1:37 PM 03/26/2021    1:12 PM 03/16/2020    9:58 AM  6CIT Screen  What Year? 0 points 0 points 0 points 0 points 0 points  What month? 0 points 0 points 0 points 0 points 0 points  What time? 0 points 0 points 0 points 0 points   Count back from 20 0 points 0 points 0 points 0 points 0 points  Months in reverse 0 points 0  points 0 points 0 points 0 points  Repeat phrase 0 points 0 points 0 points 0 points 0 points  Total Score 0 points 0 points 0 points 0 points     Immunizations Immunization History  Administered Date(s) Administered   Fluad Quad(high Dose 65+) 03/15/2019, 03/20/2020, 03/30/2021, 04/17/2022   Hepatitis A 02/28/1948   Hepatitis B 03/28/1947   INFLUENZA, HIGH DOSE SEASONAL PF 03/17/2014, 03/07/2016, 03/11/2017, 03/12/2018, 02/25/2023, 03/24/2024   Influenza,inj,Quad PF,6+ Mos 03/02/2015   Influenza-Unspecified 03/10/2010, 02/25/2012, 03/17/2013   MMR 05/14/1990, 05/14/1992   Moderna Covid-19 Fall Seasonal Vaccine 40yrs & older 04/05/2022   PFIZER Comirnaty(Gray Top)Covid-19 Tri-Sucrose Vaccine 02/25/2023   PFIZER(Purple Top)SARS-COV-2 Vaccination 08/15/2019, 09/08/2019, 04/23/2020, 10/25/2020, 11/02/2021   PNEUMOCOCCAL CONJUGATE-20 08/29/2023   Pfizer Covid-19 Vaccine Bivalent Booster 92yrs & up 03/06/2021   Pfizer(Comirnaty)Fall Seasonal Vaccine 12 years and older 03/24/2024   Pneumococcal Conjugate-13 03/17/2013, 06/07/2013   Pneumococcal Polysaccharide-23 09/19/2005, 02/24/2014   Pneumococcal-Unspecified 09/19/2005   Respiratory Syncytial Virus Vaccine,Recomb Aduvanted(Arexvy) 05/08/2022   Td 02/28/1950, 02/29/1956, 09/19/2005, 03/07/2016   Tdap 02/29/1960   Varicella 10/19/1991   Zoster Recombinant(Shingrix) 10/28/2016, 01/30/2017   Zoster, Live 02/28/2009    Screening Tests Health Maintenance  Topic Date Due   Colonoscopy  01/21/2024   OPHTHALMOLOGY EXAM  07/09/2024   Diabetic kidney evaluation - Urine ACR  08/28/2024   FOOT EXAM  08/28/2024   HEMOGLOBIN A1C  08/30/2024   COVID-19 Vaccine (10 - Pfizer risk 2025-26 season) 09/22/2024   Lung Cancer Screening  12/23/2024   Diabetic kidney evaluation - eGFR measurement  03/02/2025   Medicare Annual Wellness (AWV)  04/19/2025   DTaP/Tdap/Td (5 - Td or Tdap) 03/07/2026   Pneumococcal Vaccine: 50+ Years  Completed    Influenza Vaccine  Completed   DEXA SCAN  Completed   Hepatitis C Screening  Completed   Zoster Vaccines- Shingrix  Completed   Meningococcal B Vaccine  Aged Out   Hepatitis B Vaccines 19-59 Average Risk  Discontinued   Mammogram  Discontinued    Health Maintenance Items Addressed: See Nurse Notes at the end of this note  Additional Screening:  Vision Screening: Recommended annual ophthalmology exams for early detection of glaucoma and other disorders of the eye. Is the patient up to date with their annual eye  exam?  Yes  Who is the provider or what is the name of the office in which the patient attends [facility] eye exams[facility]   Dental Screening: Recommended annual dental exams for proper oral Nurse, Mental Health Referral / Chronic Care Management: CRR required this visit?  No   CCM required this visit?  No   Plan:    I have personally reviewed and noted the following in the patient's chart:   Medical and social history Use of alcohol , tobacco or illicit drugs  Current medications and supplements including opioid prescriptions. Patient is not currently taking opioid prescriptions. Functional ability and status Nutritional status Physical activity Advanced directives List of other physicians Hospitalizations, surgeries, and ER visits in previous 12 months Vitals Screenings to include cognitive, depression, and falls Referrals and appointments  In addition, I have reviewed and discussed with patient certain preventive protocols, quality metrics, and best practice recommendations. A written personalized care plan for preventive services as well as general preventive health recommendations were provided to patient.   Ellouise VEAR Haws, LPN   [id number]/72/[id number]   After Visit Summary: (MyChart) Due to this being a telephonic visit, the after visit summary with patients personalized plan was offered to patient via MyChart   Notes: Nothing significant to report at  this time. Colonoscopy scheduled 05/04/24

## 2024-04-27 ENCOUNTER — Encounter: Payer: Self-pay | Admitting: Internal Medicine

## 2024-05-03 ENCOUNTER — Telehealth: Payer: Self-pay | Admitting: Internal Medicine

## 2024-05-03 NOTE — Telephone Encounter (Signed)
 Patient called stating she is schedule for colonoscopy tomorrow 11/11 and spoke with her insurance this morning and they have not received prior authorization. Patient requesting a call. Please advise, thank you

## 2024-05-04 ENCOUNTER — Ambulatory Visit: Admitting: Internal Medicine

## 2024-05-04 ENCOUNTER — Encounter: Payer: Self-pay | Admitting: Internal Medicine

## 2024-05-04 VITALS — BP 114/61 | HR 69 | Temp 98.2°F | Resp 16 | Ht 68.0 in | Wt 183.0 lb

## 2024-05-04 DIAGNOSIS — Z860101 Personal history of adenomatous and serrated colon polyps: Secondary | ICD-10-CM

## 2024-05-04 DIAGNOSIS — K5730 Diverticulosis of large intestine without perforation or abscess without bleeding: Secondary | ICD-10-CM

## 2024-05-04 DIAGNOSIS — Z1211 Encounter for screening for malignant neoplasm of colon: Secondary | ICD-10-CM | POA: Diagnosis not present

## 2024-05-04 DIAGNOSIS — D122 Benign neoplasm of ascending colon: Secondary | ICD-10-CM

## 2024-05-04 DIAGNOSIS — E119 Type 2 diabetes mellitus without complications: Secondary | ICD-10-CM | POA: Diagnosis not present

## 2024-05-04 DIAGNOSIS — K635 Polyp of colon: Secondary | ICD-10-CM | POA: Diagnosis not present

## 2024-05-04 DIAGNOSIS — D120 Benign neoplasm of cecum: Secondary | ICD-10-CM | POA: Diagnosis not present

## 2024-05-04 DIAGNOSIS — D125 Benign neoplasm of sigmoid colon: Secondary | ICD-10-CM | POA: Diagnosis not present

## 2024-05-04 DIAGNOSIS — J439 Emphysema, unspecified: Secondary | ICD-10-CM | POA: Diagnosis not present

## 2024-05-04 DIAGNOSIS — Z860100 Personal history of colon polyps, unspecified: Secondary | ICD-10-CM

## 2024-05-04 MED ORDER — SODIUM CHLORIDE 0.9 % IV SOLN: 500.0000 mL | Freq: Once | INTRAVENOUS | Status: DC

## 2024-05-04 NOTE — Patient Instructions (Addendum)
-  Handout on polyps, hemorrhoids and diverticulosis provided -Await pathology results  YOU HAD AN ENDOSCOPIC PROCEDURE TODAY AT THE [facility]:   Refer to the procedure report that was given to you for any specific questions about what was found during the examination.  If the procedure report does not answer your questions, please call your gastroenterologist to clarify.  If you requested that your care partner not be given the details of your procedure findings, then the procedure report has been included in a sealed envelope for you to review at your convenience later.  YOU SHOULD EXPECT: Some feelings of bloating in the abdomen. Passage of more gas than usual.  Walking can help get rid of the air that was put into your GI tract during the procedure and reduce the bloating. If you had a lower endoscopy (such as a colonoscopy or flexible sigmoidoscopy) you may notice spotting of blood in your stool or on the toilet paper. If you underwent a bowel prep for your procedure, you may not have a normal bowel movement for a few days.  Please Note:  You might notice some irritation and congestion in your nose or some drainage.  This is from the oxygen used during your procedure.  There is no need for concern and it should clear up in a day or so.  SYMPTOMS TO REPORT IMMEDIATELY:  Following lower endoscopy (colonoscopy or flexible sigmoidoscopy):  Excessive amounts of blood in the stool  Significant tenderness or worsening of abdominal pains  Swelling of the abdomen that is new, acute  Fever of 100F or higher  For urgent or emergent issues, a gastroenterologist can be reached at any hour by calling [phone]. Do not use MyChart messaging for urgent concerns.    DIET:  We do recommend a small meal at first, but then you may proceed to your regular diet.  Drink plenty of fluids but you should avoid alcoholic beverages for 24 hours.  ACTIVITY:  You should plan to take it easy for the  rest of today and you should NOT DRIVE or use heavy machinery until tomorrow (because of the sedation medicines used during the test).    FOLLOW UP: Our staff will call the number listed on your records the next business day following your procedure.  We will call around 7:15- 8:00 am to check on you and address any questions or concerns that you may have regarding the information given to you following your procedure. If we do not reach you, we will leave a message.     If any biopsies were taken you will be contacted by phone or by letter within the next 1-3 weeks.  Please call us  at [phone] if you have not heard about the biopsies in 3 weeks.    SIGNATURES/CONFIDENTIALITY: You and/or your care partner have signed paperwork which will be entered into your electronic medical record.  These signatures attest to the fact that that the information above on your After Visit Summary has been reviewed and is understood.  Full responsibility of the confidentiality of this discharge information lies with you and/or your care-partner.

## 2024-05-04 NOTE — Progress Notes (Signed)
 Called to room to assist during endoscopic procedure.  Patient ID and intended procedure confirmed with present staff. Received instructions for my participation in the procedure from the performing physician.

## 2024-05-04 NOTE — Op Note (Signed)
 [facility] Patient Name: Samantha Snyder Procedure Date: 05/04/2024 1:43 PM MRN: [id number] Endoscopist: Gordy CHRISTELLA Starch , MD, [id number] Age: 78 Referring MD:  Date of Birth: [birth year] Gender: Female Account #: [id number] Procedure:                Colonoscopy Indications:              High risk colon cancer surveillance: Personal                            history of multiple adenomas, Last colonoscopy:                            July 2020 (TA x2), July (TA x 4) Medicines:                Monitored Anesthesia Care Procedure:                Pre-Anesthesia Assessment:                           - Prior to the procedure, a History and Physical                            was performed, and patient medications and                            allergies were reviewed. The patient's tolerance of                            previous anesthesia was also reviewed. The risks                            and benefits of the procedure and the sedation                            options and risks were discussed with the patient.                            All questions were answered, and informed consent                            was obtained. Prior Anticoagulants: The patient has                            taken no anticoagulant or antiplatelet agents. ASA                            Grade Assessment: II - A patient with mild systemic                            disease. After reviewing the risks and benefits,                            the patient was deemed in satisfactory condition to  undergo the procedure.                           After obtaining informed consent, the colonoscope                            was passed under direct vision. Throughout the                            procedure, the patient's blood pressure, pulse, and                            oxygen saturations were monitored continuously. The                            Olympus Scope SN [id number] was  introduced through the                            anus and advanced to the cecum, identified by                            appendiceal orifice and ileocecal valve. The                            colonoscopy was performed without difficulty. The                            patient tolerated the procedure well. The quality                            of the bowel preparation was good. The ileocecal                            valve, appendiceal orifice, and rectum were                            photographed. Scope In: 1:46:51 PM Scope Out: 2:13:33 PM Scope Withdrawal Time: 0 hours 20 minutes 11 seconds  Total Procedure Duration: 0 hours 26 minutes 42 seconds  Findings:                 The digital rectal exam was normal.                           A 4 mm polyp was found in the cecum. The polyp was                            sessile. The polyp was removed with a cold snare.                            Resection and retrieval were complete.                           A 4 mm polyp was found in the ascending colon. The  polyp was sessile. The polyp was removed with a                            cold snare. Resection and retrieval were complete.                           A 3 mm polyp was found in the sigmoid colon. The                            polyp was sessile. The polyp was removed with a                            cold snare. Resection and retrieval were complete.                           Many medium-mouthed and small-mouthed diverticula                            were found from sigmoid to ascending colon.                           Internal hemorrhoids were found during                            retroflexion. The hemorrhoids were small. Complications:            No immediate complications. Estimated Blood Loss:     Estimated blood loss: none. Impression:               - One 4 mm polyp in the cecum, removed with a cold                            snare. Resected and  retrieved.                           - One 4 mm polyp in the ascending colon, removed                            with a cold snare. Resected and retrieved.                           - One 3 mm polyp in the sigmoid colon, removed with                            a cold snare. Resected and retrieved.                           - Severe diverticulosis from sigmoid to ascending                            colon.                           - Small internal hemorrhoids. Recommendation:           -  Patient has a contact number available for                            emergencies. The signs and symptoms of potential                            delayed complications were discussed with the                            patient. Return to normal activities tomorrow.                            Written discharge instructions were provided to the                            patient.                           - Resume previous diet.                           - Continue present medications.                           - Await pathology results.                           - No recommendation at this time regarding repeat                            colonoscopy due to age. Gordy CHRISTELLA Starch, MD 05/04/2024 2:17:54 PM This report has been signed electronically.

## 2024-05-04 NOTE — Progress Notes (Signed)
 Report to PACU, RN, vss, BBS= Clear.

## 2024-05-04 NOTE — Progress Notes (Signed)
 GASTROENTEROLOGY PROCEDURE H&P NOTE   Primary Care Physician: Katrinka Garnette KIDD, MD    Reason for Procedure:  History of colonic polyps  Plan:    Colonoscopy  Patient is appropriate for endoscopic procedure(s) in the ambulatory ([facility]) setting.  The nature of the procedure, as well as the risks, benefits, and alternatives were carefully and thoroughly reviewed with the patient. Ample time for discussion and questions allowed.  All questions were answered. The patient understood, was satisfied, and agreed with the plan to proceed.    HPI: Samantha Snyder is a 78 y.o. female who presents for surveillance colonoscopy.  Medical history as below.  Tolerated the prep.  No recent chest pain or shortness of breath.  No abdominal pain today.  Past Medical History:  Diagnosis Date   Allergy Teenager   minor hay fever   Arthritis 2020   hands x 2, knee    Cataract 2022   small   Chicken pox 02/24/2014   Has had shingles vaccine   Chronic diarrhea    For one year-needed prolonged course of antibiotics   Concussion 09/2023   Diabetes mellitus without complication (HCC) 06/06/2003   Type 2   Diverticulosis 02/24/2014   Emphysema of lung (HCC) 2023   mild per pt. 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 Improved urinary incontinence    Past Surgical History:  Procedure Laterality Date   ABDOMINAL HYSTERECTOMY  09-07-2013   ABDOMINAL SACROCOLPOPEXY  09/07/2013   BLADDER SURGERY  09/07/2013   vaginal prolapse   BREAST BIOPSY Right    2014? near nipple. benign.   COLONOSCOPY  11/06/2005   sig tics, no polyps-[facility]    CYSTOSCOPY  09/07/2013   ENTEROCELE  REPAIR  09/07/2013   INDUCED ABORTION  1985   Therapeutic   LAPAROSCOPIC BILATERAL SALPINGO OOPHERECTOMY  09/07/2013   POLYPECTOMY     PUBOVAGINAL SLING  09/07/2013   TOE AMPUTATION Left 2021   TONSILLECTOMY Bilateral 1955   TUBAL LIGATION  07/31/1984    Prior to Admission medications   Medication Sig Start Date End Date Taking? Authorizing Provider  acetaminophen  (TYLENOL ) 500 MG tablet Take 500 mg by mouth in the morning and at bedtime.   Yes [provider]  Alcohol  Swabs  (DROPSAFE ALCOHOL  PREP) 70 % PADS USE TO CLEANSE ARE BEFORE CHECKING BLOOD SUGAR. 03/02/24  Yes Katrinka Garnette KIDD, MD  alendronate  (FOSAMAX ) 70 MG tablet TAKE 1 TABLET BY MOUTH EVERY 7 DAYS. TAKE WITH A FULL GLASS OF WATER ON AN EMPTY STOMACH. 07/17/23  Yes Katrinka Garnette KIDD, MD  atorvastatin  (LIPITOR) 20 MG tablet TAKE 1 TABLET EVERY DAY 11/10/23  Yes Katrinka Garnette KIDD, MD  Blood Glucose Monitoring Suppl (TRUE METRIX AIR GLUCOSE METER) w/Device KIT USE TO TEST BLOOD SUGARS DAILY 08/29/22  Yes Katrinka Garnette KIDD, MD  Calcium  Carbonate (CALCIUM  600 PO) Take 1,200 mg by mouth at bedtime.   Yes [provider]  Cholecalciferol (VITAMIN D3) 2000 UNITS TABS Take 2,000 Units by mouth at bedtime.   Yes [provider]  diclofenac  Sodium (VOLTAREN ) 1 %  GEL APPLY 2 GRAMS TOPICALLY TO PAINFUL SITES OF FEET AND HANDS 4 TIMES A DAY AS NEEDED 07/21/23  Yes Katrinka Garnette KIDD, MD  fexofenadine (ALLEGRA) 180 MG tablet Take 180 mg by mouth daily.   Yes [provider]  lisinopril  (ZESTRIL ) 5 MG tablet TAKE 1 TABLET EVERY DAY 11/10/23  Yes Katrinka Garnette KIDD, MD  Magnesium 500 MG CAPS Take 500 mg by mouth at bedtime.   Yes [provider]  metFORMIN  (GLUCOPHAGE ) 1000 MG tablet TAKE 1 TABLET TWICE DAILY WITH MEALS 11/10/23  Yes Katrinka Garnette KIDD, MD  Multiple Vitamins-Minerals (HAIR SKIN AND NAILS FORMULA PO) Take 2 tablets by mouth daily.   Yes [provider]  Multiple Vitamins-Minerals  (MULTIVITAMIN PO) Take 1 tablet by mouth at bedtime.   Yes [provider]  Multiple Vitamins-Minerals (PRESERVISION AREDS 2) CAPS Take by mouth. For macular degeneration twice daily   Yes [provider]  Omega-3 Fatty Acids (FISH OIL PO) Take 2,000 mg by mouth in the morning and at bedtime.   Yes [provider]  Probiotic Product (PROBIOTIC DAILY PO) Take 1 capsule by mouth every evening.   Yes [provider]  TRUE METRIX BLOOD GLUCOSE TEST test strip TEST BLOOD SUGAR UP TO FOUR TIMES DAILY 05/01/23  Yes Katrinka Garnette KIDD, MD  TRUEplus Lancets 33G MISC TEST BLOOD SUGAR UP TO FOUR TIMES DAILY 02/24/24  Yes Katrinka Garnette KIDD, MD  Semaglutide ,0.25 or 0.5MG /DOS, (OZEMPIC , 0.25 OR 0.5 MG/DOSE,) 2 MG/3ML SOPN INJECT 0.5MG  AS DIRECTED ONCE WEEKLY 08/11/23   Katrinka Garnette KIDD, MD    Current Outpatient Medications  Medication Sig Dispense Refill   acetaminophen  (TYLENOL ) 500 MG tablet Take 500 mg by mouth in the morning and at bedtime.     Alcohol  Swabs  (DROPSAFE ALCOHOL  PREP) 70 % PADS USE TO CLEANSE ARE BEFORE CHECKING BLOOD SUGAR. 400 each 3   alendronate  (FOSAMAX ) 70 MG tablet TAKE 1 TABLET BY MOUTH EVERY 7 DAYS. TAKE WITH A FULL GLASS OF WATER ON AN EMPTY STOMACH. 12 tablet 3   atorvastatin  (LIPITOR) 20 MG tablet TAKE 1 TABLET EVERY DAY 90 tablet 3   Blood Glucose Monitoring Suppl (TRUE METRIX AIR GLUCOSE METER) w/Device KIT USE TO TEST BLOOD SUGARS DAILY 1 kit 3   Calcium  Carbonate (CALCIUM  600 PO) Take 1,200 mg by mouth at bedtime.     Cholecalciferol (VITAMIN D3) 2000 UNITS TABS Take 2,000 Units by mouth at bedtime.     diclofenac  Sodium (VOLTAREN ) 1 % GEL APPLY 2 GRAMS TOPICALLY TO PAINFUL SITES OF FEET AND HANDS 4 TIMES A DAY AS NEEDED 400 g 5   fexofenadine (ALLEGRA) 180 MG tablet Take 180 mg by mouth daily.     lisinopril  (ZESTRIL ) 5 MG tablet TAKE 1 TABLET EVERY DAY 90 tablet 3   Magnesium 500 MG CAPS Take 500 mg by mouth at bedtime.     metFORMIN   (GLUCOPHAGE ) 1000 MG tablet TAKE 1 TABLET TWICE DAILY WITH MEALS 180 tablet 3   Multiple Vitamins-Minerals (HAIR SKIN AND NAILS FORMULA PO) Take 2 tablets by mouth daily.     Multiple Vitamins-Minerals (MULTIVITAMIN PO) Take 1 tablet by mouth at bedtime.     Multiple Vitamins-Minerals (PRESERVISION AREDS 2) CAPS Take by mouth. For macular degeneration twice daily     Omega-3 Fatty Acids (FISH OIL PO) Take 2,000 mg by mouth in the morning and at bedtime.     Probiotic Product (PROBIOTIC DAILY PO) Take 1 capsule by mouth every evening.  TRUE METRIX BLOOD GLUCOSE TEST test strip TEST BLOOD SUGAR UP TO FOUR TIMES DAILY 350 strip 3   TRUEplus Lancets 33G MISC TEST BLOOD SUGAR UP TO FOUR TIMES DAILY 400 each 3   Semaglutide ,0.25 or 0.5MG /DOS, (OZEMPIC , 0.25 OR 0.5 MG/DOSE,) 2 MG/3ML SOPN INJECT 0.5MG  AS DIRECTED ONCE WEEKLY 9 mL 3   Current Facility-Administered Medications  Medication Dose Route Frequency Provider Last Rate Last Admin   0.9 %  sodium chloride  infusion  500 mL Intravenous Once Eliah Ozawa, Gordy HERO, MD        Allergies as of 05/04/2024 - Review Complete 05/04/2024  Allergen Reaction Noted   Fluoride preparations Nausea Only 11/06/2020    Family History  Problem Relation Age of Onset   Osteoporosis Mother    Anuerysm Mother    Hearing loss Mother    Cancer Father        Lung but was a smoker   Stroke Father        68 massive lead to death   Diabetes Father    Bladder Cancer Father    Lung cancer Father    Vision loss Father    Diabetes Maternal Grandmother    Colon cancer Nephew    Colon polyps Neg Hx    Esophageal cancer Neg Hx    Rectal cancer Neg Hx    Stomach cancer Neg Hx    Breast cancer Neg Hx     Social History   Socioeconomic History   Marital status: Married    Spouse name: Not on file   Number of children: Not on file   Years of education: Not on file   Highest education level: Bachelor's degree (e.g., BA, AB, BS)  Occupational History   Occupation:  retired  Tobacco Use   Smoking status: Every Day    Current packs/day: 1.00    Average packs/day: 1 pack/day for 53.0 years (53.0 ttl pk-yrs)    Types: Cigarettes   Smokeless tobacco: Never   Tobacco comments:    Counseled that the single most powerful action she could take to decrease her risk of lung cancer is to quit smoking  Vaping Use   Vaping status: Never Used  Substance and Sexual Activity   Alcohol  use: No   Drug use: No   Sexual activity: Yes    Birth control/protection: Post-menopausal  Other Topics Concern   Not on file  Social History Narrative   Married 45 years in 2015.  Moved to [place] to be near daughter who lives here- they are having a daughter Erla after age 34 in 9. SABRA Has a son  (60-80 hours a week at cookout) as well and 82 year old grandson- not great contact. 3 granddogs. One dog at home       Retired former  armed forces training and education officer. Has a BS in biology from [employer]. Used to be an algologist-studied algae under microscope      Hobbies-exercising with her husband      Social Drivers of Corporate Investment Banker Strain: Low Risk  (04/15/2024)   Overall Financial Resource Strain (CARDIA)    Difficulty of Paying Living Expenses: Not hard at all  Food Insecurity: No Food Insecurity (04/15/2024)   Hunger Vital Sign    Worried About Running Out of Food in the Last Year: Never true    Ran Out of Food in the Last Year: Never true  Transportation Needs: No Transportation Needs (04/15/2024)   PRAPARE - Transportation    Lack of  Transportation (Medical): No    Lack of Transportation (Non-Medical): No  Physical Activity: Sufficiently Active (04/15/2024)   Exercise Vital Sign    Days of Exercise per Week: 7 days    Minutes of Exercise per Session: 60 min  Recent Concern: Physical Activity - Insufficiently Active (02/27/2024)   Exercise Vital Sign    Days of Exercise per Week: 2 days    Minutes of Exercise per Session: 30 min  Stress: No Stress Concern  Present (04/15/2024)   [employer] of Occupational Health - Occupational Stress Questionnaire    Feeling of Stress: Not at all  Social Connections: Moderately Isolated (04/15/2024)   Social Connection and Isolation Panel    Frequency of Communication with Friends and Family: More than three times a week    Frequency of Social Gatherings with Friends and Family: More than three times a week    Attends Religious Services: Never    Database Administrator or Organizations: No    Attends Engineer, Structural: Not on file    Marital Status: Married  Catering Manager Violence: Not At Risk (04/19/2024)   Humiliation, Afraid, Rape, and Kick questionnaire    Fear of Current or Ex-Partner: No    Emotionally Abused: No    Physically Abused: No    Sexually Abused: No    Physical Exam: Vital signs in last 24 hours: @BP  132/80   Pulse 96   Temp 98.2 F (36.8 C) (Temporal)   Ht 5' 8 (1.727 m)   Wt 183 lb (83 kg)   LMP  (LMP Unknown)   SpO2 97%   BMI 27.83 kg/m  GEN: NAD EYE: Sclerae anicteric ENT: MMM CV: Non-tachycardic Pulm: CTA b/l GI: Soft, NT/ND NEURO:  Alert & Oriented x 3   Gordy Starch, MD [facility]  05/04/2024 1:36 PM

## 2024-05-05 ENCOUNTER — Telehealth: Payer: Self-pay | Admitting: *Deleted

## 2024-05-05 NOTE — Telephone Encounter (Signed)
  Follow up Call-     05/04/2024    1:08 PM  Call back number  Post procedure Call Back phone  # [phone]  Permission to leave phone message Yes     Patient questions:  Do you have a fever, pain , or abdominal swelling? No. Pain Score  0 *  Have you tolerated food without any problems? Yes.    Have you been able to return to your normal activities? Yes.    Do you have any questions about your discharge instructions: Diet   No. Medications  No. Follow up visit  No.  Do you have questions or concerns about your Care? No.  Actions: * If pain score is 4 or above: No action needed, pain <4.

## 2024-05-10 LAB — SURGICAL PATHOLOGY

## 2024-05-17 ENCOUNTER — Ambulatory Visit: Payer: Self-pay | Admitting: Internal Medicine

## 2024-06-06 ENCOUNTER — Other Ambulatory Visit: Payer: Self-pay | Admitting: Family Medicine

## 2024-06-08 ENCOUNTER — Ambulatory Visit
Admission: RE | Admit: 2024-06-08 | Discharge: 2024-06-08 | Disposition: A | Source: Ambulatory Visit | Attending: Family Medicine | Admitting: Family Medicine

## 2024-06-08 DIAGNOSIS — Z1231 Encounter for screening mammogram for malignant neoplasm of breast: Secondary | ICD-10-CM

## 2024-06-12 ENCOUNTER — Other Ambulatory Visit: Payer: Self-pay | Admitting: Family Medicine

## 2024-07-13 LAB — OPHTHALMOLOGY REPORT-SCANNED

## 2024-10-06 ENCOUNTER — Encounter: Admitting: Family Medicine

## 2025-05-02 ENCOUNTER — Ambulatory Visit
